# Patient Record
Sex: Male | Born: 1950 | Race: Black or African American | Hispanic: No | Smoking: Current every day smoker
Health system: Southern US, Community
[De-identification: ages and names within clinical notes are randomized; demographics above are authoritative.]

## PROBLEM LIST (undated history)

## (undated) DIAGNOSIS — R569 Unspecified convulsions: Secondary | ICD-10-CM

## (undated) DIAGNOSIS — R0600 Dyspnea, unspecified: Secondary | ICD-10-CM

## (undated) DIAGNOSIS — J449 Chronic obstructive pulmonary disease, unspecified: Secondary | ICD-10-CM

## (undated) DIAGNOSIS — F0390 Unspecified dementia without behavioral disturbance: Secondary | ICD-10-CM

## (undated) DIAGNOSIS — C801 Malignant (primary) neoplasm, unspecified: Secondary | ICD-10-CM

---

## 2003-04-03 ENCOUNTER — Inpatient Hospital Stay (HOSPITAL_COMMUNITY): Admission: EM | Admit: 2003-04-03 | Discharge: 2003-04-06 | Payer: Self-pay | Admitting: Internal Medicine

## 2003-04-03 ENCOUNTER — Encounter: Payer: Self-pay | Admitting: Internal Medicine

## 2004-04-10 ENCOUNTER — Observation Stay (HOSPITAL_COMMUNITY): Admission: EM | Admit: 2004-04-10 | Discharge: 2004-04-11 | Payer: Self-pay | Admitting: Emergency Medicine

## 2007-11-06 ENCOUNTER — Emergency Department (HOSPITAL_COMMUNITY): Admission: EM | Admit: 2007-11-06 | Discharge: 2007-11-06 | Payer: Self-pay | Admitting: Emergency Medicine

## 2008-08-05 ENCOUNTER — Emergency Department (HOSPITAL_COMMUNITY): Admission: EM | Admit: 2008-08-05 | Discharge: 2008-08-05 | Payer: Self-pay | Admitting: Emergency Medicine

## 2008-10-21 ENCOUNTER — Inpatient Hospital Stay (HOSPITAL_COMMUNITY): Admission: EM | Admit: 2008-10-21 | Discharge: 2008-10-31 | Payer: Self-pay | Admitting: Emergency Medicine

## 2009-10-30 IMAGING — US US CAROTID DUPLEX BILAT
1 series · 13 of 24 positions shown · non-contrast
Comparison: None

CLINICAL DATA: RIGHT CAROTID BRUIT, HISTORY SMOKING, CORONARY
ARTERY DISEASE, DIABETES

BILATERAL CAROTID DUPLEX ULTRASOUND
TECHNIQUE: Gray scale imaging, color Doppler and duplex ultrasound
was performed of bilateral carotid systems and vertebral arteries
in the neck.

[Series 1: unknown · 0.06mm/px · 13 of 63 slices shown]
[im 1/63]
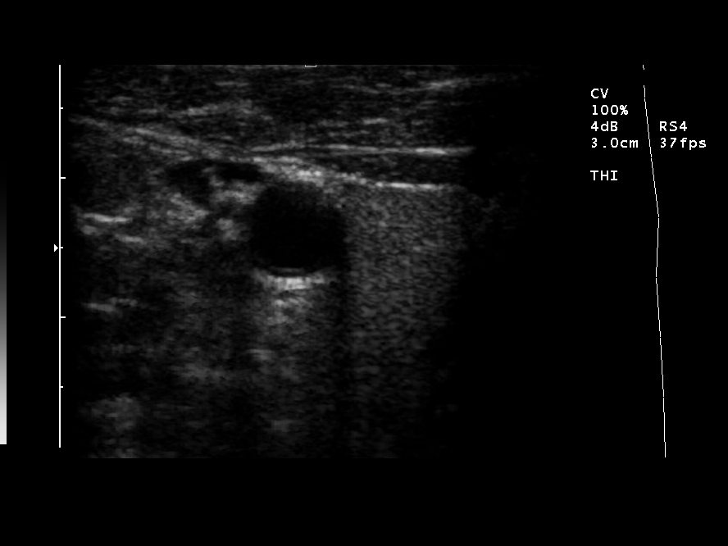
[im 6/63]
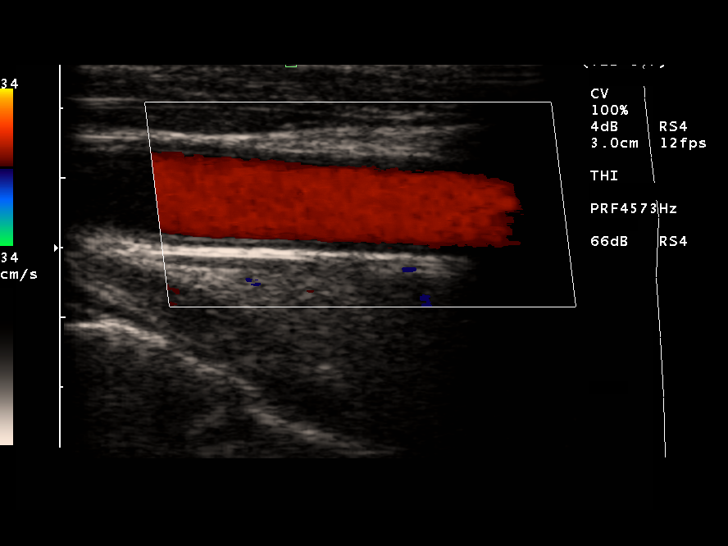
[im 11/63]
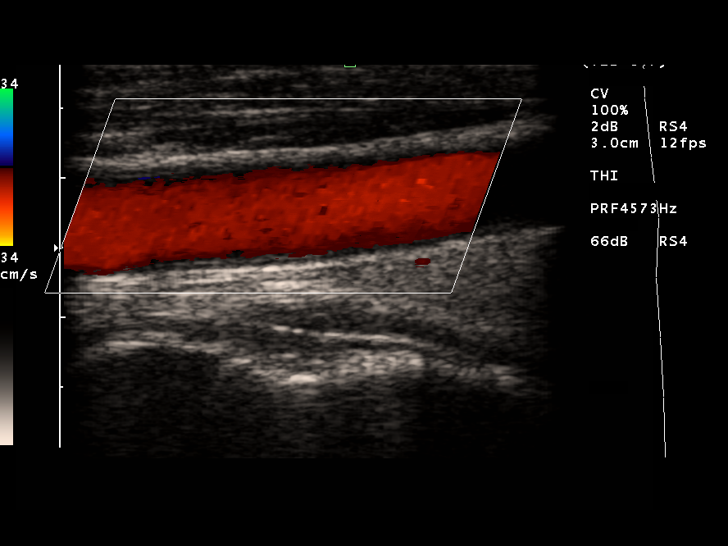
[im 17/63]
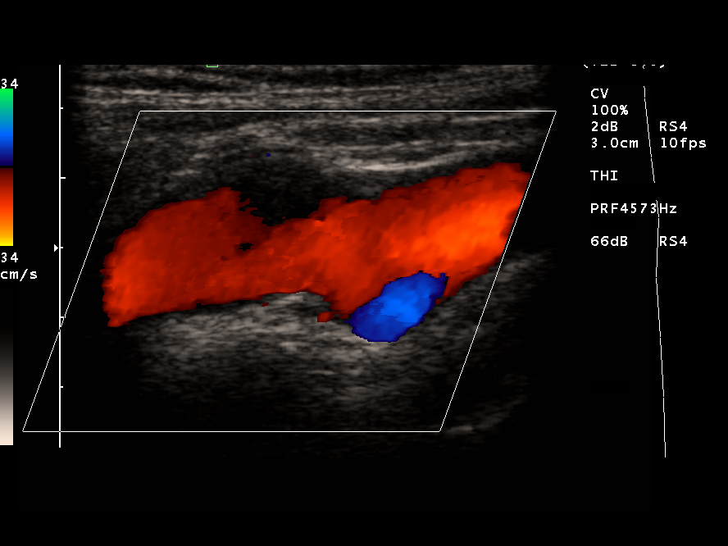
[im 22/63]
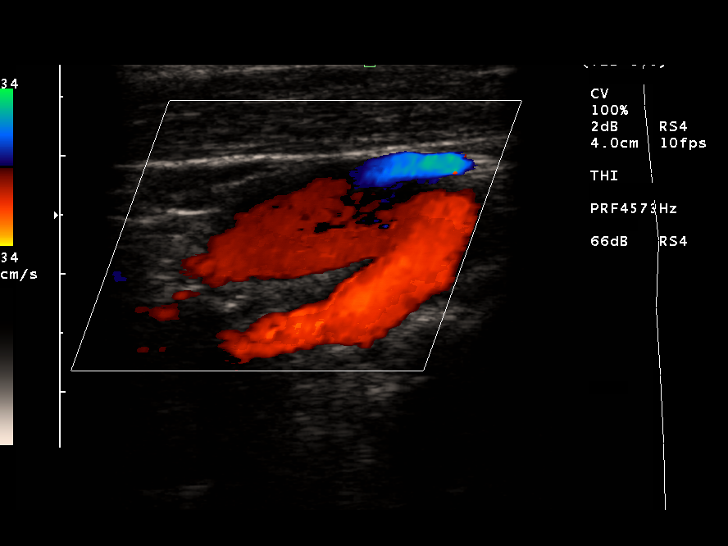
[im 27/63]
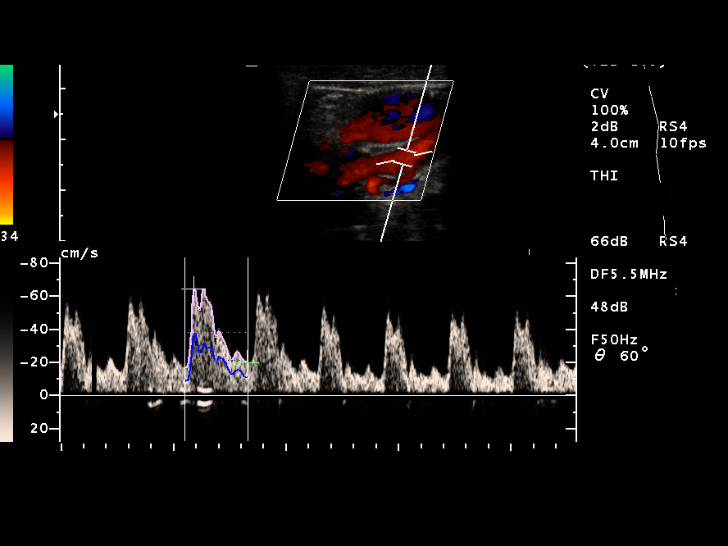
[im 33/63]
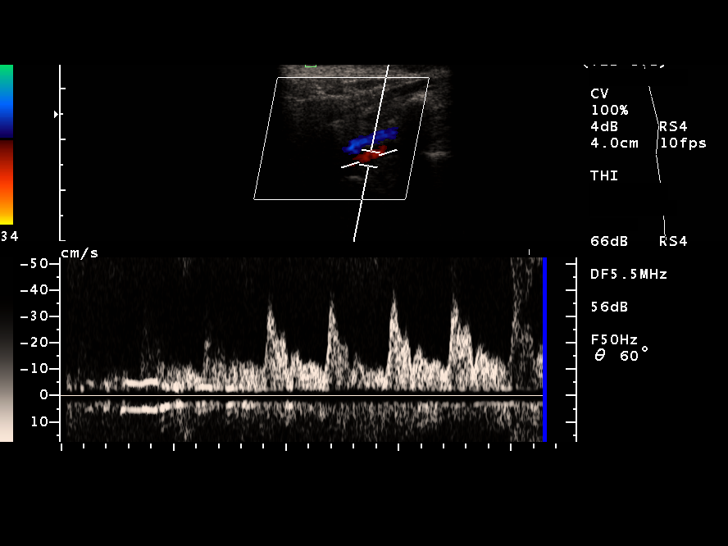
[im 36/63]
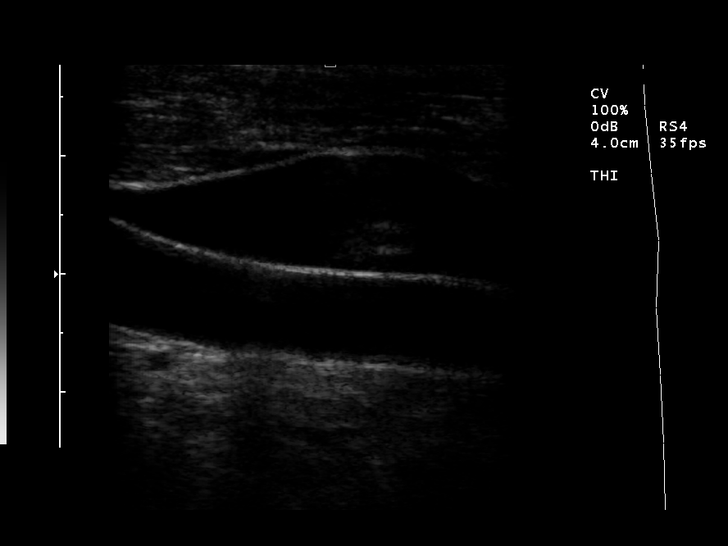
[im 41/63]
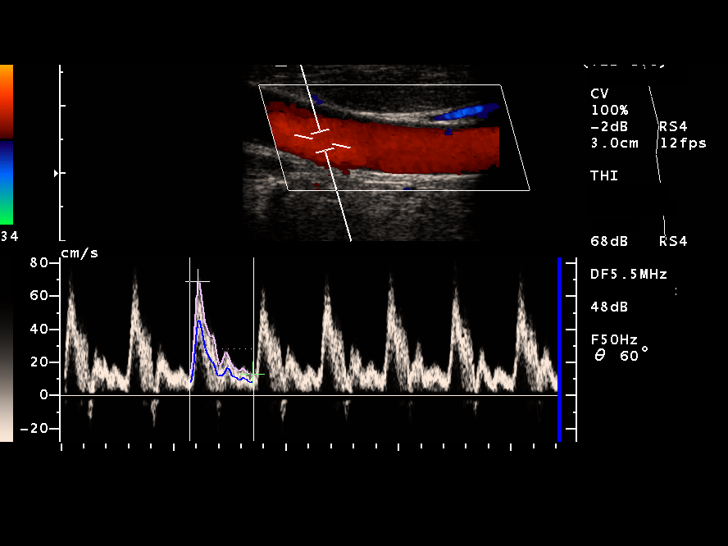
[im 46/63]
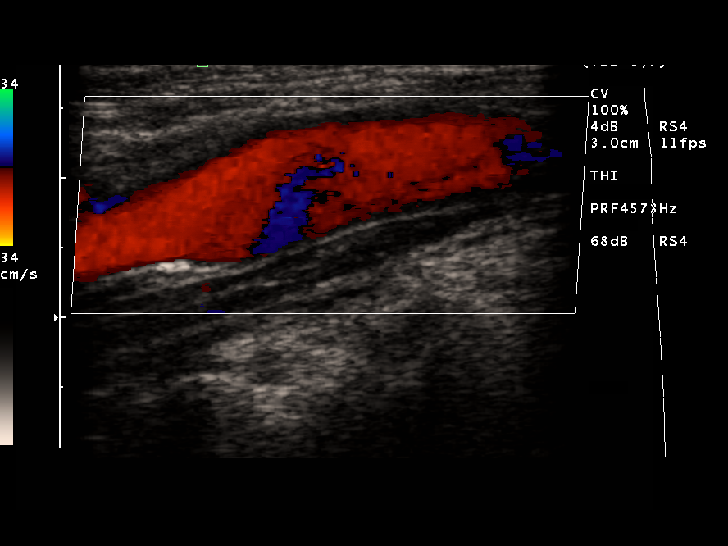
[im 52/63]
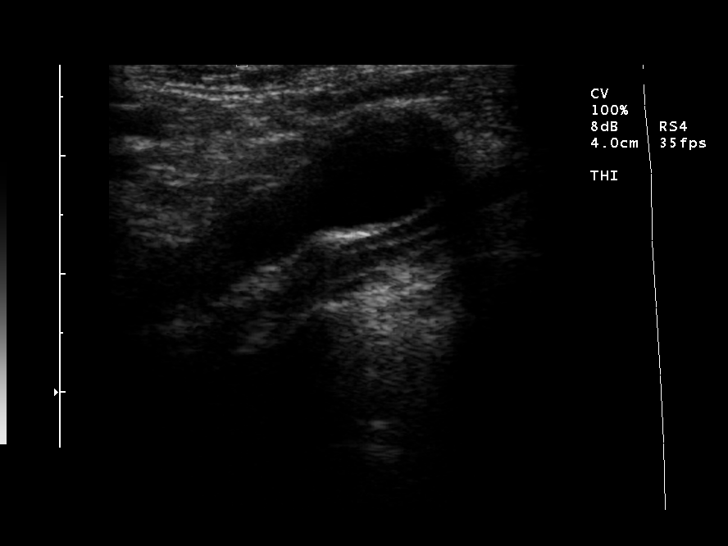
[im 57/63]
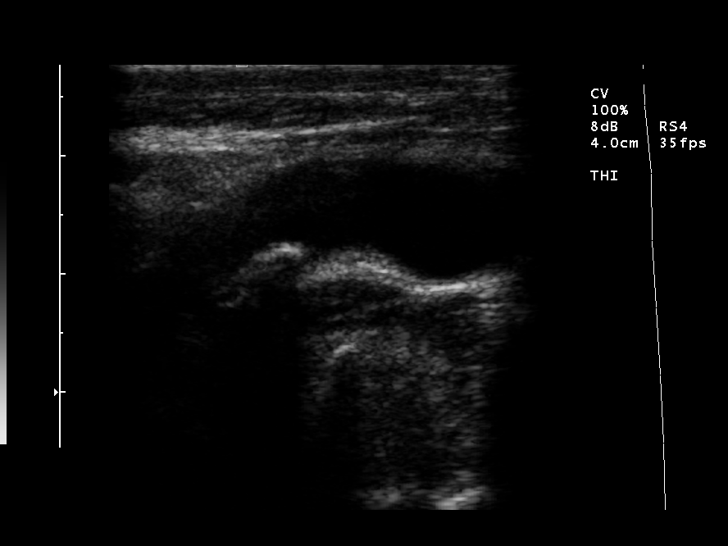
[im 63/63]
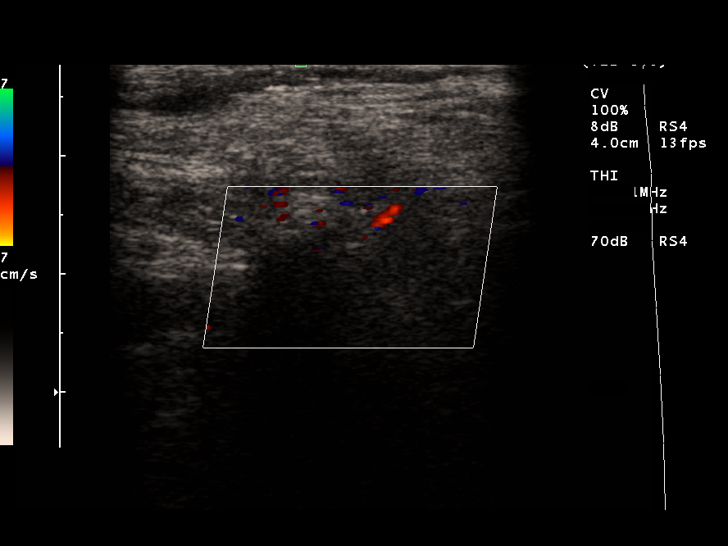

[13 of 24 positions shown; findings below may reference images not displayed]

Criteria:  Quantification of carotid stenosis is based on velocity
parameters that correlate the residual internal carotid diameter
with NASCET-based stenosis levels.

The following velocity measurements were obtained:

                 PEAK SYSTOLIC/END DIASTOLIC
RIGHT
ICA:                                      67/30cm/sec
CCA:                                      74/19cm/sec
SYSTOLIC ICA/CCA RATIO:
DIASTOLIC ICA/CCA RATIO:
ECA:                                      64/20cm/sec

LEFT
ICA:                                      55/18cm/sec
CCA:                                      69/13cm/sec
SYSTOLIC ICA/CCA RATIO:
DIASTOLIC ICA/CCA RATIO:
ECA:                                      61/16cm/sec
FINDINGS: RIGHT CAROTID ARTERY: Minimal plaque right CCA.  Laminar flow
without turbulence or high velocity.

RIGHT VERTEBRAL ARTERY:  Patent, antegrade

LEFT CAROTID ARTERY: Plaque formation at left carotid bulb and
proximal left ICA, portion of which is calcified.  Minimal
turbulence proximal left ICA on waveform analysis with spectral
broadening.  No high velocity jets.

LEFT VERTEBRAL ARTERY:  Patent, antegrade

ADDITIONAL FINDINGS:  None
IMPRESSION: Mild plaque formation left carotid bulb and proximal left ICA, and
minimally in right CCA.
No evidence of hemodynamically significant stenosis.

## 2010-12-20 ENCOUNTER — Emergency Department (HOSPITAL_COMMUNITY): Payer: Medicaid Other

## 2010-12-20 ENCOUNTER — Emergency Department (HOSPITAL_COMMUNITY)
Admission: EM | Admit: 2010-12-20 | Discharge: 2010-12-21 | Disposition: A | Discharge status: Home / Self Care | Payer: Medicaid Other | Attending: Emergency Medicine | Admitting: Emergency Medicine

## 2010-12-20 DIAGNOSIS — S52609A Unspecified fracture of lower end of unspecified ulna, initial encounter for closed fracture: Secondary | ICD-10-CM | POA: Insufficient documentation

## 2010-12-20 DIAGNOSIS — W1789XA Other fall from one level to another, initial encounter: Secondary | ICD-10-CM | POA: Insufficient documentation

## 2010-12-20 DIAGNOSIS — S52509A Unspecified fracture of the lower end of unspecified radius, initial encounter for closed fracture: Secondary | ICD-10-CM | POA: Insufficient documentation

## 2010-12-20 DIAGNOSIS — Y92009 Unspecified place in unspecified non-institutional (private) residence as the place of occurrence of the external cause: Secondary | ICD-10-CM | POA: Insufficient documentation

## 2010-12-25 ENCOUNTER — Observation Stay (HOSPITAL_COMMUNITY)
Admission: AD | Admit: 2010-12-25 | Discharge: 2010-12-26 | Disposition: A | Discharge status: Home / Self Care | Payer: Medicaid Other | Source: Ambulatory Visit | Attending: Orthopaedic Surgery | Admitting: Orthopaedic Surgery

## 2010-12-25 DIAGNOSIS — Y92009 Unspecified place in unspecified non-institutional (private) residence as the place of occurrence of the external cause: Secondary | ICD-10-CM | POA: Insufficient documentation

## 2010-12-25 DIAGNOSIS — S52599A Other fractures of lower end of unspecified radius, initial encounter for closed fracture: Principal | ICD-10-CM | POA: Insufficient documentation

## 2010-12-25 DIAGNOSIS — Z01812 Encounter for preprocedural laboratory examination: Secondary | ICD-10-CM | POA: Insufficient documentation

## 2010-12-25 DIAGNOSIS — F17200 Nicotine dependence, unspecified, uncomplicated: Secondary | ICD-10-CM | POA: Insufficient documentation

## 2010-12-25 DIAGNOSIS — W19XXXA Unspecified fall, initial encounter: Secondary | ICD-10-CM | POA: Insufficient documentation

## 2010-12-25 LAB — CBC
HCT: 33.6 % — ABNORMAL LOW (ref 39.0–52.0)
Hemoglobin: 11.2 g/dL — ABNORMAL LOW (ref 13.0–17.0)
MCH: 33.1 pg (ref 26.0–34.0)
MCHC: 33.3 g/dL (ref 30.0–36.0)
MCV: 99.4 fL (ref 78.0–100.0)
Platelets: 209 10*3/uL (ref 150–400)
RBC: 3.38 MIL/uL — ABNORMAL LOW (ref 4.22–5.81)
RDW: 13.3 % (ref 11.5–15.5)
WBC: 9.9 10*3/uL (ref 4.0–10.5)

## 2010-12-25 LAB — PROTIME-INR
INR: 0.93 (ref 0.00–1.49)
Prothrombin Time: 12.7 seconds (ref 11.6–15.2)

## 2010-12-25 LAB — SURGICAL PCR SCREEN
MRSA, PCR: NEGATIVE
Staphylococcus aureus: NEGATIVE

## 2010-12-29 NOTE — Op Note (Signed)
NAME:  Keith Roberson, BROUILLARD                 ACCOUNT NO.:  [id number]  MEDICAL RECORD NO.:  [id number]           PATIENT TYPE:  I  LOCATION:  5008                         FACILITY:  [facility]  PHYSICIAN:  Vanita Panda. Magnus Ivan, M.D.DATE OF BIRTH:  May 01, 1951  DATE OF PROCEDURE:  12/25/2010 DATE OF DISCHARGE:                              OPERATIVE REPORT   PREOPERATIVE DIAGNOSIS:  Left intra-articular 3-4 part distal radius fracture.  POSTOPERATIVE DIAGNOSES:  Left intra-articular 3-4 part distal radius fracture.  PROCEDURE:  Open reduction and internal fixation of left intra-articular 4-part distal radius fracture.  IMPLANTS:  [employer] distal volar radial locking plate.  SURGEON:  Vanita Panda. Magnus Ivan, MD  ANESTHESIA: 1. General. 2. Local.  TOURNIQUET TIME:  Less than 2 hours.  BLOOD LOSS:  Minimal.  COMPLICATIONS:  None.  ANTIBIOTICS:  1 g IV Ancef.  INDICATIONS:  Mr. Jovel is a 60 year old gentleman who was at a cousin's house wrestling around and he fell on an outstretched left wrist and he had severe left wrist pain and he was seen in [facility] on December 20, 2010.  X-rays obtained showed a comminuted intra- articular distal radius fracture.  Due to the nature of this fracture, he was sent down here from [place] to [place].  He was already placed in the splint, and was recognized the extend of the nature of the this injury.  I recommended he undergo open reduction and internal fixation.  The risks and benefits of the surgery were explained to him in detail and he did wish to proceed with surgery.  PROCEDURE DESCRIPTION:  After informed consent was obtained, appropriate left wrist marked.  He was brought to the operating room and placed supine on the operating table.  General anesthesia was then obtained.  His left arm was placed on radiolucent arm table.  His left arm was prepped and draped with DuraPrep and sterile drapes.  A nonsterile  tourniquet was placed on his upper arm.  A time-out was called to identify the correct patient and correct left wrist.  I then used an Esmarch to wrap out the wrist and the tourniquet was inflated to 250 mmHg of pressure.  I then made an incision of the wrist and took standard volar approach to the wrist and interval between the flexor carpi radialis and radial artery.  These structures were protected as well as the median nerve.  I dissected down to the pronator quadratus and then dissected this off the wrist from a radial to ulnar direction identifying the fracture in significant pieces.  Applying traction and using freer, I was able to tease the fracture and articular surface backed over to a reduced position.  I then placed a [employer] standard distal radial volar locking plate along the volar surface of the distal radius.  I secured this proximally and distally, holding the fracture in place.  Following the fracture reduction and the plate fixation, I put the wrist through range of motion, it was stable.  Ithen copiously irrigated the tissues and closed the subcutaneous tissue with interrupted 2-0 Vicryl suture followed by interrupted 3-0 nylon on  the skin.  I infiltrated the incision with 0.25% plain Sensorcaine. Xeroform followed by well-padded volar plaster short-arm splint were applied.  The patient was awakened, extubated, and taken to the recovery room in stable condition.  All final counts were correct, and there were no complications noted.  Of note, the tourniquet was let down, and his fingers did pinken nicely.     Vanita Panda. Magnus Ivan, M.D.     CYB/MEDQ  D:  12/25/2010  T:  12/26/2010  Job:  [id number]  Electronically Signed by Doneen Poisson M.D. on 12/29/2010 07:17:50 PM

## 2010-12-29 NOTE — H&P (Signed)
  NAME:  TIVON, LEMOINE                 ACCOUNT NO.:  [id number]  MEDICAL RECORD NO.:  [id number]           PATIENT TYPE:  I  LOCATION:  5008                         FACILITY:  [facility]  PHYSICIAN:  Vanita Panda. Magnus Ivan, M.D.DATE OF BIRTH:  [birth year]  DATE OF ADMISSION:  12/25/2010 DATE OF DISCHARGE:                             HISTORY & PHYSICAL   CHIEF COMPLAINT:  Left wrist pain with known left distal radius fracture.  HISTORY OF PRESENT ILLNESS:  Mr. Wolfson is a 60 year old gentleman who is right-hand dominant.  He fell on his outstretched left wrist, sometime near around the December 20, 2010.  X-rays were obtained in the [facility] Emergency Room, and was found to have an intra-articular displaced and  unstable distal radius fracture.  He was then seen in Dr. Sanjuan Dame office.  He was placed appropriately in a splint and given followup in our office per Dr. Sanjuan Dame request.  I did see Mr. Beagle in the clinic today, and noticed unstable nature of this fracture, and recommended he undergo an open reduction and internal fixation today of the fracture.  I spent some time explaining the risks and benefits of the surgery like this and how we fix this with plating.  He did understand this and did wish to proceed with surgery.  PAST MEDICAL HISTORY:  Seizure disorder.  MEDICATIONS:  Dilantin.  ALLERGIES:  No known drug allergies.  SOCIAL HISTORY:  He does smoke.  He does not work due to his seizure disorder.  His family is with him.  REVIEW OF SYSTEMS:  Negative for chest pain, shortness of breath, fever, chills, nausea, or vomiting.  PHYSICAL EXAMINATION:  VITAL SIGNS:  He is afebrile with stable vital signs. GENERAL:  He is alert and oriented x3, in minimal discomfort. HEENT:  Normocephalic and atraumatic.  Pupils equal, round, and reactive to light. NECK:  Supple. LUNGS:  Clear to auscultation bilaterally. HEART:  Regular rate and rhythm. ABDOMEN:  Benign. EXTREMITIES:   Left wrist and hand shows severe swelling. SKIN:  Intact.  He has some slight decreased sensation in the median nerve distribution.  X-rays on the [facility] from [facility] showed severely displaced distal radius fracture.  ASSESSMENT:  This is a 60 year old gentleman with severely displaced unstable left distal radius fracture.  PLAN:  We will proceed to the operating room today for open reduction and internal fixation of this fracture with likely volar plates.  He understands this and we will admit him for observation, IV pain medicines, and antibiotics with discharge on tomorrow.     Vanita Panda. Magnus Ivan, M.D.     CYB/MEDQ  D:  12/25/2010  T:  12/26/2010  Job:  [id number]  Electronically Signed by Doneen Poisson M.D. on 12/29/2010 07:17:47 PM

## 2010-12-29 NOTE — Discharge Summary (Signed)
  NAME:  Keith Roberson, Keith Roberson                 ACCOUNT NO.:  [id number]  MEDICAL RECORD NO.:  [id number]           PATIENT TYPE:  I  LOCATION:  5008                         FACILITY:  [facility]  PHYSICIAN:  Vanita Panda. Magnus Ivan, M.D.DATE OF BIRTH:  [birth year]  DATE OF ADMISSION:  12/25/2010 DATE OF DISCHARGE:  12/26/2010                              DISCHARGE SUMMARY   ADMITTING DIAGNOSIS:  Left intraarticular displaced distal radius fracture.  DISCHARGE DIAGNOSIS:  Left displaced intraarticular distal radius fracture.  PROCEDURE:  Open reduction and internal fixation of left distal radius fracture on December 25, 2010.  HOSPITAL COURSE:  [name] is a 60 year old gentleman who sustained a mechanical fall.  He was found to have a comminuted intraarticular distal radius fracture of the left wrist.  Given the unstable nature of this fracture, it was recommended that he undergo open reduction and internal fixation of the fracture.  He was taken to the operating room on the day of admission where he underwent tight fixation of the fracture.  He was then admitted overnight.  Postoperative day #1, he was afebrile with stable vital signs.  Wrist incision was clean, dry, and intact.  His hand was well-perfused and it was felt he could be discharged safely to home.  DISPOSITION:  Discharged to home.  DISCHARGE INSTRUCTIONS:  While he is at home, he can avoid to get his incision wet in shower for about 5 days.  He will be in a Velcro wrist splint doing all range of motion of his wrist as well.  A followup appointment should be established in 2 weeks in my office.  DISCHARGE MEDICATIONS:  Norco as needed for pain.     Vanita Panda. Magnus Ivan, M.D.     CYB/MEDQ  D:  12/26/2010  T:  12/26/2010  Job:  [id number]  Electronically Signed by Doneen Poisson M.D. on 12/29/2010 07:17:53 PM

## 2011-02-04 LAB — GLUCOSE, CAPILLARY
Glucose-Capillary: 100 mg/dL — ABNORMAL HIGH (ref 70–99)
Glucose-Capillary: 101 mg/dL — ABNORMAL HIGH (ref 70–99)
Glucose-Capillary: 101 mg/dL — ABNORMAL HIGH (ref 70–99)
Glucose-Capillary: 102 mg/dL — ABNORMAL HIGH (ref 70–99)
Glucose-Capillary: 102 mg/dL — ABNORMAL HIGH (ref 70–99)
Glucose-Capillary: 103 mg/dL — ABNORMAL HIGH (ref 70–99)
Glucose-Capillary: 104 mg/dL — ABNORMAL HIGH (ref 70–99)
Glucose-Capillary: 106 mg/dL — ABNORMAL HIGH (ref 70–99)
Glucose-Capillary: 106 mg/dL — ABNORMAL HIGH (ref 70–99)
Glucose-Capillary: 108 mg/dL — ABNORMAL HIGH (ref 70–99)
Glucose-Capillary: 108 mg/dL — ABNORMAL HIGH (ref 70–99)
Glucose-Capillary: 111 mg/dL — ABNORMAL HIGH (ref 70–99)
Glucose-Capillary: 113 mg/dL — ABNORMAL HIGH (ref 70–99)
Glucose-Capillary: 114 mg/dL — ABNORMAL HIGH (ref 70–99)
Glucose-Capillary: 121 mg/dL — ABNORMAL HIGH (ref 70–99)
Glucose-Capillary: 122 mg/dL — ABNORMAL HIGH (ref 70–99)
Glucose-Capillary: 125 mg/dL — ABNORMAL HIGH (ref 70–99)
Glucose-Capillary: 141 mg/dL — ABNORMAL HIGH (ref 70–99)
Glucose-Capillary: 166 mg/dL — ABNORMAL HIGH (ref 70–99)
Glucose-Capillary: 173 mg/dL — ABNORMAL HIGH (ref 70–99)
Glucose-Capillary: 174 mg/dL — ABNORMAL HIGH (ref 70–99)
Glucose-Capillary: 194 mg/dL — ABNORMAL HIGH (ref 70–99)
Glucose-Capillary: 207 mg/dL — ABNORMAL HIGH (ref 70–99)
Glucose-Capillary: 234 mg/dL — ABNORMAL HIGH (ref 70–99)
Glucose-Capillary: 255 mg/dL — ABNORMAL HIGH (ref 70–99)
Glucose-Capillary: 39 mg/dL — CL (ref 70–99)
Glucose-Capillary: 45 mg/dL — ABNORMAL LOW (ref 70–99)
Glucose-Capillary: 53 mg/dL — ABNORMAL LOW (ref 70–99)
Glucose-Capillary: 54 mg/dL — ABNORMAL LOW (ref 70–99)
Glucose-Capillary: 63 mg/dL — ABNORMAL LOW (ref 70–99)
Glucose-Capillary: 67 mg/dL — ABNORMAL LOW (ref 70–99)
Glucose-Capillary: 67 mg/dL — ABNORMAL LOW (ref 70–99)
Glucose-Capillary: 67 mg/dL — ABNORMAL LOW (ref 70–99)
Glucose-Capillary: 69 mg/dL — ABNORMAL LOW (ref 70–99)
Glucose-Capillary: 70 mg/dL (ref 70–99)
Glucose-Capillary: 71 mg/dL (ref 70–99)
Glucose-Capillary: 75 mg/dL (ref 70–99)
Glucose-Capillary: 75 mg/dL (ref 70–99)
Glucose-Capillary: 75 mg/dL (ref 70–99)
Glucose-Capillary: 75 mg/dL (ref 70–99)
Glucose-Capillary: 77 mg/dL (ref 70–99)
Glucose-Capillary: 79 mg/dL (ref 70–99)
Glucose-Capillary: 79 mg/dL (ref 70–99)
Glucose-Capillary: 83 mg/dL (ref 70–99)
Glucose-Capillary: 83 mg/dL (ref 70–99)
Glucose-Capillary: 84 mg/dL (ref 70–99)
Glucose-Capillary: 85 mg/dL (ref 70–99)
Glucose-Capillary: 86 mg/dL (ref 70–99)
Glucose-Capillary: 86 mg/dL (ref 70–99)
Glucose-Capillary: 89 mg/dL (ref 70–99)
Glucose-Capillary: 89 mg/dL (ref 70–99)
Glucose-Capillary: 90 mg/dL (ref 70–99)
Glucose-Capillary: 93 mg/dL (ref 70–99)
Glucose-Capillary: 94 mg/dL (ref 70–99)
Glucose-Capillary: 94 mg/dL (ref 70–99)
Glucose-Capillary: 94 mg/dL (ref 70–99)
Glucose-Capillary: 95 mg/dL (ref 70–99)
Glucose-Capillary: 96 mg/dL (ref 70–99)
Glucose-Capillary: 99 mg/dL (ref 70–99)

## 2011-02-04 LAB — DIFFERENTIAL
Basophils Absolute: 0 10*3/uL (ref 0.0–0.1)
Basophils Absolute: 0 10*3/uL (ref 0.0–0.1)
Basophils Absolute: 0 10*3/uL (ref 0.0–0.1)
Basophils Absolute: 0 10*3/uL (ref 0.0–0.1)
Basophils Absolute: 0 10*3/uL (ref 0.0–0.1)
Basophils Absolute: 0 10*3/uL (ref 0.0–0.1)
Basophils Absolute: 0 10*3/uL (ref 0.0–0.1)
Basophils Relative: 0 % (ref 0–1)
Basophils Relative: 0 % (ref 0–1)
Basophils Relative: 0 % (ref 0–1)
Basophils Relative: 0 % (ref 0–1)
Basophils Relative: 0 % (ref 0–1)
Basophils Relative: 0 % (ref 0–1)
Basophils Relative: 1 % (ref 0–1)
Eosinophils Absolute: 0 10*3/uL (ref 0.0–0.7)
Eosinophils Absolute: 0.1 10*3/uL (ref 0.0–0.7)
Eosinophils Absolute: 0.2 10*3/uL (ref 0.0–0.7)
Eosinophils Absolute: 0.2 10*3/uL (ref 0.0–0.7)
Eosinophils Absolute: 0.2 10*3/uL (ref 0.0–0.7)
Eosinophils Absolute: 0.2 10*3/uL (ref 0.0–0.7)
Eosinophils Absolute: 0.3 10*3/uL (ref 0.0–0.7)
Eosinophils Relative: 0 % (ref 0–5)
Eosinophils Relative: 1 % (ref 0–5)
Eosinophils Relative: 2 % (ref 0–5)
Eosinophils Relative: 2 % (ref 0–5)
Eosinophils Relative: 2 % (ref 0–5)
Eosinophils Relative: 3 % (ref 0–5)
Eosinophils Relative: 3 % (ref 0–5)
Lymphocytes Relative: 21 % (ref 12–46)
Lymphocytes Relative: 27 % (ref 12–46)
Lymphocytes Relative: 28 % (ref 12–46)
Lymphocytes Relative: 29 % (ref 12–46)
Lymphocytes Relative: 31 % (ref 12–46)
Lymphocytes Relative: 32 % (ref 12–46)
Lymphocytes Relative: 32 % (ref 12–46)
Lymphs Abs: 2.1 10*3/uL (ref 0.7–4.0)
Lymphs Abs: 2.2 10*3/uL (ref 0.7–4.0)
Lymphs Abs: 2.3 10*3/uL (ref 0.7–4.0)
Lymphs Abs: 2.3 10*3/uL (ref 0.7–4.0)
Lymphs Abs: 2.3 10*3/uL (ref 0.7–4.0)
Lymphs Abs: 2.5 10*3/uL (ref 0.7–4.0)
Lymphs Abs: 2.8 10*3/uL (ref 0.7–4.0)
Monocytes Absolute: 0.4 10*3/uL (ref 0.1–1.0)
Monocytes Absolute: 0.5 10*3/uL (ref 0.1–1.0)
Monocytes Absolute: 0.7 10*3/uL (ref 0.1–1.0)
Monocytes Absolute: 0.7 10*3/uL (ref 0.1–1.0)
Monocytes Absolute: 0.7 10*3/uL (ref 0.1–1.0)
Monocytes Absolute: 0.8 10*3/uL (ref 0.1–1.0)
Monocytes Absolute: 0.8 10*3/uL (ref 0.1–1.0)
Monocytes Relative: 10 % (ref 3–12)
Monocytes Relative: 11 % (ref 3–12)
Monocytes Relative: 4 % (ref 3–12)
Monocytes Relative: 6 % (ref 3–12)
Monocytes Relative: 8 % (ref 3–12)
Monocytes Relative: 8 % (ref 3–12)
Monocytes Relative: 9 % (ref 3–12)
Neutro Abs: 3.8 10*3/uL (ref 1.7–7.7)
Neutro Abs: 4.4 10*3/uL (ref 1.7–7.7)
Neutro Abs: 4.5 10*3/uL (ref 1.7–7.7)
Neutro Abs: 4.9 10*3/uL (ref 1.7–7.7)
Neutro Abs: 5 10*3/uL (ref 1.7–7.7)
Neutro Abs: 6.9 10*3/uL (ref 1.7–7.7)
Neutro Abs: 7.1 10*3/uL (ref 1.7–7.7)
Neutrophils Relative %: 55 % (ref 43–77)
Neutrophils Relative %: 56 % (ref 43–77)
Neutrophils Relative %: 60 % (ref 43–77)
Neutrophils Relative %: 60 % (ref 43–77)
Neutrophils Relative %: 61 % (ref 43–77)
Neutrophils Relative %: 68 % (ref 43–77)
Neutrophils Relative %: 69 % (ref 43–77)

## 2011-02-04 LAB — POCT CARDIAC MARKERS
CKMB, poc: 1.8 ng/mL (ref 1.0–8.0)
Myoglobin, poc: 336 ng/mL (ref 12–200)
Troponin i, poc: <0.05 ng/mL (ref 0.00–0.09)

## 2011-02-04 LAB — VITAMIN B12: Vitamin B-12: 318 pg/mL (ref 211–911)

## 2011-02-04 LAB — CROSSMATCH
ABO/RH(D): B POS
Antibody Screen: NEGATIVE

## 2011-02-04 LAB — CBC
HCT: 22.1 % — ABNORMAL LOW (ref 39.0–52.0)
HCT: 27.5 % — ABNORMAL LOW (ref 39.0–52.0)
HCT: 29.4 % — ABNORMAL LOW (ref 39.0–52.0)
HCT: 32 % — ABNORMAL LOW (ref 39.0–52.0)
HCT: 33.7 % — ABNORMAL LOW (ref 39.0–52.0)
HCT: 37.7 % — ABNORMAL LOW (ref 39.0–52.0)
HCT: 42.2 % (ref 39.0–52.0)
Hemoglobin: 10.7 g/dL — ABNORMAL LOW (ref 13.0–17.0)
Hemoglobin: 11.1 g/dL — ABNORMAL LOW (ref 13.0–17.0)
Hemoglobin: 12.6 g/dL — ABNORMAL LOW (ref 13.0–17.0)
Hemoglobin: 13.7 g/dL (ref 13.0–17.0)
Hemoglobin: 7.4 g/dL — CL (ref 13.0–17.0)
Hemoglobin: 9 g/dL — ABNORMAL LOW (ref 13.0–17.0)
Hemoglobin: 9.9 g/dL — ABNORMAL LOW (ref 13.0–17.0)
MCHC: 32.5 g/dL (ref 30.0–36.0)
MCHC: 32.6 g/dL (ref 30.0–36.0)
MCHC: 33 g/dL (ref 30.0–36.0)
MCHC: 33.3 g/dL (ref 30.0–36.0)
MCHC: 33.3 g/dL (ref 30.0–36.0)
MCHC: 33.5 g/dL (ref 30.0–36.0)
MCHC: 33.8 g/dL (ref 30.0–36.0)
MCV: 108.2 fL — ABNORMAL HIGH (ref 78.0–100.0)
MCV: 109.1 fL — ABNORMAL HIGH (ref 78.0–100.0)
MCV: 109.7 fL — ABNORMAL HIGH (ref 78.0–100.0)
MCV: 109.9 fL — ABNORMAL HIGH (ref 78.0–100.0)
MCV: 110.6 fL — ABNORMAL HIGH (ref 78.0–100.0)
MCV: 110.6 fL — ABNORMAL HIGH (ref 78.0–100.0)
MCV: 113.6 fL — ABNORMAL HIGH (ref 78.0–100.0)
Platelets: 170 10*3/uL (ref 150–400)
Platelets: 192 10*3/uL (ref 150–400)
Platelets: 194 10*3/uL (ref 150–400)
Platelets: 210 10*3/uL (ref 150–400)
Platelets: 210 10*3/uL (ref 150–400)
Platelets: 218 10*3/uL (ref 150–400)
Platelets: 252 10*3/uL (ref 150–400)
RBC: 2.04 MIL/uL — ABNORMAL LOW (ref 4.22–5.81)
RBC: 2.51 MIL/uL — ABNORMAL LOW (ref 4.22–5.81)
RBC: 2.69 MIL/uL — ABNORMAL LOW (ref 4.22–5.81)
RBC: 2.89 MIL/uL — ABNORMAL LOW (ref 4.22–5.81)
RBC: 3.07 MIL/uL — ABNORMAL LOW (ref 4.22–5.81)
RBC: 3.41 MIL/uL — ABNORMAL LOW (ref 4.22–5.81)
RBC: 3.72 MIL/uL — ABNORMAL LOW (ref 4.22–5.81)
RDW: 15.5 % (ref 11.5–15.5)
RDW: 15.7 % — ABNORMAL HIGH (ref 11.5–15.5)
RDW: 15.8 % — ABNORMAL HIGH (ref 11.5–15.5)
RDW: 16.2 % — ABNORMAL HIGH (ref 11.5–15.5)
RDW: 16.3 % — ABNORMAL HIGH (ref 11.5–15.5)
RDW: 16.4 % — ABNORMAL HIGH (ref 11.5–15.5)
RDW: 16.4 % — ABNORMAL HIGH (ref 11.5–15.5)
WBC: 10.1 10*3/uL (ref 4.0–10.5)
WBC: 10.2 10*3/uL (ref 4.0–10.5)
WBC: 6.8 10*3/uL (ref 4.0–10.5)
WBC: 7.5 10*3/uL (ref 4.0–10.5)
WBC: 7.9 10*3/uL (ref 4.0–10.5)
WBC: 8.1 10*3/uL (ref 4.0–10.5)
WBC: 8.2 10*3/uL (ref 4.0–10.5)

## 2011-02-04 LAB — COMPREHENSIVE METABOLIC PANEL
ALT: 11 U/L (ref 0–53)
ALT: 12 U/L (ref 0–53)
ALT: 12 U/L (ref 0–53)
ALT: 9 U/L (ref 0–53)
AST: 15 U/L (ref 0–37)
AST: 25 U/L (ref 0–37)
AST: 26 U/L (ref 0–37)
AST: 33 U/L (ref 0–37)
Albumin: 2.4 g/dL — ABNORMAL LOW (ref 3.5–5.2)
Albumin: 2.4 g/dL — ABNORMAL LOW (ref 3.5–5.2)
Albumin: 2.7 g/dL — ABNORMAL LOW (ref 3.5–5.2)
Albumin: 3.4 g/dL — ABNORMAL LOW (ref 3.5–5.2)
Alkaline Phosphatase: 56 U/L (ref 39–117)
Alkaline Phosphatase: 64 U/L (ref 39–117)
Alkaline Phosphatase: 86 U/L (ref 39–117)
Alkaline Phosphatase: 90 U/L (ref 39–117)
BUN: 10 mg/dL (ref 6–23)
BUN: 14 mg/dL (ref 6–23)
BUN: 2 mg/dL — ABNORMAL LOW (ref 6–23)
BUN: 9 mg/dL (ref 6–23)
CO2: 19 mEq/L (ref 19–32)
CO2: 24 mEq/L (ref 19–32)
CO2: 24 mEq/L (ref 19–32)
CO2: 26 mEq/L (ref 19–32)
Calcium: 8 mg/dL — ABNORMAL LOW (ref 8.4–10.5)
Calcium: 8.4 mg/dL (ref 8.4–10.5)
Calcium: 8.7 mg/dL (ref 8.4–10.5)
Calcium: 9.1 mg/dL (ref 8.4–10.5)
Chloride: 105 mEq/L (ref 96–112)
Chloride: 106 mEq/L (ref 96–112)
Chloride: 107 mEq/L (ref 96–112)
Chloride: 110 mEq/L (ref 96–112)
Creatinine, Ser: 0.68 mg/dL (ref 0.4–1.5)
Creatinine, Ser: 0.73 mg/dL (ref 0.4–1.5)
Creatinine, Ser: 0.8 mg/dL (ref 0.4–1.5)
Creatinine, Ser: 0.89 mg/dL (ref 0.4–1.5)
GFR calc Af Amer: >60 mL/min (ref >60)
GFR calc Af Amer: >60 mL/min (ref >60)
GFR calc Af Amer: >60 mL/min (ref >60)
GFR calc Af Amer: >60 mL/min (ref >60)
GFR calc non Af Amer: >60 mL/min (ref >60)
GFR calc non Af Amer: >60 mL/min (ref >60)
GFR calc non Af Amer: >60 mL/min (ref >60)
GFR calc non Af Amer: >60 mL/min (ref >60)
Glucose, Bld: 126 mg/dL — ABNORMAL HIGH (ref 70–99)
Glucose, Bld: 244 mg/dL — ABNORMAL HIGH (ref 70–99)
Glucose, Bld: 82 mg/dL (ref 70–99)
Glucose, Bld: 96 mg/dL (ref 70–99)
Potassium: 3.6 mEq/L (ref 3.5–5.1)
Potassium: 3.8 mEq/L (ref 3.5–5.1)
Potassium: 3.9 mEq/L (ref 3.5–5.1)
Potassium: 4.2 mEq/L (ref 3.5–5.1)
Sodium: 135 mEq/L (ref 135–145)
Sodium: 137 mEq/L (ref 135–145)
Sodium: 139 mEq/L (ref 135–145)
Sodium: 140 mEq/L (ref 135–145)
Total Bilirubin: 0.4 mg/dL (ref 0.3–1.2)
Total Bilirubin: 0.4 mg/dL (ref 0.3–1.2)
Total Bilirubin: 0.5 mg/dL (ref 0.3–1.2)
Total Bilirubin: 0.5 mg/dL (ref 0.3–1.2)
Total Protein: 4.6 g/dL — ABNORMAL LOW (ref 6.0–8.3)
Total Protein: 5 g/dL — ABNORMAL LOW (ref 6.0–8.3)
Total Protein: 5.7 g/dL — ABNORMAL LOW (ref 6.0–8.3)
Total Protein: 6.8 g/dL (ref 6.0–8.3)

## 2011-02-04 LAB — BASIC METABOLIC PANEL
BUN: 10 mg/dL (ref 6–23)
BUN: 8 mg/dL (ref 6–23)
BUN: 9 mg/dL (ref 6–23)
BUN: 9 mg/dL (ref 6–23)
CO2: 26 mEq/L (ref 19–32)
CO2: 27 mEq/L (ref 19–32)
CO2: 29 mEq/L (ref 19–32)
CO2: 29 mEq/L (ref 19–32)
Calcium: 8.4 mg/dL (ref 8.4–10.5)
Calcium: 8.6 mg/dL (ref 8.4–10.5)
Calcium: 9 mg/dL (ref 8.4–10.5)
Calcium: 9.1 mg/dL (ref 8.4–10.5)
Chloride: 101 mEq/L (ref 96–112)
Chloride: 106 mEq/L (ref 96–112)
Chloride: 106 mEq/L (ref 96–112)
Chloride: 108 mEq/L (ref 96–112)
Creatinine, Ser: 0.71 mg/dL (ref 0.4–1.5)
Creatinine, Ser: 0.78 mg/dL (ref 0.4–1.5)
Creatinine, Ser: 0.79 mg/dL (ref 0.4–1.5)
Creatinine, Ser: 0.86 mg/dL (ref 0.4–1.5)
GFR calc Af Amer: >60 mL/min (ref >60)
GFR calc Af Amer: >60 mL/min (ref >60)
GFR calc Af Amer: >60 mL/min (ref >60)
GFR calc Af Amer: >60 mL/min (ref >60)
GFR calc non Af Amer: >60 mL/min (ref >60)
GFR calc non Af Amer: >60 mL/min (ref >60)
GFR calc non Af Amer: >60 mL/min (ref >60)
GFR calc non Af Amer: >60 mL/min (ref >60)
Glucose, Bld: 80 mg/dL (ref 70–99)
Glucose, Bld: 80 mg/dL (ref 70–99)
Glucose, Bld: 91 mg/dL (ref 70–99)
Glucose, Bld: 95 mg/dL (ref 70–99)
Potassium: 3.8 mEq/L (ref 3.5–5.1)
Potassium: 3.9 mEq/L (ref 3.5–5.1)
Potassium: 4 mEq/L (ref 3.5–5.1)
Potassium: 4.1 mEq/L (ref 3.5–5.1)
Sodium: 135 mEq/L (ref 135–145)
Sodium: 138 mEq/L (ref 135–145)
Sodium: 138 mEq/L (ref 135–145)
Sodium: 140 mEq/L (ref 135–145)

## 2011-02-04 LAB — PHOSPHORUS: Phosphorus: 3.7 mg/dL (ref 2.3–4.6)

## 2011-02-04 LAB — HEMOGLOBIN AND HEMATOCRIT, BLOOD
HCT: 25.5 % — ABNORMAL LOW (ref 39.0–52.0)
HCT: 26.6 % — ABNORMAL LOW (ref 39.0–52.0)
HCT: 26.7 % — ABNORMAL LOW (ref 39.0–52.0)
HCT: 27.3 % — ABNORMAL LOW (ref 39.0–52.0)
HCT: 27.4 % — ABNORMAL LOW (ref 39.0–52.0)
HCT: 27.4 % — ABNORMAL LOW (ref 39.0–52.0)
HCT: 27.7 % — ABNORMAL LOW (ref 39.0–52.0)
HCT: 28.5 % — ABNORMAL LOW (ref 39.0–52.0)
HCT: 29.2 % — ABNORMAL LOW (ref 39.0–52.0)
HCT: 29.8 % — ABNORMAL LOW (ref 39.0–52.0)
HCT: 34.4 % — ABNORMAL LOW (ref 39.0–52.0)
Hemoglobin: 11.5 g/dL — ABNORMAL LOW (ref 13.0–17.0)
Hemoglobin: 8.5 g/dL — ABNORMAL LOW (ref 13.0–17.0)
Hemoglobin: 8.8 g/dL — ABNORMAL LOW (ref 13.0–17.0)
Hemoglobin: 8.9 g/dL — ABNORMAL LOW (ref 13.0–17.0)
Hemoglobin: 9 g/dL — ABNORMAL LOW (ref 13.0–17.0)
Hemoglobin: 9 g/dL — ABNORMAL LOW (ref 13.0–17.0)
Hemoglobin: 9.1 g/dL — ABNORMAL LOW (ref 13.0–17.0)
Hemoglobin: 9.1 g/dL — ABNORMAL LOW (ref 13.0–17.0)
Hemoglobin: 9.5 g/dL — ABNORMAL LOW (ref 13.0–17.0)
Hemoglobin: 9.6 g/dL — ABNORMAL LOW (ref 13.0–17.0)
Hemoglobin: 9.9 g/dL — ABNORMAL LOW (ref 13.0–17.0)

## 2011-02-04 LAB — URINALYSIS, ROUTINE W REFLEX MICROSCOPIC
Bilirubin Urine: NEGATIVE
Bilirubin Urine: NEGATIVE
Glucose, UA: 250 mg/dL — AB
Glucose, UA: NEGATIVE mg/dL
Hgb urine dipstick: NEGATIVE
Ketones, ur: 15 mg/dL — AB
Ketones, ur: NEGATIVE mg/dL
Leukocytes, UA: NEGATIVE
Nitrite: NEGATIVE
Nitrite: NEGATIVE
Protein, ur: NEGATIVE mg/dL
Protein, ur: NEGATIVE mg/dL
Specific Gravity, Urine: 1.01 (ref 1.005–1.030)
Specific Gravity, Urine: >1.03 — ABNORMAL HIGH (ref 1.005–1.030)
Urobilinogen, UA: 0.2 mg/dL (ref 0.0–1.0)
Urobilinogen, UA: 0.2 mg/dL (ref 0.0–1.0)
pH: 5 (ref 5.0–8.0)
pH: 5.5 (ref 5.0–8.0)

## 2011-02-04 LAB — CULTURE, BLOOD (ROUTINE X 2)
Culture: NO GROWTH
Culture: NO GROWTH
Report Status: 1072010
Report Status: 1072010

## 2011-02-04 LAB — PROTIME-INR
INR: 1 (ref 0.00–1.49)
INR: 1 (ref 0.00–1.49)
INR: 1.1 (ref 0.00–1.49)
Prothrombin Time: 13 seconds (ref 11.6–15.2)
Prothrombin Time: 13.2 seconds (ref 11.6–15.2)
Prothrombin Time: 14.7 seconds (ref 11.6–15.2)

## 2011-02-04 LAB — URINE MICROSCOPIC-ADD ON

## 2011-02-04 LAB — CK TOTAL AND CKMB (NOT AT ARMC)
CK, MB: 3 ng/mL (ref 0.3–4.0)
Relative Index: INVALID (ref 0.0–2.5)
Total CK: 98 U/L (ref 7–232)

## 2011-02-04 LAB — RAPID URINE DRUG SCREEN, HOSP PERFORMED
Amphetamines: NOT DETECTED
Barbiturates: NOT DETECTED
Benzodiazepines: NOT DETECTED
Cocaine: NOT DETECTED
Opiates: NOT DETECTED
Tetrahydrocannabinol: NOT DETECTED

## 2011-02-04 LAB — ABO/RH: ABO/RH(D): B POS

## 2011-02-04 LAB — MAGNESIUM
Magnesium: 1.6 mg/dL (ref 1.5–2.5)
Magnesium: 1.6 mg/dL (ref 1.5–2.5)
Magnesium: 2 mg/dL (ref 1.5–2.5)

## 2011-02-04 LAB — LIPID PANEL
Cholesterol: 155 mg/dL (ref 0–200)
HDL: 54 mg/dL (ref >39)
LDL Cholesterol: 90 mg/dL (ref 0–99)
Total CHOL/HDL Ratio: 2.9 RATIO
Triglycerides: 54 mg/dL (ref <150)
VLDL: 11 mg/dL (ref 0–40)

## 2011-02-04 LAB — HEMOGLOBIN A1C
Hgb A1c MFr Bld: 4.8 % (ref 4.6–6.1)
Mean Plasma Glucose: 91 mg/dL

## 2011-02-04 LAB — APTT
aPTT: 30 seconds (ref 24–37)
aPTT: 37 seconds (ref 24–37)

## 2011-02-04 LAB — ETHANOL: Alcohol, Ethyl (B): 184 mg/dL — ABNORMAL HIGH (ref 0–10)

## 2011-02-04 LAB — PHENYTOIN LEVEL, TOTAL: Phenytoin Lvl: <2.5 ug/mL — ABNORMAL LOW (ref 10.0–20.0)

## 2011-02-04 LAB — TSH: TSH: 0.539 u[IU]/mL (ref 0.350–4.500)

## 2011-02-04 LAB — CORTISOL: Cortisol, Plasma: 14.5 ug/dL

## 2011-03-05 NOTE — Group Therapy Note (Signed)
Keith Roberson, Keith Roberson                 ACCOUNT NO.:  [id number]   MEDICAL RECORD NO.:  [id number]          PATIENT TYPE:  INP   LOCATION:  A312                          FACILITY:  [facility]   PHYSICIAN:  Dorris Singh, DO    DATE OF BIRTH:  05/05/1951   DATE OF PROCEDURE:  DATE OF DISCHARGE:                                 PROGRESS NOTE   The patient was seen today.  He was supposed to be discharged yesterday.  Prior to being discharged he had a large episode of hematuria.  Went  ahead and canceled the discharge for yesterday.  He has been stable.  We  ran H and H's on him, and he has had lesser episodes of hematuria.  Got  urology to see him.  Dr. Jerre Simon saw him today.  They will place a Foley  in him, and monitor his urine output.   VITALS:  For today 98.2, pulse 95, respirations 12, blood pressure  101/64.  GENERAL:  The patient is well-developed, well-nourished, in no acute  distress.  HEART:  Regular rate and rhythm.  LUNGS:  Clear to auscultation bilaterally.  ABDOMEN:  Soft, nontender, nondistended.  EXTREMITIES:  Positive pulses.   LABORATORY DATA:  Labs for today:  H and H 8.9 and 27.4.   ASSESSMENT/PLAN:  1. Hematuria.  Urology is on the case.  Will go ahead and monitor his      H and H.  Will get labs in the morning.  Will also start      intravenous fluids on him.  2. Seizure disorder.  The patient was cleared to be discharged      yesterday, but will continue to monitor this as well.  3. Ethanol abuse.  Will also have Ativan on hand for him and so we      resume all  old orders.      Dorris Singh, DO  Electronically Signed     CB/MEDQ  D:  10/28/2008  T:  10/28/2008  Job:  [id number]

## 2011-03-05 NOTE — H&P (Signed)
NAMEGEFFREY, Keith Roberson                 ACCOUNT NO.:  [id number]   MEDICAL RECORD NO.:  [id number]          PATIENT TYPE:  EMS   LOCATION:  ED                            FACILITY:  [facility]   PHYSICIAN:  Osvaldo Shipper, MD     DATE OF BIRTH:  1951-02-25   DATE OF ADMISSION:  10/21/2008  DATE OF DISCHARGE:  LH                              HISTORY & PHYSICAL   PRIMARY CARE PHYSICIAN:  The patient goes to the [facility].   ADMISSION DIAGNOSES:  1. Hypothermia and hypoglycemia, likely secondary to alcoholism.  2. Alcoholism.  3. History of seizure disorder, but has not been taking his      medications.   CHIEF COMPLAINT:  Unresponsiveness.   HISTORY OF PRESENT ILLNESS:  The patient is a 61 year old African  American male who, according to reports, was found unconscious and was  found to have a blood sugar of 13.  EMS administered D-50, Glucagon and  another ampule of D-50.  The blood sugar increased to 38.  EMS also  administered 2 mg of Narcan and 100 mg of thiamine.  According to other  reports, the patient apparently was okay earlier this morning when he  was with his nephew, but then the nephew returned and the patient was  found to be unconscious.  The patient was brought into the emergency  department at 11:52 a.m.   At this time the patient really is complaining of some mild right  shoulder pain.  He thought he had some hip pain earlier, but he does not  have any right now.  He thinks he has been urinating quite a bit, but  otherwise feels quite well.  The patient appears to be slightly confused  and slurred.  It is probably because of his alcohol intoxication.   MEDICATIONS AT HOME:  He is supposed to be on Dilantin but he tells me  he has not taken it in one month.  He has not had any seizures since  then.   ALLERGIES:  No known drug allergies.   PAST MEDICAL HISTORY:  1. Significant for alcoholism.  2. Alcohol withdrawal seizures.  3. History of hip  surgery in the past.   SOCIAL HISTORY:  He lives with his father and his nephew, unclear.  He  drinks quite a bit of liquor on a daily basis, but again is not  admitting at this time.  His last drink he mentions was yesterday.  He  smokes one pack of cigarettes on a daily basis.  He denies any illicit  drug use, but there is a record of cannabis use in the past.  It is  unclear if the patient is employed or not.   FAMILY HISTORY:  Positive for diabetes, coronary artery disease, history  of breast cancer.   REVIEW OF SYSTEMS:  At this time because of the patient's confusion, the  review cannot be obtained.   PHYSICAL EXAMINATION:  VITAL SIGNS:  Temperature initially when he came  in was 89.4 degrees rectally, last recorded as 98.1 degrees, blood  pressure  121/78, heart rate in the 90's, respiratory rate 16, saturation  100% on room air.  GENERAL:  A thin African American male, in no distress.  HEENT:  No pallor, no icterus.  Oral mucous membranes moist.  No oral  lesions are noted.  NECK:  Soft, supple.  No thyromegaly is appreciated.  LUNGS:  Clear to auscultation anteriorly bilaterally.  CARDIOVASCULAR:  S1 and S2 normal, regular.  No rubs, no murmurs.  A  carotid bruit on the right side is appreciated.  ABDOMEN:  Soft, nontender, nondistended.  Bowel sounds present.  No  masses or organomegaly are appreciated.  RECTAL:  Deferred.  GENITOURINARY:  Did not reveal any obvious abnormality.  A Foley  catheter is noted.  EXTREMITIES:  Do not show any edema.  MUSCULOSKELETAL:  His right hip had a full range of motion without any  tenderness.  Right shoulder had a full range of motion as well. The rest  of the joints appear to be quite normal.  NEUROLOGIC:  He is alert, disoriented.  No focal neurological deficits  at present.   LABORATORY DATA:  CBC showed a white count of 10.2, hemoglobin 13.7, MCV  113, platelet count 252.  Electrolytes are remarkably normal except for  a glucose  of 244.  Albumin is 3.4, liver function tests normal.  Cardiac  markers negative x2.  Alcohol level 184.  Urinalysis showed a specific  gravity of greater than 1.030, urine glucose 250, otherwise no evidence  for any infection.   He had a chest x-ray which showed a questionable nodule in the right  upper lobe, otherwise nothing acute.   ASSESSMENT:  This is a 60 year old African American male, who has a  known history of alcoholism, who presents after being found  unresponsive.  He was found to be hypoglycemic and hypothermic.  I think  that all of this is likely because of his alcoholism.  He could have a  thiamine deficiency.   PLAN:  1. Hypothermia and hypoglycemia:  His temperature is already up since      he has been on the warming blankets.  He will be observed on      telemetry, as hypothermia can cause arrhythmias, though he should      be out of that danger zone at this time.  Thiamine will be given.      He will be given IV fluids.  CBG's will be checked every hour, for      the next six hours.  A hemoglobin A1c will be checked.  2. Alcoholism:  He will be put on an Ativan protocol for alcohol      withdrawal.  I will check a Dilantin level on him as well.  3. History of alcohol withdrawal seizures:  He tells me that he is not      on Dilantin at this time.  I will check a level.  Since he has not      had any seizures in the last one month, I would withhold initiating      Dilantin at this time.  We will re-discuss the issue when the      patient is a little bit more lucid, as this history of seizures and      his Dilantin  use cannot be verified.  4. Macrocytosis:  Will check a TSH level.  We will check a B12 level      as well.  We will put him on folic acid and  multivitamins at this      time.  5. Right carotid bruit:  We will check carotid Dopplers to look for      significant stenosis.  6. Deep venous thrombosis prophylaxis:  Will be initiated.  Protonix      will  be given.   Further management decisions will depend upon the results of further  testing and the patient's response to treatment.      Osvaldo Shipper, MD  Electronically Signed     GK/MEDQ  D:  10/21/2008  T:  10/21/2008  Job:  [id number]   cc:   [facility]

## 2011-03-05 NOTE — Group Therapy Note (Signed)
NAMEBREK, REECE                 ACCOUNT NO.:  [id number]   MEDICAL RECORD NO.:  [id number]          PATIENT TYPE:  INP   LOCATION:  A312                          FACILITY:  [facility]   PHYSICIAN:  Dorris Singh, DO    DATE OF BIRTH:  1951/09/20   DATE OF PROCEDURE:  10/30/2008  DATE OF DISCHARGE:                                 PROGRESS NOTE   Patient was originally admitted for seizure-like activity.  During that  course of time he went into delirium tremens, ended up pulling out a  Foley.  Initially he started to have hematuria, it was thought it was  due to trauma; however, since trying to discharge the patient on January  8th the patient has continued to have bouts of hematuria.  Urology was  consulted.  We have been monitoring his urine to see if the hematuria  had stopped.  Last night patient had large clots and another episode of  hematuria and at this point in time a Foley was placed and his  hemoglobin dropped to below 8 and was determined he needed to be  transfused.  His other episodes did not give any changes in his  hemoglobin so it was thought that it was due to trauma.   VITALS ARE AS FOLLOWS:  Temperature 97.9, pulse 75, respirations 16,  blood pressure 122/85.  GENERAL:  The patient is well-developed, well-nourished, in no acute  distress.  HEART:  Regular rate and rhythm.  LUNGS:  Clear to auscultation bilaterally.  ABDOMEN:  Soft, nontender, nonextended.  GU:  Positive Foley placement.  EXTREMITIES:  Positive pulses.   LABORATORY DATA:  White count 7.5, hemoglobin at its lowest was 7.4, we  have given him a transfusion and it is 8.0, with a hematocrit of 26.6,  platelet count of 210.  BMP:  Sodium is 135, potassium 4.2, chloride  106, CO2 26, glucose 82, BUN 10 and creatinine 0.73.   ASSESSMENT/PLAN:  1. Gross hematuria.  2. Anemia secondary to blood loss.  3. Alcohol abuse.   PLAN:  Will continue to monitor patient's urine output, Dr. Clelia Croft is on  his  case as well.  Will transfuse 2 units of packed red blood cells,  will do serial H and H and will continue to monitor and change therapy  as necessary.      Dorris Singh, DO  Electronically Signed     CB/MEDQ  D:  10/30/2008  T:  10/30/2008  Job:  [id number]

## 2011-03-05 NOTE — Group Therapy Note (Signed)
Keith Roberson, Keith Roberson                 ACCOUNT NO.:  [id number]   MEDICAL RECORD NO.:  [id number]          PATIENT TYPE:  INP   LOCATION:  A312                          FACILITY:  [facility]   PHYSICIAN:  Osvaldo Shipper, MD     DATE OF BIRTH:  04-07-1951   DATE OF PROCEDURE:  10/25/2008  DATE OF DISCHARGE:                                 PROGRESS NOTE   SUBJECTIVE:  The patient denies any complaints.  He states that he is  doing fine.   OBJECTIVE:  Yesterday, the patient pulled his Foley out.  There was a  lot of trauma and bleeding from his penis, but that seems to be abating  at this time.  VITAL SIGNS:  Afebrile, heart rate 93, respirations 20, blood pressure  125/82, saturation 100% on room air.  LUNGS:  Clear to auscultation bilaterally.  No wheezes, rales or  rhonchi.  CARDIOVASCULAR:  S1, S2 is normal regular.  No murmurs appreciated.  No  S3, S4, rubs or bruits.  ABDOMEN:  Soft, nontender, nondistended.  Bowel sounds are present.  No  masses or organomegaly is appreciated.  EXTREMITIES:  No edema.  GU:  The penis appears to be normal.  There is no evidence of any  bleeding at this time through the urethra.   LABORATORY DATA:  Potassium 3.8, magnesium 1.6 two days ago.   ASSESSMENT/PLAN:  1. Urethra trauma secondary to pulling out Foley by the patient.  He      seems to be stable.  No active bleeding is noted.  We will follow      this closely.  CBC will be checked in the morning.  2. Delirium tremens appears to be improved.  He is not as agitated as      he was.  Continue with Ativan protocol and clonidine.  3. 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  He is on thiamine.      Counseling has been provided by myself and Child psychotherapist.  4. Hypoglycemia, likely a result of alcoholism.  He is on a D5 drip.      His blood sugars are still running sometimes low.  He will be      encouraged to eat his p.o. diet and we will follow his CBGs every      four hours for now.  5. He is on DVT  prophylaxis.  This will be continued.  He was      complaining of right hip pain yesterday.  X-ray does not show any      evidence for trauma.  6. He is on magnesium oxide.  He was given one dose of potassium      yesterday.  We will give him another dose of potassium today.  He      is on PPI.  7. The patient continues to be stable.  He is improving from his BP's.      We are awaiting his CBGs to stabilize      before he will be discharged.  He lives with his father and his      nephew,  and I think he can go back to live with them.  Social      Services may have to be involved to make sure the patient is safe      at home.  Our social worker is already on the case.      Osvaldo Shipper, MD  Electronically Signed     GK/MEDQ  D:  10/25/2008  T:  10/25/2008  Job:  [id number]

## 2011-03-05 NOTE — Group Therapy Note (Signed)
NAMERHEN, KAWECKI                 ACCOUNT NO.:  [id number]   MEDICAL RECORD NO.:  [id number]          PATIENT TYPE:  INP   LOCATION:  A312                          FACILITY:  [facility]   PHYSICIAN:  Dorris Singh, DO    DATE OF BIRTH:  1950-11-22   DATE OF PROCEDURE:  DATE OF DISCHARGE:                                 PROGRESS NOTE   The patient seen today.  States he has not had anymore episodes of  hematuria that he noticed.  However, there has been some confusion as to  whether how he is supposed to obtain his samples and according to the  nursing staff, he has flushed several urine sample down the toilet.  He  denies any dizziness or any other chest pain or shortness of breath.   PHYSICAL EXAMINATION:  VITAL SIGNS:  Temperature is 98.6, pulse 84,  respirations 16, blood pressure 114/76.  GENERAL:  The patient is alert and oriented.  He is well-developed and  well-nourished in no acute distress.  HEART:  Regular rate and rhythm.  LUNGS:  Clear to auscultation bilaterally.  ABDOMEN:  Soft, nontender, nondistended.  EXTREMITIES:  Positive pulses.  No edema, ecchymosis or cyanosis.   His hemoglobin and hematocrit for today is 9.1 and hematocrit is 27.7.  We are pending other labs on him for today and will get labs on him  tomorrow.   ASSESSMENT/PLAN:  1. Hematuria.  This apparently seems to be resolving.  Dr. Jerre Simon is      on his case.  I am hoping with continued IV hydration, we will be      able to discharge him tomorrow.  His hemoglobin seems to be      remaining stable.  2. Seizure-like activity.  The patient has not had any problems with      that since the potential being discharged on October 27, 2008, but      plan to discharge him within the next 24-48 hours.      Dorris Singh, DO  Electronically Signed     CB/MEDQ  D:  10/29/2008  T:  10/29/2008  Job:  [id number]

## 2011-03-08 NOTE — Discharge Summary (Signed)
NAMEMELO, STAUBER                           ACCOUNT NO.:  [id number]   MEDICAL RECORD NO.:  [id number]                   PATIENT TYPE:  INP   LOCATION:  A209                                 FACILITY:  [facility]   PHYSICIAN:  Gracelyn Nurse, M.D.              DATE OF BIRTH:  1951/05/13   DATE OF ADMISSION:  04/03/2003  DATE OF DISCHARGE:  04/06/2003                                 DISCHARGE SUMMARY   DISCHARGE DIAGNOSES:  1. Pneumonia.  2. Macrocytic anemia.  3. Hypokalemia.  4. Hypomagnesemia.  5. Alcohol abuse.  6. Seizure disorder.   DISCHARGE MEDICATIONS:  1. Dilantin 100 mg b.i.d.  2. Doxycycline 100 mg b.i.d. x2 weeks.  3. KCl 20 mEq daily x1 week.   REASON FOR ADMISSION:  This is a 60 year old black male with a history of  alcohol abuse and seizure disorder.  Three days ago he developed shortness  of breath and left-sided lateral chest wall pain.  The patient was visited  by his relatives who encouraged him to come to the hospital for evaluation.   HOSPITAL COURSE:  #1 - PNEUMONIA:  Chest x-ray did show an infiltrate.  The  patient was febrile and had a white count.  He was started on IV Zithromax  and Rocephin.  The patient's symptoms improved.  He still had some low-grade  fever at the time of discharge; however, he was clinically improved.  He was  stable for discharge.  He will be discharged on a two-week course of  doxycycline and will follow up with his primary care physician.   #2 - HYPOKALEMIA:  The patient's potassium was low at 2.6.  He was also  hypomagnesemic.  We repleted both.  He will receive potassium supplement for  one week and have this rechecked as an outpatient.   #3 - HYPOMAGNESEMIA:  As stated above, this was repleted.  He was given 1 g  of magnesium IV before discharge along with some potassium, and he will  follow up as an outpatient for this.   #4 - SEIZURE DISORDER:  He was subtherapeutic on his Dilantin so he was  reloaded and restarted  on oral medication.  At discharge his Dilantin level  was 12.5.   #5 - ALCOHOL ABUSE:  He showed no signs of alcohol withdrawal during  hospitalization so no treatment was done on this.   DISCHARGE LABORATORIES:  Dilantin was 12.5.  Sodium 130, potassium 2.6,  chloride 99, CO2 27, BUN 8, creatinine 0.9.   DISPOSITION:  The patient is discharged in stable condition.  He will follow  up with Dr. Raeanne Gathers in one week.  At that time he needs to have his potassium  rechecked.  Gracelyn Nurse, M.D.    JDJ/MEDQ  D:  04/06/2003  T:  04/06/2003  Job:  [id number]   cc:   Ileene Patrick, M.D.  [facility]

## 2011-03-08 NOTE — H&P (Signed)
NAMEGURDEEP, KEESEY                           ACCOUNT NO.:  [id number]   MEDICAL RECORD NO.:  [id number]                   PATIENT TYPE:  OBV   LOCATION:  A223                                 FACILITY:  [facility]   PHYSICIAN:  Vania Rea, M.D.              DATE OF BIRTH:  1951/07/15   DATE OF ADMISSION:  04/10/2004  DATE OF DISCHARGE:                                HISTORY & PHYSICAL   PRIMARY CARE PHYSICIAN:  [facility] in [place].   CHIEF COMPLAINT:  Confusion since this morning.   HISTORY OF PRESENT ILLNESS:  This is a 60 year old African-American male  with a history of seizure disorder and alcohol abuse who according to his  family drinks alcohol heavily. Recently came to live with his father;  however, his father himself was recently admitted to the [facility] for an unrelated condition. Brothers went to visit  Mr. Kia Dubreuil this morning and found him confused and mumbling gibberish,  drooling from his mouth, and had an episode of fainting. They called  emergency medical services. When emergency medical services arrived, the  patient was confused and unable to comprehend the paramedics, refused to  sign his name. They recorded a CBG of 25 but say patient adamantly refused  treatment. The patient was brought to the emergency room. In the emergency  room, the patient was found to be confused and weak, and Glucometer reading  in the emergency room was 102.   By brother's report, the patient was confused and drooling, but there was no  evidence of incontinence or biting of the tongue. The patient does wear  dentures, and he was edentulous at the time. There was no evidence of  trauma.   PAST MEDICAL HISTORY:  1. Seizure disorder.  2. Alcohol abuse.  3. History of hip surgery.   MEDICATIONS:  Dilantin 200 mg daily.   ALLERGIES:  No known drug allergies.   SOCIAL HISTORY:  Has worked as a Manufacturing systems engineer.  Smoked  one pack a day for the past 15 years. He admits to drinking one pint of gin  per day but has not drank anything for the past few days, unclear why. His  brothers say he drinks heavily. He denies any illicit drug use. Owns his own  home but recently came to live with his father for reasons unclear.   FAMILY HISTORY:  His father has coronary artery disease, is status post CABG  and is currently hospitalization because of the heart at the [facility]. His mother  died of CA of the breast. He has four brothers and three sisters, who apart  from two brothers who are markedly obese and have diabetes, the others are  healthy. He has a son and a daughter, both of them in their 70s and in good  health.   REVIEW OF SYSTEMS:  Unable to obtain a review of systems  because of the  patient's mental state. Brothers deny any known problems with this gentleman  apart from the alcohol, the seizure disorder, and sometimes staggering when  he walks.   PHYSICAL EXAMINATION:  GENERAL:  This is a thin, African-American gentleman  who looks older than his stated age, sitting up in the stretcher. He is  alert and oriented to place, but he is unable to give me the month or year.  He does not, however, appear to be in any acute distress.  VITAL SIGNS:  Temperature 97.9, blood pressure 109/74, pulse 79,  respirations 16. He is saturating 100% on room air.  HEENT:  His pupils are round, equal, and reactive to light. His mucous  membranes are moist.  NECK:  He has no jugular venous distention. No thyromegaly.  CHEST:  Clear to auscultation bilaterally.  CARDIOVASCULAR:  Rhythm is regular without murmurs.  ABDOMEN:  Soft. There is no tenderness. There is no hepatomegaly or  splenomegaly.  EXTREMITIES:  Without edema. He is thin and wasted. There is no evidence of  jaundice.  NEUROLOGICAL:  His cranial nerves are grossly intact, and his power is grade  5 throughout. His sensation is intact throughout, and his  reflexes are equal  and reactive throughout.   LABORATORY DATA:  His white count is 13.6, hemoglobin 12.1, hematocrit 35.1,  MCV 104, RDW 14.3, platelet count 245, neutrophils 85%. His alcohol level is  less than 5. His Dilantin level is 10.0 before having received 500 mg of  Dilantin in the emergency room. His serum chemistries:  Sodium 141,  potassium 4.0, chloride 107, CO2 27, glucose 108, BUN 9, creatinine 0.9,  calcium 9.0. His urinalysis showed a specific gravity of 1.025 and a trace  of protein, small amount of hemoglobin, otherwise unremarkable. He has 3 to  6 red blood cells on microscopy. CT scan of the head reveals no acute  abnormalities.   ASSESSMENT:  1. Acute confusion in a gentleman with a history of seizure disorder and     alcohol abuse whose alcohol level is less than 5. There is a possibility     this is an alcohol withdraw seizure. There is a possibility that this was     some sort of transient ischemic event.  2. Leukocytosis probably related to the seizure disorder.  3. Macrocytic anemia. Probably related to his alcohol abuse.   PLAN:  Plan for him will be to admit him overnight. If we can get a MRI,  that may be useful; however, he has no neurological deficits at this time.  We will observe him to see how quickly if he returns to his baseline. If he  returns to baseline and ___________, we will give nutritional supplements  and continue his Dilantin. Will also put him on an ETOH withdraw protocol.     ___________________________________________                                         Vania Rea, M.D.   LC/MEDQ  D:  04/10/2004  T:  04/10/2004  Job:  [id number]

## 2011-03-08 NOTE — Discharge Summary (Signed)
NAMEJESSEE, Keith Roberson                 ACCOUNT NO.:  [id number]   MEDICAL RECORD NO.:  [id number]          PATIENT TYPE:  INP   LOCATION:  A312                          FACILITY:  [facility]   PHYSICIAN:  Dorris Singh, DO    DATE OF BIRTH:  09/07/1951   DATE OF ADMISSION:  10/21/2008  DATE OF DISCHARGE:  01/11/2010LH                               DISCHARGE SUMMARY   ADMISSION DIAGNOSES:  1. Hypothermia and hypoglycemia.  2. Alcoholism.  3. History of seizure disorder.   DISCHARGE DIAGNOSES:  1. Hematuria.  2. Anemia.  3. Alcohol abuse.  4. Seizure-like disorder.   His H and P was done by Dr. Rito Ehrlich.  Please refer.   HISTORY OF PRESENT ILLNESS:  Patient was admitted for the above  diagnoses.  For the hypothermia and hypoglycemia, he was placed on  warming blanket.  His CBGs were checked every 6 hours.  For his  alcoholism, he was placed on Ativan protocol for his history of alcohol  withdrawal seizures.  Neurology was consulted.  For his macrocytosis,  his TSH level was also checked.   For his hematuria, patient prior to discharge on the 7th, right before  he left, had an episode of gross hematuria.  He had pulled out his Foley  several days later when he was confused, and we had been monitoring him  for that, and hematuria had resolved; however, right before discharge,  he ended up voiding a large amount of blood and clots.  At this point in  time, the discharge was cancelled.  A Foley was placed, and neurology  was consulted.  Urology followed him and monitored his urine status and  determined on the 11th that the patient then could be discharged with  the instructions to follow up if needed.   Also, while he was here, he was seen by the ACT team.  There was some  concern about patient's mental status.  They determined that patient  should go and be seen in a rehab facility for his alcohol abuse.  He was  counseled extensively by myself and Dr. Rito Ehrlich about alcohol  cessation  and the dangers of what he is doing.   DISCHARGE MEDICATIONS:  1. Dilantin 100 mg p.o. 3 tablets daily.  2. Thiamin 100 mg p.o. daily.  3. Folic acid 1 mg p.o. daily.  4. Multivitamin 1 p.o. daily.  5. Prilosec OTC 1 p.o. daily.  6. Catapres 0.1 mg p.o. b.i.d.   INSTRUCTIONS:  Follow up with his primary care physician, who is a  doctor at [facility], and Dr. Jerre Simon in 1 week.  He is  to stop alcohol, and it is recommended that he go to alcohol rehab after  discharge, and that he does not take any NSAIDs.  He is to take all over-  the-counter medications, thiamine, folic acid, and Prilosec as directed,  due to his chronic use of alcohol.  Patient's condition was stable.  Disposition was to home.      Dorris Singh, DO  Electronically Signed     CB/MEDQ  D:  11/16/2008  T:  11/16/2008  Job:  [id number]

## 2011-03-08 NOTE — Discharge Summary (Signed)
Keith Roberson, Keith Roberson                           ACCOUNT NO.:  [id number]   MEDICAL RECORD NO.:  [id number]                   PATIENT TYPE:  OBV   LOCATION:  A223                                 FACILITY:  [facility]   PHYSICIAN:  Vania Rea, M.D.              DATE OF BIRTH:  05/01/1951   DATE OF ADMISSION:  04/10/2004  DATE OF DISCHARGE:  04/11/2004                                 DISCHARGE SUMMARY   PRIMARY CARE PHYSICIAN:  [facility].   DISCHARGE DIAGNOSES:  1. Seizure disorder.  2. Noncompliance with medication.  3. Alcohol abuse.  4. Possible alcohol withdrawal.  5. Tobacco abuse.   DISPOSITION:  Discharge to home.   DISCHARGE CONDITION:  Stable.   DISCHARGE MEDICATIONS:  1. Dilantin 200 mg at bedtime.  2. Librium 25 mg twice daily for two days then once daily for two days then     discontinue.  3. Folic acid 1 mg daily.  4. Diovan 100 mg daily.  5. Multivitamins one tablet daily.   HOSPITAL COURSE:  Please refer to admission history and physical.  Patient  was admitted in a postictal state and confused.  Dilantin level was 10.0 on  admission.  Patient was saying that he takes his Dilantin one tablet per  week, however his history was somewhat unreliable.  Patient also had a  history of alcohol abuse and his alcohol level was unrecordable.  It was  unclear whether his seizures were due to ETOH withdrawal or noncompliance  with medication.  Patient was admitted for observation because of his  prolonged confused state.  This morning patient is alert and oriented and  admits that he takes his Dilantin only one tablet daily, when in fact he  should be taking it one tablet twice daily.  Patient's physical exam is  unremarkable at this time.  Patient has been counseled on the importance of  taking 200 mg of his Dilantin daily.  His regimen is being simplified for  200 mg at bedtime for the ease of compliance and patient has also be  counseled on the dangers of  tobacco and advised to quit.  Patient has also  been counseled on the dangers of alcohol and has been advised to quit.  He  is being discharged with enough medication to complete his Librium  withdrawal protocol.   FOLLOW UP:  Followup with his primary care physicians at [facility].    ___________________________________________                                         Vania Rea, M.D.   LC/MEDQ  D:  04/11/2004  T:  04/11/2004  Job:  [id number]

## 2011-03-08 NOTE — H&P (Signed)
NAMEMANDELA, BELLO NO.:  [id number]   MEDICAL RECORD NO.:  [id number]                  PATIENT TYPE:   LOCATION:  209                                  FACILITY:   PHYSICIAN:  Sarita Bottom, M.D.                  DATE OF BIRTH:   DATE OF ADMISSION:  DATE OF DISCHARGE:                                HISTORY & PHYSICAL   PRIMARY CARE PHYSICIAN:  [facility].   CHIEF COMPLAINT:  I have pain on my right side.   HISTORY OF PRESENT ILLNESS:  Mr. Keith Roberson is a 60 year old man with a history  of alcohol abuse and seizure disorder.  He said he was well until about  three days ago when he noted the insidious onset of shortness of breath and  left-sided lateral chest wall pain.  The patient was visited by his  relatives who encouraged him to come to the hospital for evaluation.  The  patient's sisters also claimed that the neighbors told them that the patient  had vomited blood a few days ago.  The patient, however, denies this and  says that he had drunk some V-8 tomato juice and then he spat some, but he  never really vomited any blood.   REVIEW OF SYSTEMS:  GENERAL:  He denies any recent weight loss, he denies  any fevers or chills.  RESPIRATORY:  He admits to cough productive of  whitish-yellowish phlegm.  GASTROINTESTINAL:  He denies any nausea,  vomiting, or diarrhea.  CNS:  He admits to headaches on and off.  He denies  any dizziness at the moment.  PSYCHIATRIC:  He denies any depression.   PAST MEDICAL HISTORY:  1. History of alcohol abuse.  2. History of seizure disorder.  He does not recall his last seizure     episode.   MEDICATIONS:  Dilantin 200 mg daily.   ALLERGIES:  No known drug allergies.   FAMILY HISTORY:  Significant for seizure disorder in his uncle.  His mother  died from cancer of the breast.   SOCIAL HISTORY:  He is divorced, he lives alone, he has four children with  different women, he  drinks alcohol a lot.  Currently, he has cut down to  weekend binges.  He smokes cigarettes, and is currently on social security  disability.   PHYSICAL EXAMINATION:  VITAL SIGNS:  Temperature is 100.9, blood pressure is  95/65, heart rate is 110.  GENERAL:  He is a middle-aged man laying comfortably in bed.  He is tall and  thin in stature.  HEENT:  He had bitemporal wasting.  Anicteric.  He is edentulous.  Oral  mucosa is moist.  NECK:  Supple, no lymphadenopathy, no carotid bruit, no thyromegaly.  CHEST:  Air entry is good bilaterally.  Breath sounds are symmetrical.  He  has a few rales on the left lower  lung posterior zone.  CARDIOVASCULAR:  Heart sounds one and two are normal.  Heart rate is  tachycardic.  No murmurs were heard.  ABDOMEN:  Benign.  NEUROLOGIC:  He is awake, he is well oriented to name, place, and person.  EXTREMITIES:  He has no pedal edema.  Pulses are 2+ bilaterally.  RECTAL:  He has an empty rectal vault.  Stool guaiac is negative.   LABORATORY DATA:  Chest x-ray AP shows a left lower lobe infiltrate.  His  white blood cell count is 2.6, neutrophils 22%, hemoglobin 10.4, hematocrit  29.4, MCV 106.  Total protein 5.2, albumin 2.1, SGOT 75, SGPT 35, alkaline  phosphatase 73.  His Dilantin level is subtherapeutic at 2.5.  CK 10, MB  fraction is 0.3, troponin-I is 0.02.  WBC shows a toxic granulation.  Sodium  is 130, potassium 3.1, chloride 93, CO2 27, BUN 24, creatinine 1.3, glucose  109, calcium 7.5.   ASSESSMENT AND PLAN:  1. Left lobar pneumonia.  The patient will be admitted to the ward.  Blood     cultures and sputum cultures will be ordered.  The patient will be     treated with IV Rocephin and Zithromax.  Tylenol 650 mg q.6h. will be     provided for fever and pain.  2. Questionable gastrointestinal bleed.  Order serial hemoglobins and     hematocrits.  If the patient's hemoglobin and hematocrit decrease, a GI     consult might be called for further  evaluation and assessment.  3. Alcohol abuse.  The patient will be given Ativan 1 mg q.8h. for delirium     tremens prophylaxis.  He will also be given thymine 1 mg daily.  4. Seizure disorder.  The patient's Dilantin level is currently     subtherapeutic.  He will be reloaded with Dilantin, and his Dilantin     levels will be monitored.  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  I     will order these levels.  The patient will be supplemented with folic     acid 1 mg daily, and vitamin B12 250 mcg p.o. daily.  6. Hypokalemia.  This will be replenished with IV KCL 10 mEq x4 doses with     40 mEq of K-Dur.  7. Hyponatremia.  Probably secondary to volume depletion.  The patient will     be given normal saline at 100 cc per hour, and his BMET will be     monitored.   The patient is to be admitted under the hospitalist service.  The above plan  has been discussed with the patient and the patient's relative, and they  seem agreeable to the plan.   Coordination of the case was about 65 minutes.                                                 Sarita Bottom, M.D.    DW/MEDQ  D:  04/03/2003  T:  04/03/2003  Job:  [id number]

## 2011-07-11 LAB — CBC
HCT: 36.7 — ABNORMAL LOW
Hemoglobin: 12 — ABNORMAL LOW
MCHC: 32.6
MCV: 111.4 — ABNORMAL HIGH
Platelets: 219
RBC: 3.3 — ABNORMAL LOW
RDW: 17.3 — ABNORMAL HIGH
WBC: 7.9

## 2011-07-11 LAB — DIFFERENTIAL
Basophils Absolute: 0
Basophils Relative: 0
Eosinophils Absolute: 0
Eosinophils Relative: 0
Lymphocytes Relative: 16
Lymphs Abs: 1.3
Monocytes Absolute: 0.2
Monocytes Relative: 2 — ABNORMAL LOW
Neutro Abs: 6.4
Neutrophils Relative %: 82 — ABNORMAL HIGH

## 2011-07-11 LAB — BASIC METABOLIC PANEL
BUN: 8
CO2: 20
Calcium: 8.3 — ABNORMAL LOW
Chloride: 96
Creatinine, Ser: 0.94
GFR calc Af Amer: >60
GFR calc non Af Amer: >60
Glucose, Bld: 163 — ABNORMAL HIGH
Potassium: 4.1
Sodium: 132 — ABNORMAL LOW

## 2011-07-11 LAB — PHENYTOIN LEVEL, TOTAL: Phenytoin Lvl: <2.5 — ABNORMAL LOW

## 2011-07-11 LAB — ETHANOL: Alcohol, Ethyl (B): 77 — ABNORMAL HIGH

## 2011-07-22 LAB — ETHANOL: Alcohol, Ethyl (B): 109 — ABNORMAL HIGH

## 2011-07-22 LAB — URINALYSIS, ROUTINE W REFLEX MICROSCOPIC
Glucose, UA: 250 — AB
Hgb urine dipstick: NEGATIVE
Ketones, ur: 15 — AB
Nitrite: NEGATIVE
Protein, ur: NEGATIVE
Specific Gravity, Urine: >1.03 — ABNORMAL HIGH
Urobilinogen, UA: 2 — ABNORMAL HIGH
pH: 5

## 2011-07-22 LAB — HEPATIC FUNCTION PANEL
ALT: 65 — ABNORMAL HIGH
AST: 269 — ABNORMAL HIGH
Albumin: 3.4 — ABNORMAL LOW
Alkaline Phosphatase: 145 — ABNORMAL HIGH
Bilirubin, Direct: 0.6 — ABNORMAL HIGH
Indirect Bilirubin: 1 — ABNORMAL HIGH
Total Bilirubin: 1.6 — ABNORMAL HIGH
Total Protein: 6.2

## 2011-07-22 LAB — BASIC METABOLIC PANEL
BUN: 9
CO2: 21
Calcium: 9.2
Chloride: 103
Creatinine, Ser: 1.07
GFR calc Af Amer: >60
GFR calc non Af Amer: >60
Glucose, Bld: 134 — ABNORMAL HIGH
Potassium: 5.4 — ABNORMAL HIGH
Sodium: 139

## 2011-07-22 LAB — CBC
HCT: 35.2 — ABNORMAL LOW
Hemoglobin: 12 — ABNORMAL LOW
MCHC: 34
MCV: 123.8 — ABNORMAL HIGH
Platelets: 91 — ABNORMAL LOW
RBC: 2.84 — ABNORMAL LOW
RDW: 20.1 — ABNORMAL HIGH
WBC: 5.5

## 2011-07-22 LAB — DIFFERENTIAL
Basophils Absolute: 0
Basophils Relative: 0
Eosinophils Absolute: 0
Eosinophils Relative: 0
Lymphocytes Relative: 21
Lymphs Abs: 1.2
Monocytes Absolute: 0.2
Monocytes Relative: 3
Neutro Abs: 4.1
Neutrophils Relative %: 76

## 2011-07-22 LAB — PHENYTOIN LEVEL, TOTAL: Phenytoin Lvl: <2.5 — ABNORMAL LOW

## 2011-07-22 LAB — AMMONIA: Ammonia: 26

## 2014-01-08 ENCOUNTER — Ambulatory Visit: Payer: Self-pay | Admitting: Neurology

## 2016-02-01 ENCOUNTER — Encounter (INDEPENDENT_AMBULATORY_CARE_PROVIDER_SITE_OTHER): Payer: Self-pay | Admitting: *Deleted

## 2016-07-16 ENCOUNTER — Other Ambulatory Visit: Payer: Self-pay | Admitting: Internal Medicine

## 2016-07-16 ENCOUNTER — Other Ambulatory Visit (HOSPITAL_COMMUNITY): Payer: Self-pay | Admitting: Internal Medicine

## 2016-07-16 DIAGNOSIS — I714 Abdominal aortic aneurysm, without rupture, unspecified: Secondary | ICD-10-CM

## 2016-07-29 ENCOUNTER — Ambulatory Visit (HOSPITAL_COMMUNITY): Admission: RE | Admit: 2016-07-29 | Payer: Medicare HMO | Source: Ambulatory Visit

## 2016-08-29 ENCOUNTER — Encounter (INDEPENDENT_AMBULATORY_CARE_PROVIDER_SITE_OTHER): Payer: Self-pay | Admitting: *Deleted

## 2016-09-05 ENCOUNTER — Other Ambulatory Visit (HOSPITAL_COMMUNITY): Payer: Self-pay | Admitting: Internal Medicine

## 2016-09-05 DIAGNOSIS — Z136 Encounter for screening for cardiovascular disorders: Secondary | ICD-10-CM

## 2016-09-10 ENCOUNTER — Ambulatory Visit (HOSPITAL_COMMUNITY)
Admission: RE | Admit: 2016-09-10 | Discharge: 2016-09-10 | Disposition: A | Discharge status: Home / Self Care | Payer: Medicare HMO | Source: Ambulatory Visit | Attending: Internal Medicine | Admitting: Internal Medicine

## 2016-09-10 DIAGNOSIS — Z136 Encounter for screening for cardiovascular disorders: Secondary | ICD-10-CM | POA: Diagnosis not present

## 2016-10-22 ENCOUNTER — Encounter (INDEPENDENT_AMBULATORY_CARE_PROVIDER_SITE_OTHER): Payer: Self-pay | Admitting: *Deleted

## 2016-10-23 ENCOUNTER — Other Ambulatory Visit (INDEPENDENT_AMBULATORY_CARE_PROVIDER_SITE_OTHER): Payer: Self-pay | Admitting: *Deleted

## 2016-10-23 DIAGNOSIS — Z1211 Encounter for screening for malignant neoplasm of colon: Secondary | ICD-10-CM

## 2016-12-20 ENCOUNTER — Encounter (INDEPENDENT_AMBULATORY_CARE_PROVIDER_SITE_OTHER): Payer: Self-pay | Admitting: *Deleted

## 2016-12-20 ENCOUNTER — Telehealth (INDEPENDENT_AMBULATORY_CARE_PROVIDER_SITE_OTHER): Payer: Self-pay | Admitting: *Deleted

## 2016-12-20 NOTE — Telephone Encounter (Signed)
Patient needs trilyte 

## 2016-12-23 MED ORDER — PEG 3350-KCL-NA BICARB-NACL 420 G PO SOLR: 4000.0000 mL | Freq: Once | ORAL | 0 refills | Status: AC

## 2016-12-24 ENCOUNTER — Telehealth (INDEPENDENT_AMBULATORY_CARE_PROVIDER_SITE_OTHER): Payer: Self-pay | Admitting: *Deleted

## 2016-12-24 NOTE — Telephone Encounter (Signed)
Referring MD/PCP: denise hunter -- [facility]   Procedure: tcs  Reason/Indication:  screening  Has patient had this procedure before?  no  If so, when, by whom and where?    Is there a family history of colon cancer?  no  Who?  What age when diagnosed?    Is patient diabetic?   no      Does patient have prosthetic heart valve or mechanical valve?  no  Do you have a pacemaker?  no  Has patient ever had endocarditis? no  Has patient had joint replacement within last 12 months?  no  Does patient tend to be constipated or take laxatives? no  Does patient have a history of alcohol/drug use?  no  Is patient on Coumadin, Plavix and/or Aspirin? no  Medications: vit d 50,000 once a week, vit b12 daily  Allergies: nkda  Medication Adjustment per Dr Laural Golden:   Procedure date & time: 01/22/17 at 830

## 2016-12-25 NOTE — Telephone Encounter (Signed)
agree

## 2017-01-22 ENCOUNTER — Encounter (HOSPITAL_COMMUNITY): Admission: RE | Payer: Self-pay | Source: Ambulatory Visit

## 2017-01-22 ENCOUNTER — Ambulatory Visit (HOSPITAL_COMMUNITY): Admission: RE | Admit: 2017-01-22 | Payer: Medicare HMO | Source: Ambulatory Visit | Admitting: Internal Medicine

## 2017-01-22 SURGERY — COLONOSCOPY
Anesthesia: Moderate Sedation

## 2017-03-08 ENCOUNTER — Emergency Department (HOSPITAL_COMMUNITY)
Admission: EM | Admit: 2017-03-08 | Discharge: 2017-03-08 | Disposition: A | Discharge status: Home / Self Care | Payer: Medicare HMO | Attending: Emergency Medicine | Admitting: Emergency Medicine

## 2017-03-08 ENCOUNTER — Emergency Department (HOSPITAL_COMMUNITY): Payer: Medicare HMO

## 2017-03-08 ENCOUNTER — Encounter (HOSPITAL_COMMUNITY): Payer: Self-pay | Admitting: Emergency Medicine

## 2017-03-08 DIAGNOSIS — R112 Nausea with vomiting, unspecified: Secondary | ICD-10-CM | POA: Diagnosis present

## 2017-03-08 DIAGNOSIS — F17210 Nicotine dependence, cigarettes, uncomplicated: Secondary | ICD-10-CM | POA: Insufficient documentation

## 2017-03-08 DIAGNOSIS — E860 Dehydration: Secondary | ICD-10-CM | POA: Diagnosis not present

## 2017-03-08 HISTORY — DX: Unspecified convulsions: R56.9

## 2017-03-08 HISTORY — DX: Unspecified dementia, unspecified severity, without behavioral disturbance, psychotic disturbance, mood disturbance, and anxiety: F03.90

## 2017-03-08 LAB — COMPREHENSIVE METABOLIC PANEL
ALT: 9 U/L — ABNORMAL LOW (ref 17–63)
AST: 26 U/L (ref 15–41)
Albumin: 4.1 g/dL (ref 3.5–5.0)
Alkaline Phosphatase: 170 U/L — ABNORMAL HIGH (ref 38–126)
Anion gap: 15 (ref 5–15)
BUN: 15 mg/dL (ref 6–20)
CO2: 24 mmol/L (ref 22–32)
Calcium: 9 mg/dL (ref 8.9–10.3)
Chloride: 102 mmol/L (ref 101–111)
Creatinine, Ser: 1.2 mg/dL (ref 0.61–1.24)
GFR calc Af Amer: >60 mL/min (ref >60)
GFR calc non Af Amer: >60 mL/min (ref >60)
Glucose, Bld: 92 mg/dL (ref 65–99)
Potassium: 4.2 mmol/L (ref 3.5–5.1)
Sodium: 141 mmol/L (ref 135–145)
Total Bilirubin: 1.4 mg/dL — ABNORMAL HIGH (ref 0.3–1.2)
Total Protein: 7.3 g/dL (ref 6.5–8.1)

## 2017-03-08 LAB — URINALYSIS, ROUTINE W REFLEX MICROSCOPIC
Bilirubin Urine: NEGATIVE
Glucose, UA: NEGATIVE mg/dL
Ketones, ur: 20 mg/dL — AB
Leukocytes, UA: NEGATIVE
Nitrite: NEGATIVE
Protein, ur: 30 mg/dL — AB
Specific Gravity, Urine: 1.024 (ref 1.005–1.030)
pH: 6 (ref 5.0–8.0)

## 2017-03-08 LAB — CBC WITH DIFFERENTIAL/PLATELET
Basophils Absolute: 0 10*3/uL (ref 0.0–0.1)
Basophils Relative: 0 %
Eosinophils Absolute: 0 10*3/uL (ref 0.0–0.7)
Eosinophils Relative: 0 %
HCT: 40.8 % (ref 39.0–52.0)
Hemoglobin: 14 g/dL (ref 13.0–17.0)
Lymphocytes Relative: 15 %
Lymphs Abs: 2.3 10*3/uL (ref 0.7–4.0)
MCH: 32.6 pg (ref 26.0–34.0)
MCHC: 34.3 g/dL (ref 30.0–36.0)
MCV: 95.1 fL (ref 78.0–100.0)
Monocytes Absolute: 0.9 10*3/uL (ref 0.1–1.0)
Monocytes Relative: 6 %
Neutro Abs: 12 10*3/uL — ABNORMAL HIGH (ref 1.7–7.7)
Neutrophils Relative %: 79 %
Platelets: 209 10*3/uL (ref 150–400)
RBC: 4.29 MIL/uL (ref 4.22–5.81)
RDW: 13.7 % (ref 11.5–15.5)
WBC: 15.2 10*3/uL — ABNORMAL HIGH (ref 4.0–10.5)

## 2017-03-08 LAB — LIPASE, BLOOD: Lipase: 13 U/L (ref 11–51)

## 2017-03-08 LAB — ETHANOL: Alcohol, Ethyl (B): <5 mg/dL (ref <5)

## 2017-03-08 MED ORDER — ONDANSETRON HCL 4 MG/2ML IJ SOLN
4.0000 mg | Freq: Once | INTRAMUSCULAR | Status: AC
  Administered 2017-03-08: 4 mg via INTRAVENOUS
  Filled 2017-03-08: qty 2

## 2017-03-08 MED ORDER — SODIUM CHLORIDE 0.9 % IV BOLUS (SEPSIS)
500.0000 mL | Freq: Once | INTRAVENOUS | Status: AC
  Administered 2017-03-08: 500 mL via INTRAVENOUS

## 2017-03-08 MED ORDER — ONDANSETRON 4 MG PO TBDP: 4.0000 mg | ORAL_TABLET | Freq: Three times a day (TID) | ORAL | 0 refills | Status: DC | PRN

## 2017-03-08 NOTE — ED Notes (Signed)
Pt informed of need for urine sample. Pt states he does not have to go at this time but was provided with a urinal and informed to let nursing staff know when able to provide one.

## 2017-03-08 NOTE — ED Notes (Signed)
Pt reminded of need for urine sample but states he still does not have to go.

## 2017-03-08 NOTE — ED Provider Notes (Signed)
Blood pressure (!) 161/87, pulse (!) 103, temperature 99.3 F (37.4 C), temperature source Rectal, resp. rate 20, height '5\' 11"'$  (1.803 m), weight 162 lb (73.5 kg), SpO2 100 %.  Assuming care from Dr. Alvino Chapel.  In short, Keith Roberson is a 66 y.o. male with a chief complaint of Weakness and Emesis .  Refer to the original H&P for additional details.  The current plan of care is to follow labs and imaging and reassess.  10:32 PM Patient is awake and alert. He has a leukocytosis but no other clear evidence of infection. Alkaline phosphatase and T bili are slightly elevated the patient has no abdominal tenderness on my exam to raise my suspicion for acute cholecystitis or other acute intra-abdominal process. Urinalysis pending.   Normal UA. Patient is tolerating PO at discharge after Zofran and IVF. Suspect viral gastritis symptoms. Plan for PCP follow up in the coming days.   At this time, I do not feel there is any life-threatening condition present. I have reviewed and discussed all results (EKG, imaging, lab, urine as appropriate), exam findings with patient. I have reviewed nursing notes and appropriate previous records.  I feel the patient is safe to be discharged home without further emergent workup. Discussed usual and customary return precautions. Patient and family (if present) verbalize understanding and are comfortable with this plan.  Patient will follow-up with their primary care provider. If they do not have a primary care provider, information for follow-up has been provided to them. All questions have been answered.  Nanda Quinton, MD   Margette Fast, MD 03/08/17 [phone]

## 2017-03-08 NOTE — ED Triage Notes (Signed)
Per EMS family called stating the patient has been drinking ETOH for the last week and has been having increased weakness and vomiting today.

## 2017-03-08 NOTE — ED Notes (Signed)
Pt asked for water, and given water per Dr. Laverta Baltimore

## 2017-03-08 NOTE — Discharge Instructions (Signed)

## 2017-03-08 NOTE — ED Provider Notes (Signed)
[facility] DEPT Provider Note   CSN: [id number] Arrival date & time: 03/08/17  2022     History   Chief Complaint Chief Complaint  Patient presents with  . Weakness  . Emesis    HPI Keith Roberson is a 66 y.o. male.  HPI Patient presents with weakness nausea and vomiting. Began today. Reportedly sent alcoholic. States he has not drank today but drank yesterday. States she's had a little bit of a cough with a little bit of "cold". No dysuria. No dominant pain. Denies sore throat. Denies sick contacts. States he has not had a good appetite.   Past Medical History:  Diagnosis Date  . Dementia   . Seizures [facility])     Patient Active Problem List   Diagnosis Date Noted  . Special screening for malignant neoplasms, colon 10/23/2016    History reviewed. No pertinent surgical history.     Home Medications    Prior to Admission medications   Not on File    Family History History reviewed. No pertinent family history.  Social History Social History  Substance Use Topics  . Smoking status: Current Every Day Smoker    Packs/day: 0.50    Types: Cigarettes  . Smokeless tobacco: Not on file  . Alcohol use Yes     Allergies   Patient has no known allergies.   Review of Systems Review of Systems  Constitutional: Positive for appetite change.  HENT: Negative for congestion.   Respiratory: Negative for shortness of breath.   Cardiovascular: Negative for leg swelling.  Gastrointestinal: Positive for nausea and vomiting. Negative for abdominal pain.  Genitourinary: Negative for flank pain.  Musculoskeletal: Negative for back pain.  Neurological: Positive for weakness.  Psychiatric/Behavioral: Negative for confusion.     Physical Exam Updated Vital Signs BP (!) 161/87 (BP Location: Left Arm)   Pulse (!) 103   Temp 100 F (37.8 C) (Oral)   Resp 20   Ht '5\' 11"'$  (1.803 m)   Wt 162 lb (73.5 kg)   SpO2 100%   BMI 22.59 kg/m   Physical Exam    Constitutional: He appears well-developed.  HENT:  Head: Atraumatic.  Tongue has white on the top aspect of it.   Eyes: Pupils are equal, round, and reactive to light.  Neck: Neck supple.  Cardiovascular: Normal rate.   Pulmonary/Chest: Effort normal.  Abdominal: Soft. There is no tenderness.  Musculoskeletal: He exhibits no edema.  Neurological: He is alert.  Skin: Skin is warm. Capillary refill takes less than 2 seconds.  Psychiatric:  Somewhat flat aspect.     ED Treatments / Results  Labs (all labs ordered are listed, but only abnormal results are displayed) Labs Reviewed  COMPREHENSIVE METABOLIC PANEL  ETHANOL  LIPASE, BLOOD  CBC WITH DIFFERENTIAL/PLATELET  URINALYSIS, ROUTINE W REFLEX MICROSCOPIC    EKG  EKG Interpretation None       Radiology No results found.  Procedures Procedures (including critical care time)  Medications Ordered in ED Medications  sodium chloride 0.9 % bolus 500 mL (not administered)  ondansetron (ZOFRAN) injection 4 mg (not administered)     Initial Impression / Assessment and Plan / ED Course  I have reviewed the triage vital signs and the nursing notes.  Pertinent labs & imaging results that were available during my care of the patient were reviewed by me and considered in my medical decision making (see chart for details).     Patient with nausea vomiting. Low-grade temperature here at 100.  Labs pending. History of alcoholism. Reportedly last drink yesterday. Care turned over to Dr Laverta Baltimore.  Final Clinical Impressions(s) / ED Diagnoses   Final diagnoses:  Nausea and vomiting, intractability of vomiting not specified, unspecified vomiting type    New Prescriptions New Prescriptions   No medications on file     Davonna Belling, MD 03/08/17 2041

## 2018-04-28 ENCOUNTER — Other Ambulatory Visit: Payer: Self-pay | Admitting: Internal Medicine

## 2018-04-28 DIAGNOSIS — R221 Localized swelling, mass and lump, neck: Secondary | ICD-10-CM

## 2018-05-15 ENCOUNTER — Emergency Department
Admission: EM | Admit: 2018-05-15 | Discharge: 2018-05-15 | Disposition: A | Discharge status: Home / Self Care | Payer: Medicare HMO | Attending: Emergency Medicine | Admitting: Emergency Medicine

## 2018-05-15 ENCOUNTER — Encounter: Payer: Self-pay | Admitting: Emergency Medicine

## 2018-05-15 ENCOUNTER — Other Ambulatory Visit: Payer: Self-pay

## 2018-05-15 DIAGNOSIS — F10929 Alcohol use, unspecified with intoxication, unspecified: Secondary | ICD-10-CM | POA: Insufficient documentation

## 2018-05-15 DIAGNOSIS — F0390 Unspecified dementia without behavioral disturbance: Secondary | ICD-10-CM | POA: Diagnosis not present

## 2018-05-15 DIAGNOSIS — Z7289 Other problems related to lifestyle: Secondary | ICD-10-CM

## 2018-05-15 DIAGNOSIS — F17210 Nicotine dependence, cigarettes, uncomplicated: Secondary | ICD-10-CM | POA: Diagnosis not present

## 2018-05-15 DIAGNOSIS — Z789 Other specified health status: Secondary | ICD-10-CM

## 2018-05-15 LAB — URINE DRUG SCREEN, QUALITATIVE (ARMC ONLY)
Amphetamines, Ur Screen: NOT DETECTED
Barbiturates, Ur Screen: NOT DETECTED
Benzodiazepine, Ur Scrn: NOT DETECTED
Cannabinoid 50 Ng, Ur ~~LOC~~: NOT DETECTED
Cocaine Metabolite,Ur ~~LOC~~: NOT DETECTED
MDMA (Ecstasy)Ur Screen: NOT DETECTED
Methadone Scn, Ur: NOT DETECTED
Opiate, Ur Screen: NOT DETECTED
Phencyclidine (PCP) Ur S: NOT DETECTED
Tricyclic, Ur Screen: NOT DETECTED

## 2018-05-15 LAB — COMPREHENSIVE METABOLIC PANEL
ALT: 6 U/L (ref 0–44)
AST: 13 U/L — ABNORMAL LOW (ref 15–41)
Albumin: 3.5 g/dL (ref 3.5–5.0)
Alkaline Phosphatase: 77 U/L (ref 38–126)
Anion gap: 6 (ref 5–15)
BUN: 9 mg/dL (ref 8–23)
CO2: 28 mmol/L (ref 22–32)
Calcium: 8.6 mg/dL — ABNORMAL LOW (ref 8.9–10.3)
Chloride: 106 mmol/L (ref 98–111)
Creatinine, Ser: 0.91 mg/dL (ref 0.61–1.24)
GFR calc Af Amer: >60 mL/min (ref >60)
GFR calc non Af Amer: >60 mL/min (ref >60)
Glucose, Bld: 78 mg/dL (ref 70–99)
Potassium: 4.1 mmol/L (ref 3.5–5.1)
Sodium: 140 mmol/L (ref 135–145)
Total Bilirubin: 0.5 mg/dL (ref 0.3–1.2)
Total Protein: 6.9 g/dL (ref 6.5–8.1)

## 2018-05-15 LAB — CBC
HCT: 31.4 % — ABNORMAL LOW (ref 40.0–52.0)
Hemoglobin: 10.8 g/dL — ABNORMAL LOW (ref 13.0–18.0)
MCH: 36.5 pg — ABNORMAL HIGH (ref 26.0–34.0)
MCHC: 34.5 g/dL (ref 32.0–36.0)
MCV: 105.8 fL — ABNORMAL HIGH (ref 80.0–100.0)
Platelets: 232 10*3/uL (ref 150–440)
RBC: 2.96 MIL/uL — ABNORMAL LOW (ref 4.40–5.90)
RDW: 18.1 % — ABNORMAL HIGH (ref 11.5–14.5)
WBC: 5.3 10*3/uL (ref 3.8–10.6)

## 2018-05-15 LAB — ETHANOL: Alcohol, Ethyl (B): 140 mg/dL — ABNORMAL HIGH (ref <10)

## 2018-05-15 NOTE — ED Notes (Signed)
Patient transferred to room 19H, he is calm and cooperative, no behaviors noted, q 15 minute checks for safety.
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
## 2018-05-15 NOTE — ED Notes (Signed)
IVC 

## 2018-05-15 NOTE — ED Notes (Signed)
Dr. Weber Cooks talking to Patient and Patient remains calm.

## 2018-05-15 NOTE — ED Notes (Signed)
Patient with discharge orders, he voices understanding, no signs of distress, all belongings given back to Patient, nurse called [facility] to come and take him back home and awaiting response.

## 2018-05-15 NOTE — ED Provider Notes (Signed)
[facility] Emergency Department Provider Note   ____________________________________________    I have reviewed the triage vital signs and the nursing notes.   HISTORY  Chief Complaint IVC     HPI Keith MEKHI Roberson is a 67 y.o. male with a history of alcohol abuse who was brought in under IVC by police, apparently family filled out IVC form because they are concerned about his drinking.  Apparently there was also a.  Dead squirrel in his freezer.  Patient thinks that his family is trying to take away his house he denies SI or HI.  Does admit to drinking but does not think it is a problem for him.  Past Medical History:  Diagnosis Date  . Dementia   . Seizures [facility])     Patient Active Problem List   Diagnosis Date Noted  . Special screening for malignant neoplasms, colon 10/23/2016    History reviewed. No pertinent surgical history.  Prior to Admission medications   Medication Sig Start Date End Date Taking? Authorizing Provider  ondansetron (ZOFRAN ODT) 4 MG disintegrating tablet Take 1 tablet (4 mg total) by mouth every 8 (eight) hours as needed for nausea or vomiting. 03/08/17   Long, Wonda Olds, MD     Allergies Patient has no known allergies.  No family history on file.  Social History Social History   Tobacco Use  . Smoking status: Current Every Day Smoker    Packs/day: 0.50    Types: Cigarettes  . Smokeless tobacco: Never Used  Substance Use Topics  . Alcohol use: Yes  . Drug use: No    Review of Systems  Constitutional: No fever/chills Eyes: No visual changes.  ENT: No neck pain Cardiovascular: Denies chest pain. Respiratory: Denies shortness of breath. Gastrointestinal: No abdominal pain.   Genitourinary: Negative for dysuria. Musculoskeletal: Toenail pain Skin: Negative for rash. Neurological: Negative for headaches   ____________________________________________   PHYSICAL EXAM:  VITAL SIGNS: ED Triage Vitals    Enc Vitals Group     BP 05/15/18 1252 123/89     Pulse Rate 05/15/18 1252 79     Resp 05/15/18 1252 16     Temp 05/15/18 1252 98.7 F (37.1 C)     Temp Source 05/15/18 1252 Oral     SpO2 05/15/18 1252 98 %     Weight 05/15/18 1254 73.5 kg (162 lb)     Height 05/15/18 1254 1.816 m (5' 11.5")     Head Circumference --      Peak Flow --      Pain Score 05/15/18 1254 0     Pain Loc --      Pain Edu? --      Excl. in [place]? --     Constitutional: Alert and oriented. No acute distress.  Eyes: Conjunctivae are normal.   Nose: No congestion/rhinnorhea. Mouth/Throat: Mucous membranes are moist.    Cardiovascular: Normal rate, regular rhythm. Grossly normal heart sounds.  Good peripheral circulation. Respiratory: Normal respiratory effort.  No retractions Gastrointestinal: Soft and nontender. No distention.   Musculoskeletal: No lower extremity tenderness nor edema.  Warm and well perfused.  Toenails are significantly overgrown Neurologic:  Normal speech and language. No gross focal neurologic deficits are appreciated.  Skin:  Skin is warm, dry and intact. No rash noted. Psychiatric: Mood and affect are normal. Speech and behavior are normal.  ____________________________________________   LABS (all labs ordered are listed, but only abnormal results are displayed)  Labs Reviewed  COMPREHENSIVE METABOLIC PANEL - Abnormal; Notable for the following components:      Result Value   Calcium 8.6 (*)    AST 13 (*)    All other components within normal limits  ETHANOL - Abnormal; Notable for the following components:   Alcohol, Ethyl (B) 140 (*)    All other components within normal limits  CBC - Abnormal; Notable for the following components:   RBC 2.96 (*)    Hemoglobin 10.8 (*)    HCT 31.4 (*)    MCV 105.8 (*)    MCH 36.5 (*)    RDW 18.1 (*)    All other components within normal limits  URINE DRUG SCREEN, QUALITATIVE ([facility] ONLY)    ____________________________________________  EKG  None ____________________________________________  RADIOLOGY  None ____________________________________________   PROCEDURES  Procedure(s) performed: No  Procedures   Critical Care performed: No ____________________________________________   INITIAL IMPRESSION / ASSESSMENT AND PLAN / ED COURSE  Pertinent labs & imaging results that were available during my care of the patient were reviewed by me and considered in my medical decision making (see chart for details).  Patient seen by Dr. Weber Cooks of psychiatry who feels the patient should not be committed and recommends reversal of commitment and discharged home.  Patient is medically cleared, he does have alcohol in his system however clinically is quite sober and does have decisional capacity    ____________________________________________   FINAL CLINICAL IMPRESSION(S) / ED DIAGNOSES  Final diagnoses:  Alcohol use        Note:  This document was prepared using Dragon voice recognition software and may include unintentional dictation errors.    Lavonia Drafts, MD 05/15/18 1451

## 2018-05-27 ENCOUNTER — Other Ambulatory Visit: Payer: Self-pay

## 2018-05-27 ENCOUNTER — Emergency Department
Admission: EM | Admit: 2018-05-27 | Discharge: 2018-05-27 | Disposition: A | Discharge status: Home / Self Care | Payer: Medicare HMO | Attending: Emergency Medicine | Admitting: Emergency Medicine

## 2018-05-27 ENCOUNTER — Encounter: Payer: Self-pay | Admitting: Emergency Medicine

## 2018-05-27 DIAGNOSIS — F0390 Unspecified dementia without behavioral disturbance: Secondary | ICD-10-CM | POA: Diagnosis not present

## 2018-05-27 DIAGNOSIS — R1110 Vomiting, unspecified: Secondary | ICD-10-CM | POA: Diagnosis present

## 2018-05-27 DIAGNOSIS — R42 Dizziness and giddiness: Secondary | ICD-10-CM | POA: Insufficient documentation

## 2018-05-27 DIAGNOSIS — R5383 Other fatigue: Secondary | ICD-10-CM | POA: Insufficient documentation

## 2018-05-27 DIAGNOSIS — R531 Weakness: Secondary | ICD-10-CM | POA: Insufficient documentation

## 2018-05-27 DIAGNOSIS — R251 Tremor, unspecified: Secondary | ICD-10-CM | POA: Diagnosis not present

## 2018-05-27 DIAGNOSIS — R61 Generalized hyperhidrosis: Secondary | ICD-10-CM | POA: Insufficient documentation

## 2018-05-27 DIAGNOSIS — R112 Nausea with vomiting, unspecified: Secondary | ICD-10-CM | POA: Insufficient documentation

## 2018-05-27 DIAGNOSIS — R05 Cough: Secondary | ICD-10-CM | POA: Diagnosis not present

## 2018-05-27 DIAGNOSIS — F17210 Nicotine dependence, cigarettes, uncomplicated: Secondary | ICD-10-CM | POA: Diagnosis not present

## 2018-05-27 DIAGNOSIS — Z85818 Personal history of malignant neoplasm of other sites of lip, oral cavity, and pharynx: Secondary | ICD-10-CM | POA: Diagnosis not present

## 2018-05-27 LAB — CBC
HCT: 37.4 % — ABNORMAL LOW (ref 40.0–52.0)
Hemoglobin: 12.9 g/dL — ABNORMAL LOW (ref 13.0–18.0)
MCH: 35.5 pg — ABNORMAL HIGH (ref 26.0–34.0)
MCHC: 34.3 g/dL (ref 32.0–36.0)
MCV: 103.3 fL — ABNORMAL HIGH (ref 80.0–100.0)
Platelets: 215 10*3/uL (ref 150–440)
RBC: 3.62 MIL/uL — ABNORMAL LOW (ref 4.40–5.90)
RDW: 17.5 % — ABNORMAL HIGH (ref 11.5–14.5)
WBC: 9.7 10*3/uL (ref 3.8–10.6)

## 2018-05-27 LAB — COMPREHENSIVE METABOLIC PANEL
ALT: 10 U/L (ref 0–44)
AST: 30 U/L (ref 15–41)
Albumin: 4.3 g/dL (ref 3.5–5.0)
Alkaline Phosphatase: 110 U/L (ref 38–126)
Anion gap: 14 (ref 5–15)
BUN: 17 mg/dL (ref 8–23)
CO2: 22 mmol/L (ref 22–32)
Calcium: 9.2 mg/dL (ref 8.9–10.3)
Chloride: 104 mmol/L (ref 98–111)
Creatinine, Ser: 1.11 mg/dL (ref 0.61–1.24)
GFR calc Af Amer: >60 mL/min (ref >60)
GFR calc non Af Amer: >60 mL/min (ref >60)
Glucose, Bld: 90 mg/dL (ref 70–99)
Potassium: 4.1 mmol/L (ref 3.5–5.1)
Sodium: 140 mmol/L (ref 135–145)
Total Bilirubin: 1.8 mg/dL — ABNORMAL HIGH (ref 0.3–1.2)
Total Protein: 7.9 g/dL (ref 6.5–8.1)

## 2018-05-27 LAB — URINALYSIS, COMPLETE (UACMP) WITH MICROSCOPIC
Bacteria, UA: NONE SEEN
Bilirubin Urine: NEGATIVE
Glucose, UA: NEGATIVE mg/dL
Hgb urine dipstick: NEGATIVE
Ketones, ur: 20 mg/dL — AB
Leukocytes, UA: NEGATIVE
Nitrite: NEGATIVE
Protein, ur: 30 mg/dL — AB
Specific Gravity, Urine: 1.019 (ref 1.005–1.030)
pH: 8 (ref 5.0–8.0)

## 2018-05-27 LAB — LIPASE, BLOOD: Lipase: 23 U/L (ref 11–51)

## 2018-05-27 LAB — TROPONIN I: Troponin I: <0.03 ng/mL (ref <0.03)

## 2018-05-27 MED ORDER — ONDANSETRON 4 MG PO TBDP
4.0000 mg | ORAL_TABLET | Freq: Once | ORAL | Status: DC | PRN
  Filled 2018-05-27: qty 1

## 2018-05-27 NOTE — ED Triage Notes (Signed)
Pt in via POV; reports sudden onset N/V this morning.  Vitals WDL; NAD noted at this time.

## 2018-05-27 NOTE — ED Provider Notes (Signed)
[facility] Emergency Department Provider Note ____________________________________________   First MD Initiated Contact with Patient 05/27/18 1335     (approximate)  I have reviewed the triage vital signs and the nursing notes.   HISTORY  Chief Complaint Emesis  HPI Keith Roberson is a 67 y.o. male with a history of dementia, seizures and throat cancer who was presented to the emergency department after an episode of vomiting.  He is accompanied by his sister who says that he had multiple episodes of dry heaves where he seemed like he was trying to cough something up.  She is concerned because of a history of cancer in her mother when she had fluid on her lungs sound like she was going to cough something up but could not cough it up.  Patient finally vomited the orange juice that he had had to drink this morning.  Since then he has not had any nausea, further cough or vomiting.  He says that he feels tired and weak diffusely.  Denies any chest pain.  Patient and sister state that he has forgotten treatment for throat cancer.  Sister also said that the patient became diaphoretic and lightheaded and had some shaking around the time where he vomited and she was concerned that he was going to have a seizure.  However, the patient never lost consciousness.  Past Medical History:  Diagnosis Date  . Dementia   . Seizures [facility])     Patient Active Problem List   Diagnosis Date Noted  . Special screening for malignant neoplasms, colon 10/23/2016    History reviewed. No pertinent surgical history.  Prior to Admission medications   Medication Sig Start Date End Date Taking? Authorizing Provider  ondansetron (ZOFRAN ODT) 4 MG disintegrating tablet Take 1 tablet (4 mg total) by mouth every 8 (eight) hours as needed for nausea or vomiting. 03/08/17   Long, Wonda Olds, MD    Allergies Patient has no known allergies.  No family history on file.  Social History Social  History   Tobacco Use  . Smoking status: Current Every Day Smoker    Packs/day: 0.50    Types: Cigarettes  . Smokeless tobacco: Never Used  Substance Use Topics  . Alcohol use: Yes  . Drug use: No    Review of Systems  Constitutional: No fever/chills Eyes: No visual changes. ENT: No sore throat. Cardiovascular: Denies chest pain. Respiratory: Denies shortness of breath. Gastrointestinal: No abdominal pain.    No diarrhea.  No constipation. Genitourinary: Negative for dysuria. Musculoskeletal: Negative for back pain. Skin: Negative for rash. Neurological: Negative for headaches, focal weakness or numbness.   ____________________________________________   PHYSICAL EXAM:  VITAL SIGNS: ED Triage Vitals  Enc Vitals Group     BP 05/27/18 1204 (!) 167/80     Pulse Rate 05/27/18 1204 91     Resp 05/27/18 1204 16     Temp 05/27/18 1204 98.4 F (36.9 C)     Temp Source 05/27/18 1204 Oral     SpO2 05/27/18 1204 100 %     Weight 05/27/18 1205 138 lb (62.6 kg)     Height 05/27/18 1205 5\' 11"  (1.803 m)     Head Circumference --      Peak Flow --      Pain Score 05/27/18 1205 0     Pain Loc --      Pain Edu? --      Excl. in [place]? --     Constitutional:  Alert and oriented. Well appearing and in no acute distress. Eyes: Conjunctivae are normal.  Head: Atraumatic. Nose: No congestion/rhinnorhea. Mouth/Throat: Mucous membranes are moist.  No swelling to the structures of the posterior pharynx. Neck: No stridor.   Cardiovascular: Normal rate, regular rhythm. Grossly normal heart sounds.   Respiratory: Normal respiratory effort.  No retractions. Lungs CTAB. Gastrointestinal: Soft and nontender. No distention. No CVA tenderness. Musculoskeletal: No lower extremity tenderness nor edema.  No joint effusions. Neurologic:  Normal speech and language. No gross focal neurologic deficits are appreciated. Skin:  Skin is warm, dry and intact. No rash noted. Psychiatric: Mood and affect  are normal. Speech and behavior are normal.  ____________________________________________   LABS (all labs ordered are listed, but only abnormal results are displayed)  Labs Reviewed  COMPREHENSIVE METABOLIC PANEL - Abnormal; Notable for the following components:      Result Value   Total Bilirubin 1.8 (*)    All other components within normal limits  CBC - Abnormal; Notable for the following components:   RBC 3.62 (*)    Hemoglobin 12.9 (*)    HCT 37.4 (*)    MCV 103.3 (*)    MCH 35.5 (*)    RDW 17.5 (*)    All other components within normal limits  URINALYSIS, COMPLETE (UACMP) WITH MICROSCOPIC - Abnormal; Notable for the following components:   Color, Urine YELLOW (*)    APPearance CLEAR (*)    Ketones, ur 20 (*)    Protein, ur 30 (*)    All other components within normal limits  LIPASE, BLOOD  TROPONIN I   ____________________________________________  EKG  ED ECG REPORT I, Doran Stabler, the attending physician, personally viewed and interpreted this ECG.   Date: 05/27/2018  EKG Time: 1441  Rate: 81  Rhythm: normal sinus rhythm  Axis: Normal  Intervals:none  ST&T Change: No ST segment elevation or depression.  No abnormal T wave inversion.  ____________________________________________  RADIOLOGY   ____________________________________________   PROCEDURES  Procedure(s) performed:   Procedures  Critical Care performed:   ____________________________________________   INITIAL IMPRESSION / ASSESSMENT AND PLAN / ED COURSE  Pertinent labs & imaging results that were available during my care of the patient were reviewed by me and considered in my medical decision making (see chart for details).  DDX: Nausea and vomiting, likely abnormality, near-syncope, UTI, ACS As part of my medical decision making, I reviewed the following data within the [facility] Notes from prior ED visits  ----------------------------------------- 3:28 PM  on 05/27/2018 -----------------------------------------  Patient at this time is tolerated p.o. fluids.  Very reassuring lab work and normal EKG.  Discussed with the patient as well as the family the plan for discharge to home.  We discussed return to the emergency department for any worsening or concerning symptoms.  Unclear what the etiology of the vomiting episode.  However, patient appears to be improving without any further symptoms and is now tolerating p.o.  No obvious infectious etiology to explain the weakness.  Possibly week secondary to vomiting episode. ____________________________________________   FINAL CLINICAL IMPRESSION(S) / ED DIAGNOSES  Nausea and vomiting.  Weakness.  NEW MEDICATIONS STARTED DURING THIS VISIT:  New Prescriptions   No medications on file     Note:  This document was prepared using Dragon voice recognition software and may include unintentional dictation errors.     Orbie Pyo, MD 05/27/18 [phone]

## 2018-05-27 NOTE — ED Notes (Signed)
Pt states, "I just went to the bathroom" in reference to urine specimen.  Will provide labeled specimen cup to patient and instruct to bring to First Nurse if able to provide sample.

## 2018-05-27 NOTE — ED Notes (Addendum)
Patient reports one episode of vomiting this morning after eating orange juice. Patient states if felt like he was getting strangled then couldn't get juice up. Patient denies any more episodes of vomiting and states he is not nauseous at this time. Per patient's family, patient was seen by Dr. Ihor Austin on 05/05/2018 at [facility] for CT scan and diagnosed with throat cancer.

## 2018-06-29 ENCOUNTER — Emergency Department (HOSPITAL_COMMUNITY): Payer: Medicare HMO

## 2018-06-29 ENCOUNTER — Emergency Department (HOSPITAL_COMMUNITY)
Admission: EM | Admit: 2018-06-29 | Discharge: 2018-06-29 | Disposition: A | Discharge status: Home / Self Care | Payer: Medicare HMO | Attending: Emergency Medicine | Admitting: Emergency Medicine

## 2018-06-29 ENCOUNTER — Encounter (HOSPITAL_COMMUNITY): Payer: Self-pay | Admitting: *Deleted

## 2018-06-29 DIAGNOSIS — F0390 Unspecified dementia without behavioral disturbance: Secondary | ICD-10-CM | POA: Insufficient documentation

## 2018-06-29 DIAGNOSIS — F17210 Nicotine dependence, cigarettes, uncomplicated: Secondary | ICD-10-CM | POA: Insufficient documentation

## 2018-06-29 DIAGNOSIS — R110 Nausea: Secondary | ICD-10-CM | POA: Diagnosis present

## 2018-06-29 LAB — COMPREHENSIVE METABOLIC PANEL
ALT: 10 U/L (ref 0–44)
AST: 23 U/L (ref 15–41)
Albumin: 4.4 g/dL (ref 3.5–5.0)
Alkaline Phosphatase: 104 U/L (ref 38–126)
Anion gap: 10 (ref 5–15)
BUN: 11 mg/dL (ref 8–23)
CO2: 25 mmol/L (ref 22–32)
Calcium: 9.2 mg/dL (ref 8.9–10.3)
Chloride: 105 mmol/L (ref 98–111)
Creatinine, Ser: 1.02 mg/dL (ref 0.61–1.24)
GFR calc Af Amer: >60 mL/min (ref >60)
GFR calc non Af Amer: >60 mL/min (ref >60)
Glucose, Bld: 104 mg/dL — ABNORMAL HIGH (ref 70–99)
Potassium: 4.3 mmol/L (ref 3.5–5.1)
Sodium: 140 mmol/L (ref 135–145)
Total Bilirubin: 1.3 mg/dL — ABNORMAL HIGH (ref 0.3–1.2)
Total Protein: 8.1 g/dL (ref 6.5–8.1)

## 2018-06-29 LAB — CBC WITH DIFFERENTIAL/PLATELET
Basophils Absolute: 0 10*3/uL (ref 0.0–0.1)
Basophils Relative: 0 %
Eosinophils Absolute: 0.1 10*3/uL (ref 0.0–0.7)
Eosinophils Relative: 1 %
HCT: 38.9 % — ABNORMAL LOW (ref 39.0–52.0)
Hemoglobin: 13.3 g/dL (ref 13.0–17.0)
Lymphocytes Relative: 29 %
Lymphs Abs: 3.2 10*3/uL (ref 0.7–4.0)
MCH: 35.2 pg — ABNORMAL HIGH (ref 26.0–34.0)
MCHC: 34.2 g/dL (ref 30.0–36.0)
MCV: 102.9 fL — ABNORMAL HIGH (ref 78.0–100.0)
Monocytes Absolute: 0.5 10*3/uL (ref 0.1–1.0)
Monocytes Relative: 5 %
Neutro Abs: 7.2 10*3/uL (ref 1.7–7.7)
Neutrophils Relative %: 65 %
Platelets: 169 10*3/uL (ref 150–400)
RBC: 3.78 MIL/uL — ABNORMAL LOW (ref 4.22–5.81)
RDW: 15.5 % (ref 11.5–15.5)
WBC: 11 10*3/uL — ABNORMAL HIGH (ref 4.0–10.5)

## 2018-06-29 LAB — LIPASE, BLOOD: Lipase: 26 U/L (ref 11–51)

## 2018-06-29 MED ORDER — ONDANSETRON 4 MG PO TBDP
4.0000 mg | ORAL_TABLET | Freq: Once | ORAL | Status: AC
  Administered 2018-06-29: 4 mg via ORAL
  Filled 2018-06-29: qty 1

## 2018-06-29 NOTE — Discharge Instructions (Addendum)
Take your Zofran every 6 hours as needed for nausea and follow-up with your family doctor this week

## 2018-06-29 NOTE — ED Provider Notes (Signed)
[facility] EMERGENCY DEPARTMENT Provider Note   CSN: [id number] Arrival date & time: 06/29/18  1743     History   Chief Complaint Chief Complaint  Patient presents with  . Nausea    HPI Kinsley H Lafever is a 67 y.o. male.  Patient vomited once today.  Patient complains of mild nausea  The history is provided by the patient and a relative. No language interpreter was used.  Illness  This is a new problem. The current episode started 12 to 24 hours ago. The problem occurs rarely. The problem has not changed since onset.Pertinent negatives include no chest pain, no abdominal pain and no headaches. Nothing aggravates the symptoms. Nothing relieves the symptoms. He has tried nothing for the symptoms. The treatment provided no relief.    Past Medical History:  Diagnosis Date  . Dementia   . Seizures [facility])     Patient Active Problem List   Diagnosis Date Noted  . Special screening for malignant neoplasms, colon 10/23/2016    No past surgical history on file.      Home Medications    Prior to Admission medications   Medication Sig Start Date End Date Taking? Authorizing Provider  ondansetron (ZOFRAN) 8 MG tablet Take 8 mg by mouth every 8 (eight) hours as needed for nausea or vomiting.   Yes [provider]    Family History No family history on file.  Social History Social History   Tobacco Use  . Smoking status: Current Every Day Smoker    Packs/day: 0.50    Types: Cigarettes  . Smokeless tobacco: Never Used  Substance Use Topics  . Alcohol use: Yes    Alcohol/week: 2.0 standard drinks    Types: 2 Glasses of wine per week  . Drug use: No     Allergies   Patient has no known allergies.   Review of Systems Review of Systems  Constitutional: Negative for appetite change and fatigue.  HENT: Negative for congestion, ear discharge and sinus pressure.   Eyes: Negative for discharge.  Respiratory: Negative for cough.   Cardiovascular: Negative for  chest pain.  Gastrointestinal: Positive for nausea. Negative for abdominal pain and diarrhea.  Genitourinary: Negative for frequency and hematuria.  Musculoskeletal: Negative for back pain.  Skin: Negative for rash.  Neurological: Negative for seizures and headaches.  Psychiatric/Behavioral: Negative for hallucinations.     Physical Exam Updated Vital Signs BP 129/88   Pulse 96   Temp 98.2 F (36.8 C) (Oral)   Resp 14   Ht 5' 11.5" (1.816 m)   Wt 63.5 kg   SpO2 99%   BMI 19.25 kg/m   Physical Exam  Constitutional: He is oriented to person, place, and time. He appears well-developed.  HENT:  Head: Normocephalic.  Eyes: Conjunctivae and EOM are normal. No scleral icterus.  Neck: Neck supple. No thyromegaly present.  Cardiovascular: Normal rate and regular rhythm. Exam reveals no gallop and no friction rub.  No murmur heard. Pulmonary/Chest: No stridor. He has no wheezes. He has no rales. He exhibits no tenderness.  Abdominal: He exhibits no distension. There is no tenderness. There is no rebound.  Musculoskeletal: Normal range of motion. He exhibits no edema.  Lymphadenopathy:    He has no cervical adenopathy.  Neurological: He is oriented to person, place, and time. He exhibits normal muscle tone. Coordination normal.  Skin: No rash noted. No erythema.  Psychiatric: He has a normal mood and affect. His behavior is normal.  ED Treatments / Results  Labs (all labs ordered are listed, but only abnormal results are displayed) Labs Reviewed  CBC WITH DIFFERENTIAL/PLATELET - Abnormal; Notable for the following components:      Result Value   WBC 11.0 (*)    RBC 3.78 (*)    HCT 38.9 (*)    MCV 102.9 (*)    MCH 35.2 (*)    All other components within normal limits  COMPREHENSIVE METABOLIC PANEL - Abnormal; Notable for the following components:   Glucose, Bld 104 (*)    Total Bilirubin 1.3 (*)    All other components within normal limits  LIPASE, BLOOD     EKG None  Radiology Dg Abd Acute W/chest  Result Date: 06/29/2018 CLINICAL DATA:  Shortness of Breath EXAM: DG ABDOMEN ACUTE W/ 1V CHEST COMPARISON:  03/08/2017 FINDINGS: Severe COPD. Heart is normal size. No confluent opacities or effusions. Scarring in the apices. Nodular densities projecting over both lower lung fields felt represent nipple shadows. There is normal bowel gas pattern. No free air. No organomegaly or suspicious calcification. No acute bony abnormality. IMPRESSION: Severe COPD.  No active cardiopulmonary disease. No evidence of bowel obstruction or free air. Electronically Signed   By: Rolm Baptise M.D.   On: 06/29/2018 19:26    Procedures Procedures (including critical care time)  Medications Ordered in ED Medications - No data to display   Initial Impression / Assessment and Plan / ED Course  I have reviewed the triage vital signs and the nursing notes.  Pertinent labs & imaging results that were available during my care of the patient were reviewed by me and considered in my medical decision making (see chart for details).     With nausea and vomiting times twice.  Labs unremarkable abdominal series normal.  Patient will be sent home with Zofran and follow-up with his PCP  Final Clinical Impressions(s) / ED Diagnoses   Final diagnoses:  Nausea    ED Discharge Orders    None       Milton Ferguson, MD 06/29/18 2041

## 2018-06-29 NOTE — ED Triage Notes (Signed)
Patient sister called EMS for nausea, "difficulty breathing" per sister.   No vomiting en route or none reported by family. Zofran given by family.  Patient with no noted distress.  100% room air.

## 2018-08-11 ENCOUNTER — Emergency Department: Payer: Medicare HMO

## 2018-08-11 ENCOUNTER — Encounter: Payer: Self-pay | Admitting: Emergency Medicine

## 2018-08-11 ENCOUNTER — Emergency Department
Admission: EM | Admit: 2018-08-11 | Discharge: 2018-08-11 | Disposition: A | Discharge status: Home / Self Care | Payer: Medicare HMO | Attending: Emergency Medicine | Admitting: Emergency Medicine

## 2018-08-11 DIAGNOSIS — R112 Nausea with vomiting, unspecified: Secondary | ICD-10-CM

## 2018-08-11 DIAGNOSIS — Z79899 Other long term (current) drug therapy: Secondary | ICD-10-CM | POA: Diagnosis not present

## 2018-08-11 DIAGNOSIS — E860 Dehydration: Secondary | ICD-10-CM | POA: Diagnosis not present

## 2018-08-11 DIAGNOSIS — K2920 Alcoholic gastritis without bleeding: Secondary | ICD-10-CM

## 2018-08-11 DIAGNOSIS — F17210 Nicotine dependence, cigarettes, uncomplicated: Secondary | ICD-10-CM | POA: Diagnosis not present

## 2018-08-11 DIAGNOSIS — R197 Diarrhea, unspecified: Secondary | ICD-10-CM

## 2018-08-11 DIAGNOSIS — F0390 Unspecified dementia without behavioral disturbance: Secondary | ICD-10-CM | POA: Diagnosis not present

## 2018-08-11 LAB — COMPREHENSIVE METABOLIC PANEL
ALT: 8 U/L (ref 0–44)
AST: 34 U/L (ref 15–41)
Albumin: 4.1 g/dL (ref 3.5–5.0)
Alkaline Phosphatase: 88 U/L (ref 38–126)
Anion gap: 10 (ref 5–15)
BUN: 20 mg/dL (ref 8–23)
CO2: 28 mmol/L (ref 22–32)
Calcium: 9.4 mg/dL (ref 8.9–10.3)
Chloride: 104 mmol/L (ref 98–111)
Creatinine, Ser: 1.2 mg/dL (ref 0.61–1.24)
GFR calc Af Amer: >60 mL/min (ref >60)
GFR calc non Af Amer: >60 mL/min (ref >60)
Glucose, Bld: 145 mg/dL — ABNORMAL HIGH (ref 70–99)
Potassium: 4.1 mmol/L (ref 3.5–5.1)
Sodium: 142 mmol/L (ref 135–145)
Total Bilirubin: 0.8 mg/dL (ref 0.3–1.2)
Total Protein: 7.7 g/dL (ref 6.5–8.1)

## 2018-08-11 LAB — INFLUENZA PANEL BY PCR (TYPE A & B)
Influenza A By PCR: NEGATIVE
Influenza B By PCR: NEGATIVE

## 2018-08-11 LAB — CBC
HCT: 39.8 % (ref 39.0–52.0)
Hemoglobin: 13.2 g/dL (ref 13.0–17.0)
MCH: 34 pg (ref 26.0–34.0)
MCHC: 33.2 g/dL (ref 30.0–36.0)
MCV: 102.6 fL — ABNORMAL HIGH (ref 80.0–100.0)
Platelets: 167 10*3/uL (ref 150–400)
RBC: 3.88 MIL/uL — ABNORMAL LOW (ref 4.22–5.81)
RDW: 14.4 % (ref 11.5–15.5)
WBC: 10.1 10*3/uL (ref 4.0–10.5)
nRBC: 0 % (ref 0.0–0.2)

## 2018-08-11 LAB — TROPONIN I: Troponin I: 0.03 ng/mL (ref <0.03)

## 2018-08-11 LAB — LIPASE, BLOOD: Lipase: 29 U/L (ref 11–51)

## 2018-08-11 MED ORDER — FAMOTIDINE 20 MG PO TABS: 20.0000 mg | ORAL_TABLET | Freq: Two times a day (BID) | ORAL | 0 refills | Status: DC

## 2018-08-11 MED ORDER — ONDANSETRON HCL 4 MG/2ML IJ SOLN
4.0000 mg | Freq: Once | INTRAMUSCULAR | Status: AC
  Administered 2018-08-11: 4 mg via INTRAVENOUS
  Filled 2018-08-11: qty 2

## 2018-08-11 MED ORDER — ONDANSETRON 4 MG PO TBDP: 4.0000 mg | ORAL_TABLET | Freq: Three times a day (TID) | ORAL | 0 refills | Status: DC | PRN

## 2018-08-11 MED ORDER — SODIUM CHLORIDE 0.9 % IV BOLUS
1000.0000 mL | Freq: Once | INTRAVENOUS | Status: AC
  Administered 2018-08-11: 1000 mL via INTRAVENOUS

## 2018-08-11 NOTE — Care Management Note (Signed)
Case Management Note  Patient Details  Name: Keith Roberson MRN: [id number] Date of Birth: 1951/07/09  Subjective/Objective:        Patient is being seen in the ED for Nausea and vomiting.  This is the 4th ED visit in the past 6 months.  Patient was sitting up in bed, crackers and ginger ale on the bedside table.  Family is at the bedside, 2 sisters and a brother in Sports coach.  I asked if it was okay that the family remain in the room for our conversation.  I told the patient that I wanted to speak with him because I noticed that he has been to the ED 4 times in the past 6 months and I wanted to assess for any needs. Patient has a PCP Dr. Abran Richard in [place].  The patients sister [name] reports that he last visited Dr Yong Channel last week but he usually only goes when he does not feel.  Today the sister tells me that they did contact Dr. Yong Channel and they were told to go to the Urgent Care or ED.  They came to the ED because the patient was having palpitations.  The patient is a poor historian and cannot answer most of my questions so his sister answers for him.  He lives alone, he does not drive.  His siblings will take turns driving him to wherever he needs to go.  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  Patient uses [facility] in [place], he only takes zofran per pt and family.  He states he smokes half a pack of cigarettes a day.  His sister [name] tells me that he drinks alcohol, I ask the patient about alcohol use and he states he does not drink much, I offer information on counseling for smoking and for alcohol abuse but the patient declines.   Patient will be discharged home per Attending Dr. Joni Fears.  I told the sisters to schedule a follow up appointment with Dr. Yong Channel for later this week or next as a follow up.  The said they would.                Action/Plan:   Expected Discharge Date:   08/10/18               Expected Discharge Plan:  Home/Self Care  In-House Referral:     Discharge planning Services  CM Consult  Post Acute Care Choice:    Choice offered to:     DME Arranged:    DME Agency:     HH Arranged:    HH Agency:     Status of Service:  Completed, signed off  If discussed at [employer] of Stay Meetings, dates discussed:    Additional Comments:  Shelbie Hutching, RN 08/11/2018, 3:04 PM

## 2018-08-11 NOTE — ED Notes (Signed)
Patient transported to X-ray 

## 2018-08-11 NOTE — ED Notes (Signed)
Patient tolerated crackers and ginger ale well.

## 2018-08-11 NOTE — ED Provider Notes (Addendum)
[facility] Emergency Department Provider Note  ____________________________________________  Time seen: Approximately 2:47 PM  I have reviewed the triage vital signs and the nursing notes.   HISTORY  Chief Complaint Emesis; Diarrhea; and Weakness    HPI Keith Roberson is a 67 y.o. male with a history of dementia and seizures who is brought to the ED by family due to concerns complains of malaise and fatigue over the past day.  Also reports of nausea vomiting and diarrhea this morning.  Family reports that the patient has a history of throat cancer but not receiving any treatment.  Patient denies having cancer.  Denies any chest pain shortness of breath belly pain or back pain.  No dizziness.  States that he feels "okay".  Malaise is constant.  No radiating pain.  No aggravating or alleviating factors.   Past Medical History:  Diagnosis Date  . Dementia ([place])   . Seizures [facility])      Patient Active Problem List   Diagnosis Date Noted  . Special screening for malignant neoplasms, colon 10/23/2016     History reviewed. No pertinent surgical history.   Prior to Admission medications   Medication Sig Start Date End Date Taking? Authorizing Provider  ondansetron (ZOFRAN) 8 MG tablet Take 8 mg by mouth every 8 (eight) hours as needed for nausea or vomiting.   Yes [provider]  famotidine (PEPCID) 20 MG tablet Take 1 tablet (20 mg total) by mouth 2 (two) times daily. 08/11/18   Carrie Mew, MD  ondansetron (ZOFRAN ODT) 4 MG disintegrating tablet Take 1 tablet (4 mg total) by mouth every 8 (eight) hours as needed for nausea or vomiting. 08/11/18   Carrie Mew, MD  ranitidine (ZANTAC) 150 MG tablet Take 1 tablet by mouth 2 (two) times daily. 07/21/18   [provider]     Allergies Patient has no known allergies.   No family history on file.  Social History Social History   Tobacco Use  . Smoking status: Current Every  Day Smoker    Packs/day: 0.50    Types: Cigarettes  . Smokeless tobacco: Never Used  Substance Use Topics  . Alcohol use: Yes    Alcohol/week: 2.0 standard drinks    Types: 2 Glasses of wine per week  . Drug use: No    Review of Systems  Constitutional:   No fever or chills.  ENT:   No sore throat. No rhinorrhea. Cardiovascular:   No chest pain or syncope. Respiratory:   No dyspnea or cough. Gastrointestinal:   Negative for abdominal pain, positive vomiting and diarrhea.  Musculoskeletal:   Negative for focal pain or swelling All other systems reviewed and are negative except as documented above in ROS and HPI.  ____________________________________________   PHYSICAL EXAM:  VITAL SIGNS: ED Triage Vitals  Enc Vitals Group     BP 08/11/18 1015 (!) 143/92     Pulse Rate 08/11/18 1015 87     Resp 08/11/18 1015 18     Temp 08/11/18 1015 99.7 F (37.6 C)     Temp Source 08/11/18 1015 Oral     SpO2 08/11/18 1015 99 %     Weight 08/11/18 1013 132 lb (59.9 kg)     Height 08/11/18 1013 5' 11.5" (1.816 m)     Head Circumference --      Peak Flow --      Pain Score 08/11/18 1012 0     Pain Loc --  Pain Edu? --      Excl. in [place]? --     Vital signs reviewed, nursing assessments reviewed.   Constitutional:   Alert and oriented. Non-toxic appearance. Eyes:   Conjunctivae are normal. EOMI. PERRL. ENT      Head:   Normocephalic and atraumatic.      Nose:   No congestion/rhinnorhea.       Mouth/Throat:   Dry mucous members, no pharyngeal erythema. No peritonsillar mass.       Neck:   No meningismus. Full ROM. Hematological/Lymphatic/Immunilogical:   No cervical lymphadenopathy. Cardiovascular:   RRR. Symmetric bilateral radial and DP pulses.  No murmurs. Cap refill less than 2 seconds. Respiratory:   Normal respiratory effort without tachypnea/retractions. Breath sounds are clear and equal bilaterally. No wheezes/rales/rhonchi. Gastrointestinal:   Soft and nontender. Non  distended. There is no CVA tenderness.  No rebound, rigidity, or guarding. Musculoskeletal:   Normal range of motion in all extremities. No joint effusions.  No lower extremity tenderness.  No edema. Neurologic:   Normal speech and language.  Motor grossly intact. No acute focal neurologic deficits are appreciated.  Skin:    Skin is warm, dry and intact. No rash noted.  No petechiae, purpura, or bullae.  ____________________________________________    LABS (pertinent positives/negatives) (all labs ordered are listed, but only abnormal results are displayed) Labs Reviewed  COMPREHENSIVE METABOLIC PANEL - Abnormal; Notable for the following components:      Result Value   Glucose, Bld 145 (*)    All other components within normal limits  CBC - Abnormal; Notable for the following components:   RBC 3.88 (*)    MCV 102.6 (*)    All other components within normal limits  TROPONIN I - Abnormal; Notable for the following components:   Troponin I 0.03 (*)    All other components within normal limits  LIPASE, BLOOD  INFLUENZA PANEL BY PCR (TYPE A & B)  URINALYSIS, COMPLETE (UACMP) WITH MICROSCOPIC   ____________________________________________   EKG  Interpreted by me Sinus rhythm rate of 87, normal axis intervals QRS ST segments and T waves.  ____________________________________________    RADIOLOGY  Dg Chest 2 View  Result Date: 08/11/2018 CLINICAL DATA:  Not feeling well for the last day. Nausea, vomiting and diarrhea. Esophageal cancer. EXAM: CHEST - 2 VIEW COMPARISON:  Radiographs 06/29/2018 and 03/08/2017. FINDINGS: The heart size and mediastinal contours are stable. There is no significant esophageal dilatation. There is mild chronic lung disease with probable emphysema, biapical scarring and central airway thickening. There are stable nodular densities projecting over both lung bases, likely nipple shadows. Old rib fractures are noted bilaterally. No acute osseous findings  are seen. IMPRESSION: Stable chest. No evidence of active cardiopulmonary process or metastatic disease. Probable emphysema. Electronically Signed   By: Richardean Sale M.D.   On: 08/11/2018 10:52    ____________________________________________   PROCEDURES Procedures  ____________________________________________  DIFFERENTIAL DIAGNOSIS   Dehydration, viral syndrome, electrolyte abnormality.  CLINICAL IMPRESSION / ASSESSMENT AND PLAN / ED COURSE  Pertinent labs & imaging results that were available during my care of the patient were reviewed by me and considered in my medical decision making (see chart for details).    Patient presents with nausea vomiting diarrhea.  No belly pain.  Abdominal exam is benign.  Vital signs are unremarkable, the rest of exam is also unremarkable.  Labs are normal.  Treat the patient symptomatically for presumed norovirus infection which is prevalent at this time in  the community.  After hydration and symptomatic management, patient is tolerating oral intake.  Patient and family updated at the bedside, stable for discharge home and outpatient follow-up.Considering the patient's symptoms, medical history, and physical examination today, I have low suspicion for cholecystitis or biliary pathology, pancreatitis, perforation or bowel obstruction, hernia, intra-abdominal abscess, AAA or dissection, volvulus or intussusception, mesenteric ischemia, or appendicitis.    Clinical Course as of Aug 11 1510  Tue Aug 11, 2018  1503 RBC(!): 3.88 [PS]    Clinical Course User Index [PS] Carrie Mew, MD      ----------------------------------------- 3:12 PM on 08/11/2018 -----------------------------------------  Family later confided in me that patient goes on binge drinking episodes 2 times a month including over the last 4 days in which she does not eat or drink anything else.  Consistent with dehydration and can be expected to resolve now that the patient  is tolerating oral intake and been rehydrated. ____________________________________________   FINAL CLINICAL IMPRESSION(S) / ED DIAGNOSES    Final diagnoses:  Dehydration  Nausea vomiting and diarrhea  Acute alcoholic gastritis without hemorrhage     ED Discharge Orders         Ordered    ondansetron (ZOFRAN ODT) 4 MG disintegrating tablet  Every 8 hours PRN     08/11/18 1446    famotidine (PEPCID) 20 MG tablet  2 times daily     08/11/18 1446          Portions of this note were generated with dragon dictation software. Dictation errors may occur despite best attempts at proofreading.    Carrie Mew, MD 08/11/18 1450    Carrie Mew, MD 08/11/18 [phone]

## 2018-08-11 NOTE — Discharge Instructions (Signed)
Your labs today were normal.  Take Pepcid and Zofran as prescribed to soothe your stomach and control your symptoms so that you can maintain good hydration until this illness runs its course.  Follow-up with your doctor this week.  Return to the ED if your symptoms are worsening.

## 2018-08-11 NOTE — ED Notes (Signed)
Patient given ginger ale and crackers. Family at bedside.

## 2018-08-11 NOTE — ED Triage Notes (Signed)
Pt arrives via ems from sister's house with complaints of "not feeling well" for the last day. Pt reports n/v/d and taking zofran this am. 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 Pt arrives alert and oriented.

## 2018-08-11 NOTE — Care Management Note (Signed)
Case Management Note  Patient Details  Name: Keith Roberson MRN: [id number] Date of Birth: [birth year]  Subjective/Objective:           Verified address on file as sister Loretta's address, this is where he gets his mail.  His home address is [place], [place], [place] [address].  Phone number on file for patient is also his sister Buford Dresser his number is [phone].             Action/Plan:   Expected Discharge Date:                  Expected Discharge Plan:  Home/Self Care  In-House Referral:     Discharge planning Services  CM Consult  Post Acute Care Choice:    Choice offered to:     DME Arranged:    DME Agency:     HH Arranged:    HH Agency:     Status of Service:  Completed, signed off  If discussed at [employer] of Stay Meetings, dates discussed:    Additional Comments:  Shelbie Hutching, RN 08/11/2018, 3:32 PM

## 2018-10-06 ENCOUNTER — Emergency Department
Admission: EM | Admit: 2018-10-06 | Discharge: 2018-10-07 | Disposition: A | Discharge status: Home / Self Care | Payer: Medicare HMO | Attending: Emergency Medicine | Admitting: Emergency Medicine

## 2018-10-06 ENCOUNTER — Encounter: Payer: Self-pay | Admitting: Emergency Medicine

## 2018-10-06 ENCOUNTER — Other Ambulatory Visit: Payer: Self-pay

## 2018-10-06 DIAGNOSIS — F1010 Alcohol abuse, uncomplicated: Secondary | ICD-10-CM | POA: Diagnosis not present

## 2018-10-06 DIAGNOSIS — Y907 Blood alcohol level of 200-239 mg/100 ml: Secondary | ICD-10-CM | POA: Diagnosis not present

## 2018-10-06 DIAGNOSIS — F10929 Alcohol use, unspecified with intoxication, unspecified: Secondary | ICD-10-CM

## 2018-10-06 DIAGNOSIS — Z79899 Other long term (current) drug therapy: Secondary | ICD-10-CM | POA: Insufficient documentation

## 2018-10-06 DIAGNOSIS — F17210 Nicotine dependence, cigarettes, uncomplicated: Secondary | ICD-10-CM | POA: Diagnosis not present

## 2018-10-06 DIAGNOSIS — F1020 Alcohol dependence, uncomplicated: Secondary | ICD-10-CM | POA: Diagnosis not present

## 2018-10-06 DIAGNOSIS — R569 Unspecified convulsions: Secondary | ICD-10-CM

## 2018-10-06 DIAGNOSIS — F0390 Unspecified dementia without behavioral disturbance: Secondary | ICD-10-CM | POA: Diagnosis not present

## 2018-10-06 DIAGNOSIS — F10129 Alcohol abuse with intoxication, unspecified: Secondary | ICD-10-CM | POA: Diagnosis not present

## 2018-10-06 LAB — COMPREHENSIVE METABOLIC PANEL
ALT: 8 U/L (ref 0–44)
AST: 17 U/L (ref 15–41)
Albumin: 4.2 g/dL (ref 3.5–5.0)
Alkaline Phosphatase: 69 U/L (ref 38–126)
Anion gap: 8 (ref 5–15)
BUN: 14 mg/dL (ref 8–23)
CO2: 27 mmol/L (ref 22–32)
Calcium: 9.3 mg/dL (ref 8.9–10.3)
Chloride: 111 mmol/L (ref 98–111)
Creatinine, Ser: 1 mg/dL (ref 0.61–1.24)
GFR calc Af Amer: >60 mL/min (ref >60)
GFR calc non Af Amer: >60 mL/min (ref >60)
Glucose, Bld: 89 mg/dL (ref 70–99)
Potassium: 4.8 mmol/L (ref 3.5–5.1)
Sodium: 146 mmol/L — ABNORMAL HIGH (ref 135–145)
Total Bilirubin: 0.3 mg/dL (ref 0.3–1.2)
Total Protein: 7.7 g/dL (ref 6.5–8.1)

## 2018-10-06 LAB — CBC
HCT: 35.8 % — ABNORMAL LOW (ref 39.0–52.0)
Hemoglobin: 11.4 g/dL — ABNORMAL LOW (ref 13.0–17.0)
MCH: 33.3 pg (ref 26.0–34.0)
MCHC: 31.8 g/dL (ref 30.0–36.0)
MCV: 104.7 fL — ABNORMAL HIGH (ref 80.0–100.0)
Platelets: 234 10*3/uL (ref 150–400)
RBC: 3.42 MIL/uL — ABNORMAL LOW (ref 4.22–5.81)
RDW: 14.1 % (ref 11.5–15.5)
WBC: 7.5 10*3/uL (ref 4.0–10.5)
nRBC: 0 % (ref 0.0–0.2)

## 2018-10-06 LAB — ETHANOL: Alcohol, Ethyl (B): 228 mg/dL — ABNORMAL HIGH (ref <10)

## 2018-10-06 MED ORDER — PHENYTOIN SODIUM EXTENDED 100 MG PO CAPS
200.0000 mg | ORAL_CAPSULE | Freq: Every day | ORAL | Status: DC
  Administered 2018-10-06: 200 mg via ORAL
  Filled 2018-10-06 (×2): qty 2

## 2018-10-06 NOTE — ED Notes (Signed)
Hourly rounding reveals patient in room. No complaints, stable, in no acute distress. Q15 minute rounds and monitoring via Rover and Officer to continue.   

## 2018-10-06 NOTE — Consult Note (Signed)
[facility] Face-to-Face Psychiatry Consult   Reason for Consult: Consult for this 67 year old man brought here under involuntary commitment filed by his family because of alcohol abuse Referring Physician: Paduchowski Patient Identification: Keith Roberson MRN:  [id number] Principal Diagnosis: Alcohol abuse Diagnosis:  Principal Problem:   Alcohol abuse Active Problems:   Alcohol intoxication ([place])   Dementia ([place])   Seizures ([place])   Total Time spent with patient: 1 hour  Subjective:   Keith Roberson is a 67 y.o. male patient admitted with "I do not know".  HPI: Patient seen chart reviewed.  67 year old man with a history of alcohol abuse.  Family took out commitment papers stating that he is drinking heavily and that as a result he is not safe to care for himself.  Claimed that he is passing out outdoors in the cold and leaving fires and space heaters in dangerous situations inside the house.  Patient denies every bit of this.  He says he has no problems.  When I asked him about his family he tells me they just want to take his house away.  He claims that he drinks "just a little".  He will not be any more specific than that although he did claim that he was only drinking about 2 days/week.  Patient's blood alcohol level on presentation is over 200.  Patient denies being depressed denies any suicidal or homicidal thoughts.  Denies any hallucinations.  Denies being on any prescription medicine.  He is only slightly cooperative with the interview.  Makes very little eye contact.  Mutters most of his answers.  He knew where he was but his short-term memory was poor could not remember any of 3 words after just about 2 minutes.  Social history: Lives by himself in a house.  Family have been maintaining that he is not capable of safely living there for years it looks like.  Medical history: Family claims that the patient has throat cancer.  He denies it.  Looking through his old chart I do not see the  specific record of it anywhere.  He does admit that he has had seizures in the past which was a previous diagnosis.  He says he is on no current medicine.  Substance abuse history: Long history of alcohol abuse.  Has had seizures in the past probably at least partially related to alcohol withdrawal.  Does not know of any history of DTs.  Patient has no insight into his substance abuse.  Past Psychiatric History: No evidence that I see of any psychiatric history other than alcohol abuse and dementia.  No history of suicide attempts or violence.  Patient has been seen by psychiatry consultants here at the hospital in the past primarily around his alcohol abuse.  Does not look like we have ever admitted him for it.  Risk to Self:   Risk to Others:   Prior Inpatient Therapy:   Prior Outpatient Therapy:    Past Medical History:  Past Medical History:  Diagnosis Date  . Dementia ([place])   . Seizures ([place])    History reviewed. No pertinent surgical history. Family History: No family history on file. Family Psychiatric  History: Unknown Social History:  Social History   Substance and Sexual Activity  Alcohol Use Yes  . Alcohol/week: 2.0 standard drinks  . Types: 2 Glasses of wine per week     Social History   Substance and Sexual Activity  Drug Use No    Social History   Socioeconomic  History  . Marital status: Divorced    Spouse name: Not on file  . Number of children: Not on file  . Years of education: Not on file  . Highest education level: Not on file  Occupational History  . Not on file  Social Needs  . Financial resource strain: Not on file  . Food insecurity:    Worry: Not on file    Inability: Not on file  . Transportation needs:    Medical: Not on file    Non-medical: Not on file  Tobacco Use  . Smoking status: Current Every Day Smoker    Packs/day: 0.50    Types: Cigarettes  . Smokeless tobacco: Never Used  Substance and Sexual Activity  . Alcohol use: Yes     Alcohol/week: 2.0 standard drinks    Types: 2 Glasses of wine per week  . Drug use: No  . Sexual activity: Not on file  Lifestyle  . Physical activity:    Days per week: Not on file    Minutes per session: Not on file  . Stress: Not on file  Relationships  . Social connections:    Talks on phone: Not on file    Gets together: Not on file    Attends religious service: Not on file    Active member of club or organization: Not on file    Attends meetings of clubs or organizations: Not on file    Relationship status: Not on file  Other Topics Concern  . Not on file  Social History Narrative  . Not on file   Additional Social History:    Allergies:  No Known Allergies  Labs:  Results for orders placed or performed during the hospital encounter of 10/06/18 (from the past 48 hour(s))  Comprehensive metabolic panel     Status: Abnormal   Collection Time: 10/06/18  1:34 PM  Result Value Ref Range   Sodium 146 (H) 135 - 145 mmol/L   Potassium 4.8 3.5 - 5.1 mmol/L   Chloride 111 98 - 111 mmol/L   CO2 27 22 - 32 mmol/L   Glucose, Bld 89 70 - 99 mg/dL   BUN 14 8 - 23 mg/dL   Creatinine, Ser 1.00 0.61 - 1.24 mg/dL   Calcium 9.3 8.9 - 10.3 mg/dL   Total Protein 7.7 6.5 - 8.1 g/dL   Albumin 4.2 3.5 - 5.0 g/dL   AST 17 15 - 41 U/L   ALT 8 0 - 44 U/L   Alkaline Phosphatase 69 38 - 126 U/L   Total Bilirubin 0.3 0.3 - 1.2 mg/dL   GFR calc non Af Amer >60 >60 mL/min   GFR calc Af Amer >60 >60 mL/min   Anion gap 8 5 - 15    Comment: Performed at [facility], [address] [place], [place] [address]  Ethanol     Status: Abnormal   Collection Time: 10/06/18  1:34 PM  Result Value Ref Range   Alcohol, Ethyl (B) 228 (H) <10 mg/dL    Comment: (NOTE) Lowest detectable limit for serum alcohol is 10 mg/dL. For medical purposes only. Performed at [facility], [address] [place], [place] [address]   cbc     Status: Abnormal   Collection Time: 10/06/18  1:34  PM  Result Value Ref Range   WBC 7.5 4.0 - 10.5 K/uL   RBC 3.42 (L) 4.22 - 5.81 MIL/uL   Hemoglobin 11.4 (L) 13.0 - 17.0 g/dL  HCT 35.8 (L) 39.0 - 52.0 %   MCV 104.7 (H) 80.0 - 100.0 fL   MCH 33.3 26.0 - 34.0 pg   MCHC 31.8 30.0 - 36.0 g/dL   RDW 14.1 11.5 - 15.5 %   Platelets 234 150 - 400 K/uL   nRBC 0.0 0.0 - 0.2 %    Comment: Performed at [facility], [address] [place], [place] [address]    Current Facility-Administered Medications  Medication Dose Route Frequency Provider Last Rate Last Dose  . phenytoin (DILANTIN) ER capsule 200 mg  200 mg Oral QHS Deacon Gadbois T, MD       Current Outpatient Medications  Medication Sig Dispense Refill  . famotidine (PEPCID) 20 MG tablet Take 1 tablet (20 mg total) by mouth 2 (two) times daily. 60 tablet 0  . ondansetron (ZOFRAN ODT) 4 MG disintegrating tablet Take 1 tablet (4 mg total) by mouth every 8 (eight) hours as needed for nausea or vomiting. 20 tablet 0  . ondansetron (ZOFRAN) 8 MG tablet Take 8 mg by mouth every 8 (eight) hours as needed for nausea or vomiting.    . ranitidine (ZANTAC) 150 MG tablet Take 1 tablet by mouth 2 (two) times daily.  2    Musculoskeletal: Strength & Muscle Tone: decreased Gait & Station: unsteady Patient leans: N/A  Psychiatric Specialty Exam: Physical Exam  Nursing note and vitals reviewed. Constitutional: He appears well-developed.  HENT:  Head: Normocephalic and atraumatic.  Eyes: Pupils are equal, round, and reactive to light. Conjunctivae are normal.  Neck: Normal range of motion.  Cardiovascular: Regular rhythm and normal heart sounds.  Respiratory: Effort normal. No respiratory distress.  GI: Soft.  Musculoskeletal: Normal range of motion.  Neurological: He is alert.  Skin: Skin is warm and dry.  Psychiatric: His affect is blunt. His speech is delayed. He is slowed. He is not agitated. Cognition and memory are impaired. He expresses inappropriate judgment. He expresses  no homicidal and no suicidal ideation. He exhibits abnormal recent memory and abnormal remote memory.    Review of Systems  Constitutional: Negative.   HENT: Negative.   Eyes: Negative.   Respiratory: Negative.   Cardiovascular: Negative.   Gastrointestinal: Negative.   Musculoskeletal: Negative.   Skin: Negative.   Neurological: Negative.   Psychiatric/Behavioral: Negative.     Blood pressure (!) 133/58, pulse 79, temperature 97.8 F (36.6 C), temperature source Oral, resp. rate 16, height 6' (1.829 m), weight 63.5 kg, SpO2 99 %.Body mass index is 18.99 kg/m.  General Appearance: Disheveled  Eye Contact:  None  Speech:  Slow and Slurred  Volume:  Decreased  Mood:  Euthymic  Affect:  Constricted  Thought Process:  Coherent  Orientation:  Full (Time, Place, and Person)  Thought Content:  Rumination  Suicidal Thoughts:  No  Homicidal Thoughts:  No  Memory:  Immediate;   Fair Recent;   Fair Remote;   Fair  Judgement:  Impaired  Insight:  Shallow  Psychomotor Activity:  Decreased  Concentration:  Concentration: Poor  Recall:  Poor  Fund of Knowledge:  Poor  Language:  Poor  Akathisia:  No  Handed:  Right  AIMS (if indicated):     Assets:  Housing  ADL's:  Impaired  Cognition:  Impaired,  Mild  Sleep:        Treatment Plan Summary: Daily contact with patient to assess and evaluate symptoms and progress in treatment, Medication management and Plan Patient with alcohol abuse and dementia.  Currently  intoxicated.  Patient is denying any problems.  No evidence of his being suicidal or violent.  I have ordered a Dilantin level but gone ahead and ordered his 200 mg at night of Dilantin that he used to take.  We will monitor him while he sobers up.  I will reevaluate and I suspect we can probably discharge him once he is sober.  Unlikely to meet criteria for commitment at that point.  Disposition: Patient does not meet criteria for psychiatric inpatient admission.  Alethia Berthold, MD 10/06/2018 4:57 PM

## 2018-10-06 NOTE — ED Notes (Signed)
Report to include Situation, Background, Assessment, and Recommendations received from Amy RN. Patient alert and oriented, warm and dry, in no acute distress. Patient denies SI, HI, AVH and pain. Patient made aware of Q15 minute rounds and Rover and Officer presence for their safety. Patient instructed to come to me with needs or concerns.   

## 2018-10-06 NOTE — ED Notes (Signed)
Gave pt food tray with juice.

## 2018-10-06 NOTE — ED Notes (Signed)
BEHAVIORAL HEALTH ROUNDING Patient sleeping: No. Patient alert and oriented: yes Behavior appropriate: Yes.  ; If no, describe:  Nutrition and fluids offered: yes Toileting and hygiene offered: Yes  Sitter present: q15 minute observations and security  monitoring Law enforcement present: Yes  ODS  

## 2018-10-06 NOTE — ED Notes (Signed)

## 2018-10-06 NOTE — ED Triage Notes (Signed)
Brought under IVC for alcohol abuse.

## 2018-10-06 NOTE — BH Assessment (Signed)
Assessment Note  Keith Roberson is an 67 y.o. male.   Pt not willing to engage in TTS assessment. Pt appeared to be agitated that he's here in the hospital.  When this writer asked pt to explain why he was in the hospital he responded, "I don't know, you tell me".  Diagnosis: Dementia, by history Alcohol Use Disorder, unspecified  Past Medical History:  Past Medical History:  Diagnosis Date  . Dementia ([place])   . Seizures ([place])     History reviewed. No pertinent surgical history.  Family History: No family history on file.  Social History:  reports that he has been smoking cigarettes. He has been smoking about 0.50 packs per day. He has never used smokeless tobacco. He reports current alcohol use of about 2.0 standard drinks of alcohol per week. He reports that he does not use drugs.  Additional Social History:     CIWA: CIWA-Ar BP: (!) 133/58 Pulse Rate: 79 COWS:    Allergies: No Known Allergies  Home Medications: (Not in a hospital admission)   OB/GYN Status:  No LMP for male patient.  General Assessment Data Assessment unable to be completed: Yes Reason for not completing assessment: Pt not willing to engage in TTS assessment. Pt appeared to be agitated that he's here in the hospital. Admission Status: Involuntary                    Mental Status Report Motor Activity: Freedom of movement                                       Disposition:     On Site Evaluation by:   Reviewed with Physician:    Frederich Cha 10/06/2018 6:20 PM

## 2018-10-06 NOTE — ED Notes (Signed)
Pt just walked to bathroom. Pt Walked back to bed is resting and watching TV.

## 2018-10-06 NOTE — ED Notes (Signed)
Pt. Transferred to [facility] from ED to room 2 after screening for contraband. Report to include Situation, Background, Assessment and Recommendations from [place]. Pt. Oriented to unit including Q15 minute rounds as well as the security cameras for their protection. Patient is alert and oriented, warm and dry in no acute distress. Patient denies SI, HI, and AVH. Pt. Encouraged to let me know if needs arise.

## 2018-10-06 NOTE — ED Provider Notes (Signed)
[facility] Emergency Department Provider Note   ____________________________________________    I have reviewed the triage vital signs and the nursing notes.   HISTORY  Chief Complaint Psychiatric Evaluation   History significantly limited HPI Jvion H Ginley is a 67 y.o. male with an apparent history of dementia who was brought to the emergency department under IVC for EtOH abuse.  Patient is not willing or unable to give any further history   Past Medical History:  Diagnosis Date  . Dementia ([place])   . Seizures [facility])     Patient Active Problem List   Diagnosis Date Noted  . Special screening for malignant neoplasms, colon 10/23/2016    History reviewed. No pertinent surgical history.  Prior to Admission medications   Medication Sig Start Date End Date Taking? Authorizing Provider  famotidine (PEPCID) 20 MG tablet Take 1 tablet (20 mg total) by mouth 2 (two) times daily. 08/11/18   Carrie Mew, MD  ondansetron (ZOFRAN ODT) 4 MG disintegrating tablet Take 1 tablet (4 mg total) by mouth every 8 (eight) hours as needed for nausea or vomiting. 08/11/18   Carrie Mew, MD  ondansetron (ZOFRAN) 8 MG tablet Take 8 mg by mouth every 8 (eight) hours as needed for nausea or vomiting.    [provider]  ranitidine (ZANTAC) 150 MG tablet Take 1 tablet by mouth 2 (two) times daily. 07/21/18   [provider]     Allergies Patient has no known allergies.  No family history on file.  Social History Social History   Tobacco Use  . Smoking status: Current Every Day Smoker    Packs/day: 0.50    Types: Cigarettes  . Smokeless tobacco: Never Used  Substance Use Topics  . Alcohol use: Yes    Alcohol/week: 2.0 standard drinks    Types: 2 Glasses of wine per week  . Drug use: No    Unable to obtain review of Systems     ____________________________________________   PHYSICAL EXAM:  VITAL SIGNS: ED Triage Vitals    Enc Vitals Group     BP 10/06/18 1323 (!) 133/58     Pulse Rate 10/06/18 1323 79     Resp 10/06/18 1323 16     Temp 10/06/18 1323 97.8 F (36.6 C)     Temp Source 10/06/18 1323 Oral     SpO2 10/06/18 1323 99 %     Weight 10/06/18 1324 63.5 kg (140 lb)     Height 10/06/18 1324 1.829 m (6')     Head Circumference --      Peak Flow --      Pain Score 10/06/18 1324 0     Pain Loc --      Pain Edu? --      Excl. in [place]? --     Constitutional: Alert, unwilling to answer questions Eyes: Conjunctivae are normal.  Head: Atraumatic. Nose: No congestion/rhinnorhea. Mouth/Throat: Mucous membranes are moist.    Cardiovascular: Normal rate, regular rhythm. Grossly normal heart sounds.  Good peripheral circulation. Respiratory: Normal respiratory effort.  No retractions. Lungs CTAB. Gastrointestinal: Soft and nontender. No distention.  No CVA tenderness.  Musculoskeletal:  Warm and well perfused Neurologic:   No gross focal neurologic deficits are appreciated.  Skin:  Skin is warm, dry and intact. No rash noted.   ____________________________________________   LABS (all labs ordered are listed, but only abnormal results are displayed)  Labs Reviewed  COMPREHENSIVE METABOLIC PANEL - Abnormal; Notable for the  following components:      Result Value   Sodium 146 (*)    All other components within normal limits  ETHANOL - Abnormal; Notable for the following components:   Alcohol, Ethyl (B) 228 (*)    All other components within normal limits  CBC - Abnormal; Notable for the following components:   RBC 3.42 (*)    Hemoglobin 11.4 (*)    HCT 35.8 (*)    MCV 104.7 (*)    All other components within normal limits  URINE DRUG SCREEN, QUALITATIVE ([facility] ONLY)   ____________________________________________  EKG   ____________________________________________  RADIOLOGY  None ____________________________________________   PROCEDURES  Procedure(s) performed:  No  Procedures   Critical Care performed: No ____________________________________________   INITIAL IMPRESSION / ASSESSMENT AND PLAN / ED COURSE  Pertinent labs & imaging results that were available during my care of the patient were reviewed by me and considered in my medical decision making (see chart for details).  Patient presents under IVC for reported EtOH abuse.  No further history available at this time.  Patient unwilling or unable to provide significant history.  Lab work is significant for significantly elevated EtOH level.  Will consult TTS and psychiatry for further evaluation    ____________________________________________   FINAL CLINICAL IMPRESSION(S) / ED DIAGNOSES  Final diagnoses:  Alcohol abuse        Note:  This document was prepared using Dragon voice recognition software and may include unintentional dictation errors.    Lavonia Drafts, MD 10/06/18 [phone]

## 2018-10-07 DIAGNOSIS — F1010 Alcohol abuse, uncomplicated: Secondary | ICD-10-CM | POA: Diagnosis not present

## 2018-10-07 LAB — URINE DRUG SCREEN, QUALITATIVE (ARMC ONLY)
Amphetamines, Ur Screen: NOT DETECTED
Barbiturates, Ur Screen: NOT DETECTED
Benzodiazepine, Ur Scrn: NOT DETECTED
Cannabinoid 50 Ng, Ur ~~LOC~~: NOT DETECTED
Cocaine Metabolite,Ur ~~LOC~~: NOT DETECTED
MDMA (Ecstasy)Ur Screen: NOT DETECTED
Methadone Scn, Ur: NOT DETECTED
Opiate, Ur Screen: NOT DETECTED
Phencyclidine (PCP) Ur S: NOT DETECTED
Tricyclic, Ur Screen: NOT DETECTED

## 2018-10-07 LAB — PHENYTOIN LEVEL, TOTAL: Phenytoin Lvl: <2.5 ug/mL — ABNORMAL LOW (ref 10.0–20.0)

## 2018-10-07 NOTE — ED Notes (Signed)
Pt's brother, Alvino Lechuga, called asking for update on pt. Keith Roberson he was recently made guardian. Keith Roberson judge told them to "stay out of it as much as possible" because of his drinking problem. This Probation officer urged the brother to provide [facility] with copy of legal guardianship papers.

## 2018-10-07 NOTE — ED Notes (Signed)
Pt's brother, Keith Roberson, called again to inquire if the psychiatrist had rounded and asked to speak with MD. He said family wanted to be notified it pt was placed. Keith Roberson was reminded of the importance of providing guardianship papers. He may be reached at [phone].

## 2018-10-07 NOTE — ED Notes (Signed)
Pt's wallet was not documented in chart. Security is now retrieving.

## 2018-10-07 NOTE — ED Notes (Signed)
Hourly rounding reveals patient sleeping in room. No complaints, stable, in no acute distress. Q15 minute rounds and monitoring via Security Cameras to continue. 

## 2018-10-07 NOTE — Consult Note (Signed)
[facility] Psychiatry Consult   Reason for Consult: Follow-up for this 67 year old man came into the hospital last night intoxicated Referring Physician:  Darl Householder Patient Identification: JAKYLAN RON MRN:  [id number] Principal Diagnosis: Alcohol abuse Diagnosis:  Principal Problem:   Alcohol abuse Active Problems:   Alcohol intoxication ([place])   Dementia ([place])   Seizures ([place])   Total Time spent with patient: 20 minutes  Subjective:   Yazid H Ohlrich is a 67 y.o. male patient admitted with "I am feeling okay".  HPI: Patient seen for follow-up.  Patient is in bed easily arousable says he is still feeling tired.  Mood is stated as fine.  Denies suicidal or homicidal thought.  Alert and oriented to place and basic situation.  No dangerous or aggressive behavior.  Continues to minimize drinking although at least now he does admit that he drinks some alcohol every day.  Patient has not had a seizure and is not showing any signs of delirium.  Past Psychiatric History: Multiple visits to the hospital for alcohol abuse  Risk to Self:   Risk to Others:   Prior Inpatient Therapy:   Prior Outpatient Therapy:    Past Medical History:  Past Medical History:  Diagnosis Date  . Dementia ([place])   . Seizures ([place])    History reviewed. No pertinent surgical history. Family History: No family history on file. Family Psychiatric  History: None known Social History:  Social History   Substance and Sexual Activity  Alcohol Use Yes  . Alcohol/week: 2.0 standard drinks  . Types: 2 Glasses of wine per week     Social History   Substance and Sexual Activity  Drug Use No    Social History   Socioeconomic History  . Marital status: Divorced    Spouse name: Not on file  . Number of children: Not on file  . Years of education: Not on file  . Highest education level: Not on file  Occupational History  . Not on file  Social Needs  . Financial resource strain: Not on file  . Food  insecurity:    Worry: Not on file    Inability: Not on file  . Transportation needs:    Medical: Not on file    Non-medical: Not on file  Tobacco Use  . Smoking status: Current Every Day Smoker    Packs/day: 0.50    Types: Cigarettes  . Smokeless tobacco: Never Used  Substance and Sexual Activity  . Alcohol use: Yes    Alcohol/week: 2.0 standard drinks    Types: 2 Glasses of wine per week  . Drug use: No  . Sexual activity: Not on file  Lifestyle  . Physical activity:    Days per week: Not on file    Minutes per session: Not on file  . Stress: Not on file  Relationships  . Social connections:    Talks on phone: Not on file    Gets together: Not on file    Attends religious service: Not on file    Active member of club or organization: Not on file    Attends meetings of clubs or organizations: Not on file    Relationship status: Not on file  Other Topics Concern  . Not on file  Social History Narrative  . Not on file   Additional Social History:    Allergies:  No Known Allergies  Labs:  Results for orders placed or performed during the hospital encounter of 10/06/18 (from the past  48 hour(s))  Comprehensive metabolic panel     Status: Abnormal   Collection Time: 10/06/18  1:34 PM  Result Value Ref Range   Sodium 146 (H) 135 - 145 mmol/L   Potassium 4.8 3.5 - 5.1 mmol/L   Chloride 111 98 - 111 mmol/L   CO2 27 22 - 32 mmol/L   Glucose, Bld 89 70 - 99 mg/dL   BUN 14 8 - 23 mg/dL   Creatinine, Ser 1.00 0.61 - 1.24 mg/dL   Calcium 9.3 8.9 - 10.3 mg/dL   Total Protein 7.7 6.5 - 8.1 g/dL   Albumin 4.2 3.5 - 5.0 g/dL   AST 17 15 - 41 U/L   ALT 8 0 - 44 U/L   Alkaline Phosphatase 69 38 - 126 U/L   Total Bilirubin 0.3 0.3 - 1.2 mg/dL   GFR calc non Af Amer >60 >60 mL/min   GFR calc Af Amer >60 >60 mL/min   Anion gap 8 5 - 15    Comment: Performed at [facility], [address] [place], [place] [address]  Ethanol     Status: Abnormal   Collection Time:  10/06/18  1:34 PM  Result Value Ref Range   Alcohol, Ethyl (B) 228 (H) <10 mg/dL    Comment: (NOTE) Lowest detectable limit for serum alcohol is 10 mg/dL. For medical purposes only. Performed at [facility], [address] [place], [place] [address]   cbc     Status: Abnormal   Collection Time: 10/06/18  1:34 PM  Result Value Ref Range   WBC 7.5 4.0 - 10.5 K/uL   RBC 3.42 (L) 4.22 - 5.81 MIL/uL   Hemoglobin 11.4 (L) 13.0 - 17.0 g/dL   HCT 35.8 (L) 39.0 - 52.0 %   MCV 104.7 (H) 80.0 - 100.0 fL   MCH 33.3 26.0 - 34.0 pg   MCHC 31.8 30.0 - 36.0 g/dL   RDW 14.1 11.5 - 15.5 %   Platelets 234 150 - 400 K/uL   nRBC 0.0 0.0 - 0.2 %    Comment: Performed at [facility], [address] [place], [place] [address]  Phenytoin level, total     Status: Abnormal   Collection Time: 10/06/18  1:34 PM  Result Value Ref Range   Phenytoin Lvl <2.5 (L) 10.0 - 20.0 ug/mL    Comment: Performed at [facility], [address], [place], [place] [address]  Urine Drug Screen, Qualitative     Status: None   Collection Time: 10/07/18 11:22 AM  Result Value Ref Range   Tricyclic, Ur Screen NONE DETECTED NONE DETECTED   Amphetamines, Ur Screen NONE DETECTED NONE DETECTED   MDMA (Ecstasy)Ur Screen NONE DETECTED NONE DETECTED   Cocaine Metabolite,Ur [place] NONE DETECTED NONE DETECTED   Opiate, Ur Screen NONE DETECTED NONE DETECTED   Phencyclidine (PCP) Ur S NONE DETECTED NONE DETECTED   Cannabinoid 50 Ng, Ur [place] NONE DETECTED NONE DETECTED   Barbiturates, Ur Screen NONE DETECTED NONE DETECTED   Benzodiazepine, Ur Scrn NONE DETECTED NONE DETECTED   Methadone Scn, Ur NONE DETECTED NONE DETECTED    Comment: (NOTE) Tricyclics + metabolites, urine    Cutoff 1000 ng/mL Amphetamines + metabolites, urine  Cutoff 1000 ng/mL MDMA (Ecstasy), urine              Cutoff 500 ng/mL Cocaine Metabolite, urine          Cutoff 300 ng/mL Opiate + metabolites, urine  Cutoff 300  ng/mL Phencyclidine (PCP), urine         Cutoff 25 ng/mL Cannabinoid, urine                 Cutoff 50 ng/mL Barbiturates + metabolites, urine  Cutoff 200 ng/mL Benzodiazepine, urine              Cutoff 200 ng/mL Methadone, urine                   Cutoff 300 ng/mL The urine drug screen provides only a preliminary, unconfirmed analytical test result and should not be used for non-medical purposes. Clinical consideration and professional judgment should be applied to any positive drug screen result due to possible interfering substances. A more specific alternate chemical method must be used in order to obtain a confirmed analytical result. Gas chromatography / mass spectrometry (GC/MS) is the preferred confirmat ory method. Performed at [facility], [address], [place], [place] [address]     Current Facility-Administered Medications  Medication Dose Route Frequency Provider Last Rate Last Dose  . phenytoin (DILANTIN) ER capsule 200 mg  200 mg Oral QHS Melanny Wire T, MD   200 mg at 10/06/18 2140   Current Outpatient Medications  Medication Sig Dispense Refill  . famotidine (PEPCID) 20 MG tablet Take 1 tablet (20 mg total) by mouth 2 (two) times daily. (Patient not taking: Reported on 10/07/2018) 60 tablet 0  . ondansetron (ZOFRAN ODT) 4 MG disintegrating tablet Take 1 tablet (4 mg total) by mouth every 8 (eight) hours as needed for nausea or vomiting. (Patient not taking: Reported on 10/07/2018) 20 tablet 0  . ondansetron (ZOFRAN) 8 MG tablet Take 8 mg by mouth every 8 (eight) hours as needed for nausea or vomiting.    . ranitidine (ZANTAC) 150 MG tablet Take 1 tablet by mouth 2 (two) times daily.  2    Musculoskeletal: Strength & Muscle Tone: within normal limits Gait & Station: normal Patient leans: N/A  Psychiatric Specialty Exam: Physical Exam  Nursing note and vitals reviewed. Constitutional: He appears well-developed and well-nourished.  HENT:  Head:  Normocephalic and atraumatic.  Eyes: Pupils are equal, round, and reactive to light. Conjunctivae are normal.  Neck: Normal range of motion.  Cardiovascular: Regular rhythm and normal heart sounds.  Respiratory: Effort normal. No respiratory distress.  GI: Soft.  Musculoskeletal: Normal range of motion.  Neurological: He is alert.  Skin: Skin is warm and dry.  Psychiatric: His affect is blunt. His speech is delayed. He is slowed. Cognition and memory are impaired. He expresses impulsivity. He expresses no homicidal and no suicidal ideation.    Review of Systems  Constitutional: Negative.   HENT: Negative.   Eyes: Negative.   Respiratory: Negative.   Cardiovascular: Negative.   Gastrointestinal: Negative.   Musculoskeletal: Negative.   Skin: Negative.   Neurological: Negative.   Psychiatric/Behavioral: Negative.     Blood pressure (!) 158/87, pulse 99, temperature 98.3 F (36.8 C), temperature source Oral, resp. rate 16, height 6' (1.829 m), weight 63.5 kg, SpO2 100 %.Body mass index is 18.99 kg/m.  General Appearance: Disheveled  Eye Contact:  Minimal  Speech:  Slow  Volume:  Decreased  Mood:  Euthymic  Affect:  Constricted  Thought Process:  Goal Directed  Orientation:  Full (Time, Place, and Person)  Thought Content:  Logical  Suicidal Thoughts:  No  Homicidal Thoughts:  No  Memory:  Immediate;   Fair Recent;   Fair Remote;  Fair  Judgement:  Impaired  Insight:  Shallow  Psychomotor Activity:  Decreased  Concentration:  Concentration: Fair  Recall:  [employer] of Knowledge:  Fair  Language:  Fair  Akathisia:  No  Handed:  Right  AIMS (if indicated):     Assets:  Housing Social Support  ADL's:  Impaired  Cognition:  Impaired,  Mild  Sleep:        Treatment Plan Summary: Daily contact with patient to assess and evaluate symptoms and progress in treatment, Medication management and Plan Has sobered up.  No violence no dangerousness no suicidality no  threatening behavior.  Patient does not have a psychiatric illness that would require inpatient treatment.  Case will be reviewed with emergency room doctor and TTS.  IVC to be discontinued patient can be discharged home.  Brother has called and claims that they now have legal guardianship.  That remains to be seen by the paperwork but it would not change the plan to discharge the patient with outpatient follow-up substance abuse treatment  Disposition: No evidence of imminent risk to self or others at present.   Patient does not meet criteria for psychiatric inpatient admission. Supportive therapy provided about ongoing stressors.  Alethia Berthold, MD 10/07/2018 1:43 PM

## 2018-10-07 NOTE — ED Provider Notes (Signed)
  Physical Exam  BP (!) 158/87 (BP Location: Left Arm)   Pulse 99   Temp 98.3 F (36.8 C) (Oral)   Resp 16   Ht 6' (1.829 m)   Wt 63.5 kg   SpO2 100%   BMI 18.99 kg/m   Physical Exam  ED Course/Procedures     Procedures  MDM  Dr. Weber Cooks saw patient and recommend discharge home. Stable for discharge.      Drenda Freeze, MD 10/07/18 1440

## 2018-10-07 NOTE — Discharge Instructions (Signed)
Stop drinking alcohol   See your doctor  Return to ER if you have thoughts of harming yourself or others, hallucinations.

## 2018-10-07 NOTE — ED Provider Notes (Signed)
-----------------------------------------   7:24 AM on 10/07/2018 -----------------------------------------   Blood pressure 124/68, pulse 91, temperature 98.6 F (37 C), temperature source Oral, resp. rate 18, height 6' (1.829 m), weight 63.5 kg, SpO2 94 %.  The patient had no acute events since last update.  Calm and cooperative at this time.  Disposition is pending Psychiatry/Behavioral Medicine team recommendations.     Paulette Blanch, MD 10/07/18 [phone]

## 2018-10-07 NOTE — ED Notes (Signed)
Pt was discharged in care of sister and brother with AVS and belongings. Pt signed for wallet. Discussed discharge with family and pt, who was ambulatory, in no acute distress, and agreeable to discharge.

## 2018-10-07 NOTE — ED Notes (Signed)
Left HIPAA-compliant message for pt's sister. Spoke w/ pt's brother, who said that law enforcement should bring the patient home since law enforcement brought him in. Will follow up.

## 2018-10-07 NOTE — ED Notes (Signed)
Pt was reminded of need for urine specimen.

## 2019-02-22 ENCOUNTER — Encounter (HOSPITAL_COMMUNITY): Payer: Self-pay

## 2019-02-22 ENCOUNTER — Other Ambulatory Visit: Payer: Self-pay

## 2019-02-22 ENCOUNTER — Emergency Department (HOSPITAL_COMMUNITY): Payer: Medicare HMO

## 2019-02-22 ENCOUNTER — Emergency Department (HOSPITAL_COMMUNITY)
Admission: EM | Admit: 2019-02-22 | Discharge: 2019-02-22 | Disposition: A | Discharge status: Home / Self Care | Payer: Medicare HMO | Attending: Emergency Medicine | Admitting: Emergency Medicine

## 2019-02-22 DIAGNOSIS — R531 Weakness: Secondary | ICD-10-CM | POA: Diagnosis present

## 2019-02-22 DIAGNOSIS — Z85118 Personal history of other malignant neoplasm of bronchus and lung: Secondary | ICD-10-CM | POA: Diagnosis not present

## 2019-02-22 DIAGNOSIS — R0602 Shortness of breath: Secondary | ICD-10-CM | POA: Insufficient documentation

## 2019-02-22 DIAGNOSIS — Z20828 Contact with and (suspected) exposure to other viral communicable diseases: Secondary | ICD-10-CM | POA: Diagnosis not present

## 2019-02-22 DIAGNOSIS — R5381 Other malaise: Secondary | ICD-10-CM | POA: Diagnosis not present

## 2019-02-22 DIAGNOSIS — F17210 Nicotine dependence, cigarettes, uncomplicated: Secondary | ICD-10-CM | POA: Diagnosis not present

## 2019-02-22 DIAGNOSIS — F0390 Unspecified dementia without behavioral disturbance: Secondary | ICD-10-CM | POA: Diagnosis not present

## 2019-02-22 DIAGNOSIS — R110 Nausea: Secondary | ICD-10-CM | POA: Diagnosis not present

## 2019-02-22 HISTORY — DX: Malignant (primary) neoplasm, unspecified: C80.1

## 2019-02-22 LAB — COMPREHENSIVE METABOLIC PANEL
ALT: 16 U/L (ref 0–44)
AST: 40 U/L (ref 15–41)
Albumin: 4.3 g/dL (ref 3.5–5.0)
Alkaline Phosphatase: 126 U/L (ref 38–126)
Anion gap: 17 — ABNORMAL HIGH (ref 5–15)
BUN: 18 mg/dL (ref 8–23)
CO2: 22 mmol/L (ref 22–32)
Calcium: 9 mg/dL (ref 8.9–10.3)
Chloride: 104 mmol/L (ref 98–111)
Creatinine, Ser: 1.25 mg/dL — ABNORMAL HIGH (ref 0.61–1.24)
GFR calc Af Amer: >60 mL/min (ref >60)
GFR calc non Af Amer: 58 mL/min — ABNORMAL LOW (ref >60)
Glucose, Bld: 93 mg/dL (ref 70–99)
Potassium: 4.7 mmol/L (ref 3.5–5.1)
Sodium: 143 mmol/L (ref 135–145)
Total Bilirubin: 2.1 mg/dL — ABNORMAL HIGH (ref 0.3–1.2)
Total Protein: 8.1 g/dL (ref 6.5–8.1)

## 2019-02-22 LAB — ETHANOL: Alcohol, Ethyl (B): <10 mg/dL (ref <10)

## 2019-02-22 LAB — URINALYSIS, ROUTINE W REFLEX MICROSCOPIC
Bacteria, UA: NONE SEEN
Bilirubin Urine: NEGATIVE
Glucose, UA: NEGATIVE mg/dL
Ketones, ur: 20 mg/dL — AB
Leukocytes,Ua: NEGATIVE
Nitrite: NEGATIVE
Protein, ur: NEGATIVE mg/dL
Specific Gravity, Urine: 1.017 (ref 1.005–1.030)
pH: 7 (ref 5.0–8.0)

## 2019-02-22 LAB — RAPID URINE DRUG SCREEN, HOSP PERFORMED
Amphetamines: NOT DETECTED
Barbiturates: NOT DETECTED
Benzodiazepines: NOT DETECTED
Cocaine: NOT DETECTED
Opiates: NOT DETECTED
Tetrahydrocannabinol: NOT DETECTED

## 2019-02-22 LAB — CBC WITH DIFFERENTIAL/PLATELET
Abs Immature Granulocytes: 0.02 10*3/uL (ref 0.00–0.07)
Basophils Absolute: 0 10*3/uL (ref 0.0–0.1)
Basophils Relative: 0 %
Eosinophils Absolute: 0 10*3/uL (ref 0.0–0.5)
Eosinophils Relative: 0 %
HCT: 40.6 % (ref 39.0–52.0)
Hemoglobin: 13.7 g/dL (ref 13.0–17.0)
Immature Granulocytes: 0 %
Lymphocytes Relative: 26 %
Lymphs Abs: 2.3 10*3/uL (ref 0.7–4.0)
MCH: 33.5 pg (ref 26.0–34.0)
MCHC: 33.7 g/dL (ref 30.0–36.0)
MCV: 99.3 fL (ref 80.0–100.0)
Monocytes Absolute: 0.5 10*3/uL (ref 0.1–1.0)
Monocytes Relative: 6 %
Neutro Abs: 6.1 10*3/uL (ref 1.7–7.7)
Neutrophils Relative %: 68 %
Platelets: 218 10*3/uL (ref 150–400)
RBC: 4.09 MIL/uL — ABNORMAL LOW (ref 4.22–5.81)
RDW: 14.3 % (ref 11.5–15.5)
WBC: 8.9 10*3/uL (ref 4.0–10.5)
nRBC: 0 % (ref 0.0–0.2)

## 2019-02-22 LAB — TROPONIN I: Troponin I: <0.03 ng/mL (ref <0.03)

## 2019-02-22 LAB — SARS CORONAVIRUS 2 BY RT PCR (HOSPITAL ORDER, PERFORMED IN ~~LOC~~ HOSPITAL LAB): SARS Coronavirus 2: NEGATIVE

## 2019-02-22 MED ORDER — ONDANSETRON HCL 4 MG/2ML IJ SOLN
4.0000 mg | Freq: Once | INTRAMUSCULAR | Status: AC
  Administered 2019-02-22: 15:00:00 4 mg via INTRAVENOUS
  Filled 2019-02-22: qty 2

## 2019-02-22 MED ORDER — SODIUM CHLORIDE 0.9 % IV BOLUS
500.0000 mL | Freq: Once | INTRAVENOUS | Status: AC
  Administered 2019-02-22: 500 mL via INTRAVENOUS

## 2019-02-22 NOTE — Discharge Instructions (Addendum)
The test today did not show any serious problems.  It is important to avoid drinking alcohol.  Also make sure you are eating 3 meals a day and drinking a lot of water.  You should drink 1 to 2 L of water every day.  Return here, if needed, for problems.

## 2019-02-22 NOTE — ED Provider Notes (Signed)
[facility] EMERGENCY DEPARTMENT Provider Note   CSN: [id number] Arrival date & time: 02/22/19  1446    History   Chief Complaint Chief Complaint  Patient presents with  . Weakness    HPI Keith Roberson is a 68 y.o. male.     HPI   Patient presents by EMS for evaluation of creased oral intake and weakness.  He also complains of nausea, and shortness of breath.  Patient denies fever, cough, known sick exposure, focal weakness or paresthesia.  He denies recent contact with the medical system.  There are no other known modifying factors.  Past Medical History:  Diagnosis Date  . Cancer ([place])    lung  . Dementia ([place])   . Seizures [facility])     Patient Active Problem List   Diagnosis Date Noted  . Alcohol abuse 10/06/2018  . Alcohol intoxication ([place]) 10/06/2018  . Dementia ([place]) 10/06/2018  . Seizures ([place]) 10/06/2018  . Special screening for malignant neoplasms, colon 10/23/2016    History reviewed. No pertinent surgical history.      Home Medications    Prior to Admission medications   Not on File    Family History No family history on file.  Social History Social History   Tobacco Use  . Smoking status: Current Every Day Smoker    Packs/day: 0.50    Types: Cigarettes  . Smokeless tobacco: Never Used  Substance Use Topics  . Alcohol use: Yes    Comment: wine daily  . Drug use: No     Allergies   Patient has no known allergies.   Review of Systems Review of Systems  All other systems reviewed and are negative.    Physical Exam Updated Vital Signs BP (!) 155/93   Pulse 90   Temp 98.6 F (37 C) (Oral)   Resp 20   Ht 5\' 11"  (1.803 m)   SpO2 100%   BMI 19.53 kg/m   Physical Exam Vitals signs and nursing note reviewed.  Constitutional:      General: He is not in acute distress.    Appearance: He is well-developed. He is ill-appearing. He is not toxic-appearing or diaphoretic.     Comments: Under nourished, appears older than stated  age.  HENT:     Head: Normocephalic and atraumatic.     Right Ear: External ear normal.     Left Ear: External ear normal.     Nose: Nose normal. No congestion or rhinorrhea.     Mouth/Throat:     Mouth: Mucous membranes are moist.     Pharynx: No oropharyngeal exudate or posterior oropharyngeal erythema.  Eyes:     Conjunctiva/sclera: Conjunctivae normal.     Pupils: Pupils are equal, round, and reactive to light.  Neck:     Musculoskeletal: Normal range of motion and neck supple.     Trachea: Phonation normal.  Cardiovascular:     Heart sounds: Normal heart sounds.     Comments: Initially had a regular tachycardia, when he was seen shortly after arrival (14:46) Pulmonary:     Effort: Pulmonary effort is normal. No respiratory distress.     Breath sounds: Normal breath sounds. No stridor. No rhonchi.  Abdominal:     General: There is no distension.     Palpations: Abdomen is soft.     Tenderness: There is no abdominal tenderness.  Musculoskeletal: Normal range of motion.     Right lower leg: No edema.  Skin:    General: Skin  is warm and dry.     Coloration: Skin is not jaundiced or pale.  Neurological:     Mental Status: He is alert and oriented to person, place, and time.     Cranial Nerves: No cranial nerve deficit.     Sensory: No sensory deficit.     Motor: No abnormal muscle tone.     Coordination: Coordination normal.  Psychiatric:        Mood and Affect: Mood normal.        Behavior: Behavior normal.      ED Treatments / Results  Labs (all labs ordered are listed, but only abnormal results are displayed) Labs Reviewed  COMPREHENSIVE METABOLIC PANEL - Abnormal; Notable for the following components:      Result Value   Creatinine, Ser 1.25 (*)    Total Bilirubin 2.1 (*)    GFR calc non Af Amer 58 (*)    Anion gap 17 (*)    All other components within normal limits  CBC WITH DIFFERENTIAL/PLATELET - Abnormal; Notable for the following components:   RBC 4.09  (*)    All other components within normal limits  URINALYSIS, ROUTINE W REFLEX MICROSCOPIC - Abnormal; Notable for the following components:   Hgb urine dipstick SMALL (*)    Ketones, ur 20 (*)    All other components within normal limits  SARS CORONAVIRUS 2 (HOSPITAL ORDER, [place] [facility])  TROPONIN I  RAPID URINE DRUG SCREEN, HOSP PERFORMED  ETHANOL    EKG EKG Interpretation  Date/Time:  Monday Feb 22 2019 14:50:31 EDT Ventricular Rate:  149 PR Interval:    QRS Duration: 80 QT Interval:  271 QTC Calculation: 427 R Axis:   76 Text Interpretation:  Sinus tachycardia Minimal ST depression, inferior leads Artifact in lead(s) I II III aVR aVL V1 V2 Since last tracing rate faster Confirmed by Daleen Bo [phone]) on 02/22/2019 4:24:26 PM    EKG Interpretation  Date/Time:  Monday Feb 22 2019 14:53:44 EDT Ventricular Rate:  102 PR Interval:    QRS Duration: 74 QT Interval:  324 QTC Calculation: 422 R Axis:   80 Text Interpretation:  Sinus tachycardia Since last tracing of earlier today heart rate has slowed Confirmed by Daleen Bo [phone]) on 02/22/2019 4:25:35 PM        Radiology Dg Chest Port 1 View  Result Date: 02/22/2019 CLINICAL DATA:  Tachycardia.  Weakness. EXAM: PORTABLE CHEST 1 VIEW COMPARISON:  Chest x-ray dated August 11, 2018. FINDINGS: The heart size and mediastinal contours are within normal limits. Normal pulmonary vascularity. The lungs remain hyperinflated with emphysematous changes and mildly coarsened interstitial markings. No focal consolidation, pleural effusion, or pneumothorax. No acute osseous abnormality. IMPRESSION: 1.  No active cardiopulmonary disease. 2. COPD. Electronically Signed   By: Titus Dubin M.D.   On: 02/22/2019 15:10    Procedures Procedures (including critical care time)  Medications Ordered in ED Medications  sodium chloride 0.9 % bolus 500 mL (0 mLs Intravenous Stopped 02/22/19 1633)  ondansetron  (ZOFRAN) injection 4 mg (4 mg Intravenous Given 02/22/19 1517)     Initial Impression / Assessment and Plan / ED Course  I have reviewed the triage vital signs and the nursing notes.  Pertinent labs & imaging results that were available during my care of the patient were reviewed by me and considered in my medical decision making (see chart for details).  Clinical Course as of Feb 21 1900  Mon Feb 22, 2019  1657 Normal  Ethanol [EW]  1657 Normal  CBC with Differential(!) [EW]  1657 Normal  Troponin I - Once [EW]  1657 Normal except creatinine slightly high  Comprehensive metabolic panel(!) [EW]  3419 Normal  Urine rapid drug screen (hosp performed) [EW]  1658 Normal except elevated hemoglobin, ketones  Urinalysis, Routine w reflex microscopic(!) [EW]  1658 No infiltrate or CHF, image reviewed by me  DG Chest Port 1 View [EW]    Clinical Course User Index [EW] Daleen Bo, MD        Patient Vitals for the past 24 hrs:  BP Temp Temp src Pulse Resp SpO2 Height  02/22/19 1630 (!) 155/93 - - 90 20 100 % -  02/22/19 1530 (!) 139/107 - - 90 (!) 25 90 % -  02/22/19 1500 (!) 157/95 - - 99 (!) 23 100 % -  02/22/19 1453 (!) 143/96 98.6 F (37 C) Oral (!) 104 (!) 28 100 % -  02/22/19 1449 - - - - - - 5\' 11"  (1.803 m)    6:59 PM Reevaluation with update and discussion. After initial assessment and treatment, an updated evaluation reveals he is alert and appears comfortable.  He is not  vomiting.  He has been able to drink water.  He has been offered food, but declines it at this time.  He states "I will eat later."  As discussed with the patient and all questions were answered. Daleen Bo   Medical Decision Making: Nonspecific malaise, with nausea.  Patient is a heavy drinker, not currently intoxicated.  There is no signs of serious bacterial infection, metabolic instability or suggestion for impending vascular collapse.  There is no indication for hospitalization or other  intervention at this time.  CRITICAL CARE-no Performed by: Daleen Bo   Nursing Notes Reviewed/ Care Coordinated Applicable Imaging Reviewed Interpretation of Laboratory Data incorporated into ED treatment  The patient appears reasonably screened and/or stabilized for discharge and I doubt any other medical condition or other [facility] requiring further screening, evaluation, or treatment in the ED at this time prior to discharge.  Plan: Home Medications-OTC as needed; Home Treatments-avoid alcohol, rest, fluids; return here if the recommended treatment, does not improve the symptoms; Recommended follow up-PCP of choice for checkup in 1 week.     Final Clinical Impressions(s) / ED Diagnoses   Final diagnoses:  [facility]    ED Discharge Orders    None       Daleen Bo, MD 02/22/19 1901

## 2019-02-22 NOTE — ED Triage Notes (Addendum)
Pt brought in by EMS due to 3 days of not eating and weakness. Original call was for SOB HR up to 150-160 CBG 107. EMS adm 500 bolus of fluids and zofran 4 mg IV

## 2019-07-26 ENCOUNTER — Other Ambulatory Visit: Payer: Self-pay

## 2019-07-26 ENCOUNTER — Emergency Department (HOSPITAL_COMMUNITY): Payer: Medicare HMO

## 2019-07-26 ENCOUNTER — Encounter (HOSPITAL_COMMUNITY): Payer: Self-pay

## 2019-07-26 ENCOUNTER — Emergency Department (HOSPITAL_COMMUNITY)
Admission: EM | Admit: 2019-07-26 | Discharge: 2019-07-26 | Disposition: A | Discharge status: Home / Self Care | Payer: Medicare HMO | Attending: Emergency Medicine | Admitting: Emergency Medicine

## 2019-07-26 DIAGNOSIS — R0602 Shortness of breath: Secondary | ICD-10-CM | POA: Diagnosis present

## 2019-07-26 DIAGNOSIS — Z5321 Procedure and treatment not carried out due to patient leaving prior to being seen by health care provider: Secondary | ICD-10-CM | POA: Insufficient documentation

## 2019-07-26 LAB — CBC WITH DIFFERENTIAL/PLATELET
Abs Immature Granulocytes: 0.03 10*3/uL (ref 0.00–0.07)
Basophils Absolute: 0 10*3/uL (ref 0.0–0.1)
Basophils Relative: 0 %
Eosinophils Absolute: 0 10*3/uL (ref 0.0–0.5)
Eosinophils Relative: 0 %
HCT: 41.1 % (ref 39.0–52.0)
Hemoglobin: 13.1 g/dL (ref 13.0–17.0)
Immature Granulocytes: 0 %
Lymphocytes Relative: 17 %
Lymphs Abs: 1.9 10*3/uL (ref 0.7–4.0)
MCH: 33.2 pg (ref 26.0–34.0)
MCHC: 31.9 g/dL (ref 30.0–36.0)
MCV: 104.1 fL — ABNORMAL HIGH (ref 80.0–100.0)
Monocytes Absolute: 0.5 10*3/uL (ref 0.1–1.0)
Monocytes Relative: 5 %
Neutro Abs: 8.6 10*3/uL — ABNORMAL HIGH (ref 1.7–7.7)
Neutrophils Relative %: 78 %
Platelets: 223 10*3/uL (ref 150–400)
RBC: 3.95 MIL/uL — ABNORMAL LOW (ref 4.22–5.81)
RDW: 13.7 % (ref 11.5–15.5)
WBC: 11 10*3/uL — ABNORMAL HIGH (ref 4.0–10.5)
nRBC: 0 % (ref 0.0–0.2)

## 2019-07-26 LAB — COMPREHENSIVE METABOLIC PANEL WITH GFR
ALT: 22 U/L (ref 0–44)
AST: 38 U/L (ref 15–41)
Albumin: 4.3 g/dL (ref 3.5–5.0)
Alkaline Phosphatase: 95 U/L (ref 38–126)
Anion gap: 16 — ABNORMAL HIGH (ref 5–15)
BUN: 20 mg/dL (ref 8–23)
CO2: 24 mmol/L (ref 22–32)
Calcium: 9.2 mg/dL (ref 8.9–10.3)
Chloride: 104 mmol/L (ref 98–111)
Creatinine, Ser: 1.21 mg/dL (ref 0.61–1.24)
GFR calc Af Amer: >60 mL/min
GFR calc non Af Amer: >60 mL/min
Glucose, Bld: 111 mg/dL — ABNORMAL HIGH (ref 70–99)
Potassium: 4.3 mmol/L (ref 3.5–5.1)
Sodium: 144 mmol/L (ref 135–145)
Total Bilirubin: 1.7 mg/dL — ABNORMAL HIGH (ref 0.3–1.2)
Total Protein: 7.8 g/dL (ref 6.5–8.1)

## 2019-07-26 NOTE — ED Triage Notes (Addendum)
Pt brought to ED by [facility] for SOB. Pt with history of throat CA. O2 sat per EMS 97% on RA. Pt c/o SOB which started this am.

## 2019-07-27 ENCOUNTER — Other Ambulatory Visit: Payer: Self-pay

## 2019-07-27 ENCOUNTER — Encounter (HOSPITAL_COMMUNITY): Payer: Self-pay | Admitting: Emergency Medicine

## 2019-07-27 ENCOUNTER — Emergency Department (HOSPITAL_COMMUNITY): Payer: Medicare HMO

## 2019-07-27 ENCOUNTER — Emergency Department (HOSPITAL_COMMUNITY)
Admission: EM | Admit: 2019-07-27 | Discharge: 2019-07-27 | Disposition: A | Discharge status: Home / Self Care | Payer: Medicare HMO | Attending: Emergency Medicine | Admitting: Emergency Medicine

## 2019-07-27 DIAGNOSIS — R0602 Shortness of breath: Secondary | ICD-10-CM | POA: Diagnosis present

## 2019-07-27 DIAGNOSIS — F0391 Unspecified dementia with behavioral disturbance: Secondary | ICD-10-CM | POA: Insufficient documentation

## 2019-07-27 DIAGNOSIS — Z85118 Personal history of other malignant neoplasm of bronchus and lung: Secondary | ICD-10-CM | POA: Insufficient documentation

## 2019-07-27 DIAGNOSIS — F17210 Nicotine dependence, cigarettes, uncomplicated: Secondary | ICD-10-CM | POA: Diagnosis not present

## 2019-07-27 LAB — COMPREHENSIVE METABOLIC PANEL
ALT: 17 U/L (ref 0–44)
AST: 24 U/L (ref 15–41)
Albumin: 4 g/dL (ref 3.5–5.0)
Alkaline Phosphatase: 86 U/L (ref 38–126)
Anion gap: 12 (ref 5–15)
BUN: 18 mg/dL (ref 8–23)
CO2: 24 mmol/L (ref 22–32)
Calcium: 9 mg/dL (ref 8.9–10.3)
Chloride: 102 mmol/L (ref 98–111)
Creatinine, Ser: 1.11 mg/dL (ref 0.61–1.24)
GFR calc Af Amer: >60 mL/min (ref >60)
GFR calc non Af Amer: >60 mL/min (ref >60)
Glucose, Bld: 101 mg/dL — ABNORMAL HIGH (ref 70–99)
Potassium: 4.1 mmol/L (ref 3.5–5.1)
Sodium: 138 mmol/L (ref 135–145)
Total Bilirubin: 1.5 mg/dL — ABNORMAL HIGH (ref 0.3–1.2)
Total Protein: 7.5 g/dL (ref 6.5–8.1)

## 2019-07-27 LAB — URINALYSIS, ROUTINE W REFLEX MICROSCOPIC
Bacteria, UA: NONE SEEN
Bilirubin Urine: NEGATIVE
Glucose, UA: NEGATIVE mg/dL
Hgb urine dipstick: NEGATIVE
Ketones, ur: 20 mg/dL — AB
Leukocytes,Ua: NEGATIVE
Nitrite: NEGATIVE
Protein, ur: 30 mg/dL — AB
Specific Gravity, Urine: 1.025 (ref 1.005–1.030)
pH: 8 (ref 5.0–8.0)

## 2019-07-27 LAB — D-DIMER, QUANTITATIVE: D-Dimer, Quant: 0.75 ug/mL-FEU — ABNORMAL HIGH (ref 0.00–0.50)

## 2019-07-27 LAB — BRAIN NATRIURETIC PEPTIDE: B Natriuretic Peptide: 44 pg/mL (ref 0.0–100.0)

## 2019-07-27 LAB — CBC WITH DIFFERENTIAL/PLATELET
Abs Immature Granulocytes: 0.01 10*3/uL (ref 0.00–0.07)
Basophils Absolute: 0 10*3/uL (ref 0.0–0.1)
Basophils Relative: 0 %
Eosinophils Absolute: 0 10*3/uL (ref 0.0–0.5)
Eosinophils Relative: 0 %
HCT: 38.5 % — ABNORMAL LOW (ref 39.0–52.0)
Hemoglobin: 12.7 g/dL — ABNORMAL LOW (ref 13.0–17.0)
Immature Granulocytes: 0 %
Lymphocytes Relative: 33 %
Lymphs Abs: 2.6 10*3/uL (ref 0.7–4.0)
MCH: 33.6 pg (ref 26.0–34.0)
MCHC: 33 g/dL (ref 30.0–36.0)
MCV: 101.9 fL — ABNORMAL HIGH (ref 80.0–100.0)
Monocytes Absolute: 0.5 10*3/uL (ref 0.1–1.0)
Monocytes Relative: 6 %
Neutro Abs: 4.7 10*3/uL (ref 1.7–7.7)
Neutrophils Relative %: 61 %
Platelets: 203 10*3/uL (ref 150–400)
RBC: 3.78 MIL/uL — ABNORMAL LOW (ref 4.22–5.81)
RDW: 13.4 % (ref 11.5–15.5)
WBC: 7.8 10*3/uL (ref 4.0–10.5)
nRBC: 0 % (ref 0.0–0.2)

## 2019-07-27 LAB — TROPONIN I (HIGH SENSITIVITY): Troponin I (High Sensitivity): 5 ng/L (ref <18)

## 2019-07-27 MED ORDER — IOHEXOL 350 MG/ML SOLN
100.0000 mL | Freq: Once | INTRAVENOUS | Status: AC | PRN
  Administered 2019-07-27: 100 mL via INTRAVENOUS

## 2019-07-27 MED ORDER — ALBUTEROL SULFATE HFA 108 (90 BASE) MCG/ACT IN AERS
2.0000 | INHALATION_SPRAY | RESPIRATORY_TRACT | Status: DC | PRN
  Administered 2019-07-27: 2 via RESPIRATORY_TRACT
  Filled 2019-07-27: qty 6.7

## 2019-07-27 MED ORDER — PREDNISONE 50 MG PO TABS: 50.0000 mg | ORAL_TABLET | Freq: Every day | ORAL | 0 refills | Status: DC

## 2019-07-27 MED ORDER — AEROCHAMBER PLUS FLO-VU MEDIUM MISC
1.0000 | Freq: Once | Status: AC
  Administered 2019-07-27: 1
  Filled 2019-07-27 (×2): qty 1

## 2019-07-27 NOTE — ED Triage Notes (Signed)
Pt c/o of sob and productive cough x 2 days.

## 2019-07-27 NOTE — ED Notes (Signed)
Spoke with Evette Georges, informed that pt will be getting CT scan.

## 2019-07-27 NOTE — ED Provider Notes (Signed)
[facility] EMERGENCY DEPARTMENT Provider Note   CSN: [id number] Arrival date & time: 07/27/19  1751     History   Chief Complaint Chief Complaint  Patient presents with   Shortness of Breath    HPI Keith Roberson is a 68 y.o. male.     HPI Patient presents to the emergency department with an episode of shortness of breath that occurred at nearly this morning.  The patient states that he had an episode of shortness of breath and was here last night but did not stay.  Patient states that nothing seemed to make the condition better or worse.  The patient is a pretty poor historian and cannot give me many details surrounding this incident.  Patient states that he is unsure if he had any other associated symptoms.  The patient denies chest pain,headache,blurred vision, neck pain, fever, cough, weakness, numbness, dizziness, anorexia, edema, abdominal pain, nausea, vomiting, diarrhea, rash, back pain, dysuria, hematemesis, bloody stool, near syncope, or syncope. Past Medical History:  Diagnosis Date   Cancer ([place])    lung   Dementia ([place])    Seizures ([place])     Patient Active Problem List   Diagnosis Date Noted   Alcohol abuse 10/06/2018   Alcohol intoxication ([place]) 10/06/2018   Dementia ([place]) 10/06/2018   Seizures ([place]) 10/06/2018   Special screening for malignant neoplasms, colon 10/23/2016    History reviewed. No pertinent surgical history.      Home Medications    Prior to Admission medications   Not on File    Family History History reviewed. No pertinent family history.  Social History Social History   Tobacco Use   Smoking status: Current Every Day Smoker    Packs/day: 0.50    Types: Cigarettes   Smokeless tobacco: Never Used  Substance Use Topics   Alcohol use: Yes    Comment: wine daily   Drug use: No     Allergies   Patient has no known allergies.   Review of Systems Review of Systems All other systems negative except as  documented in the HPI. All pertinent positives and negatives as reviewed in the HPI.   Physical Exam Updated Vital Signs BP (!) 163/94    Pulse 77    Temp 98.1 F (36.7 C) (Oral)    Resp 19    Ht 5\' 11"  (1.803 m)    Wt 73.5 kg    SpO2 99%    BMI 22.59 kg/m   Physical Exam Vitals signs and nursing note reviewed.  Constitutional:      General: He is not in acute distress.    Appearance: He is well-developed.  HENT:     Head: Normocephalic and atraumatic.  Eyes:     Pupils: Pupils are equal, round, and reactive to light.  Neck:     Musculoskeletal: Normal range of motion and neck supple.  Cardiovascular:     Rate and Rhythm: Normal rate and regular rhythm.     Heart sounds: Normal heart sounds. No murmur. No friction rub. No gallop.   Pulmonary:     Effort: Pulmonary effort is normal. No respiratory distress.     Breath sounds: Normal breath sounds. No decreased breath sounds, wheezing, rhonchi or rales.  Abdominal:     General: Bowel sounds are normal. There is no distension.     Palpations: Abdomen is soft.     Tenderness: There is no abdominal tenderness.  Skin:    General: Skin is warm and dry.  Capillary Refill: Capillary refill takes less than 2 seconds.     Findings: No erythema or rash.  Neurological:     Mental Status: He is alert and oriented to person, place, and time.     Motor: No abnormal muscle tone.     Coordination: Coordination normal.  Psychiatric:        Behavior: Behavior normal.      ED Treatments / Results  Labs (all labs ordered are listed, but only abnormal results are displayed) Labs Reviewed  COMPREHENSIVE METABOLIC PANEL - Abnormal; Notable for the following components:      Result Value   Glucose, Bld 101 (*)    Total Bilirubin 1.5 (*)    All other components within normal limits  CBC WITH DIFFERENTIAL/PLATELET - Abnormal; Notable for the following components:   RBC 3.78 (*)    Hemoglobin 12.7 (*)    HCT 38.5 (*)    MCV 101.9 (*)      All other components within normal limits  URINALYSIS, ROUTINE W REFLEX MICROSCOPIC - Abnormal; Notable for the following components:   Ketones, ur 20 (*)    Protein, ur 30 (*)    All other components within normal limits  D-DIMER, QUANTITATIVE (NOT AT [facility]) - Abnormal; Notable for the following components:   D-Dimer, Quant 0.75 (*)    All other components within normal limits  BRAIN NATRIURETIC PEPTIDE  TROPONIN I (HIGH SENSITIVITY)    EKG None  Radiology Dg Chest 2 View  Result Date: 07/26/2019 CLINICAL DATA:  Shortness of breath starting this morning. Smoker. EXAM: CHEST - 2 VIEW COMPARISON:  Chest x-rays dated 02/22/2019 and 08/11/2018. FINDINGS: Heart size and mediastinal contours are within normal limits. Lungs are hyperexpanded. Chronic bronchitic changes noted centrally. No confluent opacity to suggest a developing pneumonia or pulmonary edema. No suspicious nodule or mass. No pleural effusion or pneumothorax seen. Osseous structures about the chest are unremarkable. IMPRESSION: 1. No active cardiopulmonary disease. No evidence of pneumonia or pulmonary edema. 2. Hyperexpanded lungs indicating COPD. 3. Chronic bronchitic changes. Electronically Signed   By: Franki Cabot M.D.   On: 07/26/2019 14:43   Ct Angio Chest Pe W/cm &/or Wo Cm  Result Date: 07/27/2019 CLINICAL DATA:  Cough, shortness of breath and elevated D-dimer. EXAM: CT ANGIOGRAPHY CHEST WITH CONTRAST TECHNIQUE: Multidetector CT imaging of the chest was performed using the standard protocol during bolus administration of intravenous contrast. Multiplanar CT image reconstructions and MIPs were obtained to evaluate the vascular anatomy. CONTRAST:  122mL OMNIPAQUE IOHEXOL 350 MG/ML SOLN COMPARISON:  Chest x-ray 07/27/2019 FINDINGS: Cardiovascular: The heart is normal in size. No pericardial effusion. There is mild tortuosity and calcification of the thoracic aorta but no dissection. The branch vessels are patent. Coronary  artery calcifications are noted. The pulmonary arterial tree is well opacified. No filling defects to suggest pulmonary embolism. Mediastinum/Nodes: Small scattered mediastinal and hilar lymph nodes likely related to the patient's emphysema. No mass or overt adenopathy. The esophagus is grossly normal. Lungs/Pleura: Emphysematous changes are noted along with some areas of pulmonary scarring. No infiltrates, edema or effusions. No worrisome pulmonary lesions. 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 Upper Abdomen: No significant upper abdominal findings. Moderate atherosclerotic calcifications involving the abdominal aorta. 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 Musculoskeletal: No significant bony findings. No chest wall mass, supraclavicular or axillary adenopathy. Review of the MIP images confirms the above findings. IMPRESSION: 1. No CT findings for pulmonary embolism. 2. Atherosclerotic calcifications  involving the thoracic aorta but no aneurysm or dissection. 3. Scattered coronary artery calcifications. 4. Emphysematous changes and pulmonary scarring and suspected basilar interstitial disease but no acute pulmonary findings or worrisome pulmonary lesions. Aortic Atherosclerosis (ICD10-I70.0) and Emphysema (ICD10-J43.9). Electronically Signed   By: Marijo Sanes M.D.   On: 07/27/2019 14:28   Dg Chest Port 1 View  Result Date: 07/27/2019 CLINICAL DATA:  Productive cough and shortness of breath for 2 days. EXAM: PORTABLE CHEST 1 VIEW COMPARISON:  07/26/2019 FINDINGS: The cardiac silhouette, mediastinal and hilar contours are within normal limits and stable. Stable emphysematous changes and biapical pulmonary scarring. No infiltrates or effusions. No worrisome pulmonary lesions. The bony thorax is intact. IMPRESSION: Chronic emphysematous and pulmonary scarring changes but no acute overlying pulmonary process. Electronically Signed   By: Marijo Sanes M.D.   On: 07/27/2019 08:55    Procedures Procedures (including critical care time)  Medications Ordered in ED Medications  iohexol (OMNIPAQUE) 350 MG/ML injection 100 mL (100 mLs Intravenous Contrast Given 07/27/19 1315)     Initial Impression / Assessment and Plan / ED Course  I have reviewed the triage vital signs and the nursing notes.  Pertinent labs & imaging results that were available during my care of the patient were reviewed by me and considered in my medical decision making (see chart for details).      Patient does not have any significant findings on his testing here today.  The patient has not been any shortness of breath while here in the emergency department.  Patient states he has been feeling better and would like to be discharged home.  I am not exactly sure what the cause of his transient shortness of breath was other than the fact that he does most likely have emphysema based on the fact that he is still heavy smoker.  I advised patient he will need to see his primary doctor.  Told to return here as needed.  There is no signs of pulmonary embolus on the CT scan of his chest.  Patient does not having any tachypnea or hypoxia here in the emergency department.  Patient has been walking about the emergency department without difficulty or shortness of breath.  Final Clinical Impressions(s) / ED Diagnoses   Final diagnoses:  None    ED Discharge Orders    None       Dalia Heading, PA-C 07/27/19 [place], [place], DO 07/30/19 [phone]

## 2019-07-27 NOTE — Discharge Instructions (Addendum)
Return here as needed.  You will need to follow-up with your primary doctor.  If your condition worsens you need to return here for a recheck.

## 2019-08-07 ENCOUNTER — Emergency Department (HOSPITAL_COMMUNITY): Payer: Medicare HMO

## 2019-08-07 ENCOUNTER — Other Ambulatory Visit: Payer: Self-pay

## 2019-08-07 ENCOUNTER — Encounter (HOSPITAL_COMMUNITY): Payer: Self-pay

## 2019-08-07 ENCOUNTER — Emergency Department (HOSPITAL_COMMUNITY)
Admission: EM | Admit: 2019-08-07 | Discharge: 2019-08-08 | Disposition: A | Discharge status: Home / Self Care | Payer: Medicare HMO | Attending: Emergency Medicine | Admitting: Emergency Medicine

## 2019-08-07 DIAGNOSIS — F17210 Nicotine dependence, cigarettes, uncomplicated: Secondary | ICD-10-CM | POA: Diagnosis not present

## 2019-08-07 DIAGNOSIS — R0602 Shortness of breath: Secondary | ICD-10-CM | POA: Insufficient documentation

## 2019-08-07 DIAGNOSIS — F0390 Unspecified dementia without behavioral disturbance: Secondary | ICD-10-CM | POA: Diagnosis not present

## 2019-08-07 NOTE — Discharge Instructions (Signed)
Chest x-ray here today without any acute findings.  Oxygen levels very good.  Okay for discharge home.  Follow-up with your doctor.  Return for any new or worse symptoms.  Also the CT scan that was done a few days ago also showed no acute abnormalities in the lungs.

## 2019-08-07 NOTE — ED Provider Notes (Signed)
[facility] EMERGENCY DEPARTMENT Provider Note   CSN: [id number] Arrival date & time: 08/07/19  2005     History   Chief Complaint Chief Complaint  Patient presents with  . Shortness of Breath    HPI Keith Roberson is a 68 y.o. male.     Patient somewhat of a poor historian.  Complaining of shortness of breath no evidence of any shortness of breath.  Not tachypneic oxygen saturations on room air upper 90s.  Patient evaluated for same complaint on October 6.  Had CT scan done of the chest at that time without any acute findings.  Patient claims Keith Roberson has lung cancer.  But there was no evidence of that on the CT scan.  Also no evidence of any pulmonary embolus on that scan as well.  Was CT angio.  Patient here in no acute distress no stridor no audible wheezing.     Past Medical History:  Diagnosis Date  . Cancer ([place])    lung  . Dementia ([place])   . Seizures [facility])     Patient Active Problem List   Diagnosis Date Noted  . Alcohol abuse 10/06/2018  . Alcohol intoxication ([place]) 10/06/2018  . Dementia ([place]) 10/06/2018  . Seizures ([place]) 10/06/2018  . Special screening for malignant neoplasms, colon 10/23/2016    History reviewed. No pertinent surgical history.      Home Medications    Prior to Admission medications   Medication Sig Start Date End Date Taking? Authorizing Provider  predniSONE (DELTASONE) 50 MG tablet Take 1 tablet (50 mg total) by mouth daily with breakfast. 07/27/19  Yes Lawyer, Harrell Gave, PA-C    Family History History reviewed. No pertinent family history.  Social History Social History   Tobacco Use  . Smoking status: Current Every Day Smoker    Packs/day: 0.50    Types: Cigarettes  . Smokeless tobacco: Never Used  Substance Use Topics  . Alcohol use: Yes    Comment: wine daily  . Drug use: No     Allergies   Patient has no known allergies.   Review of Systems Review of Systems  Constitutional: Negative for chills and fever.   HENT: Negative for congestion, rhinorrhea and sore throat.   Eyes: Negative for visual disturbance.  Respiratory: Positive for shortness of breath. Negative for cough.   Cardiovascular: Negative for chest pain and leg swelling.  Gastrointestinal: Negative for abdominal pain, diarrhea, nausea and vomiting.  Genitourinary: Negative for dysuria.  Musculoskeletal: Negative for back pain and neck pain.  Skin: Negative for rash.  Neurological: Negative for dizziness, light-headedness and headaches.  Hematological: Does not bruise/bleed easily.  Psychiatric/Behavioral: Negative for confusion.     Physical Exam Updated Vital Signs BP (!) 142/83   Pulse 69   Temp 98.4 F (36.9 C) (Oral)   Resp 16   Ht 1.803 m (5\' 11" )   Wt 73.5 kg   SpO2 98%   BMI 22.59 kg/m   Physical Exam Vitals signs and nursing note reviewed.  Constitutional:      Appearance: Normal appearance. Keith Roberson is well-developed.  HENT:     Head: Normocephalic and atraumatic.  Eyes:     Extraocular Movements: Extraocular movements intact.     Conjunctiva/sclera: Conjunctivae normal.     Pupils: Pupils are equal, round, and reactive to light.  Neck:     Musculoskeletal: Normal range of motion and neck supple.  Cardiovascular:     Rate and Rhythm: Normal rate and regular rhythm.  Heart sounds: No murmur.  Pulmonary:     Effort: Pulmonary effort is normal. No respiratory distress.     Breath sounds: Normal breath sounds. No stridor. No wheezing, rhonchi or rales.  Chest:     Chest wall: No tenderness.  Abdominal:     Palpations: Abdomen is soft.     Tenderness: There is no abdominal tenderness.  Musculoskeletal: Normal range of motion.        General: No swelling.  Skin:    General: Skin is warm and dry.  Neurological:     General: No focal deficit present.     Mental Status: Keith Roberson is alert and oriented to person, place, and time.      ED Treatments / Results  Labs (all labs ordered are listed, but only  abnormal results are displayed) Labs Reviewed - No data to display  EKG None  Radiology Dg Chest [facility] 1 View  Result Date: 08/07/2019 CLINICAL DATA:  68 year old male with shortness of breath. EXAM: PORTABLE CHEST 1 VIEW COMPARISON:  Chest radiograph dated 07/27/2019 and CT dated 07/27/2019 FINDINGS: Background of emphysema. No focal consolidation, pleural effusion, or pneumothorax. The cardiac silhouette is within normal limits. No acute osseous pathology. IMPRESSION: No active disease. Electronically Signed   By: Anner Crete M.D.   On: 08/07/2019 22:21    Procedures Procedures (including critical care time)  Medications Ordered in ED Medications - No data to display   Initial Impression / Assessment and Plan / ED Course  I have reviewed the triage vital signs and the nursing notes.  Pertinent labs & imaging results that were available during my care of the patient were reviewed by me and considered in my medical decision making (see chart for details).       Chest x-ray here without any acute findings.  Patient had extensive work-up on October 6 for this complaint including CT angio chest without any significant abnormalities.  Also had lab work-up.  Patient here no respiratory distress oxygen saturations on room air in the upper 90s.  Patient is a smoker so there could possibly be a component of some mild COPD or emphysema.  But no wheezing here again no stridor no rales no leg swelling.  Patient stable for discharge home with follow-up with primary care doctor.   Final Clinical Impressions(s) / ED Diagnoses   Final diagnoses:  SOB (shortness of breath)    ED Discharge Orders    None       Fredia Sorrow, MD 08/07/19 2352

## 2019-08-07 NOTE — ED Notes (Signed)
Pts sister called to check on Pt. She stated she would call back again later.

## 2019-08-07 NOTE — ED Triage Notes (Signed)
Pt from home via [facility] for SOB. Was seen 4 days ago for the same. Pt has hx of untreated lung cancer and htn.  98% on room air. Placed on 2l [place] by ems.  Says hes been feeling bad for 3-4 hours. Says nothing makes it worse.

## 2019-08-28 ENCOUNTER — Other Ambulatory Visit: Payer: Self-pay

## 2019-08-28 ENCOUNTER — Emergency Department (HOSPITAL_COMMUNITY): Payer: Medicare HMO

## 2019-08-28 ENCOUNTER — Emergency Department (HOSPITAL_COMMUNITY)
Admission: EM | Admit: 2019-08-28 | Discharge: 2019-08-28 | Disposition: A | Discharge status: Home / Self Care | Payer: Medicare HMO | Attending: Emergency Medicine | Admitting: Emergency Medicine

## 2019-08-28 ENCOUNTER — Encounter (HOSPITAL_COMMUNITY): Payer: Self-pay

## 2019-08-28 DIAGNOSIS — C3490 Malignant neoplasm of unspecified part of unspecified bronchus or lung: Secondary | ICD-10-CM | POA: Diagnosis not present

## 2019-08-28 DIAGNOSIS — Z20828 Contact with and (suspected) exposure to other viral communicable diseases: Secondary | ICD-10-CM | POA: Insufficient documentation

## 2019-08-28 DIAGNOSIS — F17210 Nicotine dependence, cigarettes, uncomplicated: Secondary | ICD-10-CM | POA: Insufficient documentation

## 2019-08-28 DIAGNOSIS — R0602 Shortness of breath: Secondary | ICD-10-CM | POA: Diagnosis present

## 2019-08-28 DIAGNOSIS — R112 Nausea with vomiting, unspecified: Secondary | ICD-10-CM | POA: Insufficient documentation

## 2019-08-28 DIAGNOSIS — F0390 Unspecified dementia without behavioral disturbance: Secondary | ICD-10-CM | POA: Insufficient documentation

## 2019-08-28 LAB — TROPONIN I (HIGH SENSITIVITY)
Troponin I (High Sensitivity): 4 ng/L (ref <18)
Troponin I (High Sensitivity): 5 ng/L (ref <18)

## 2019-08-28 LAB — COMPREHENSIVE METABOLIC PANEL
ALT: 11 U/L (ref 0–44)
AST: 22 U/L (ref 15–41)
Albumin: 4.1 g/dL (ref 3.5–5.0)
Alkaline Phosphatase: 95 U/L (ref 38–126)
Anion gap: 12 (ref 5–15)
BUN: 14 mg/dL (ref 8–23)
CO2: 26 mmol/L (ref 22–32)
Calcium: 9.2 mg/dL (ref 8.9–10.3)
Chloride: 103 mmol/L (ref 98–111)
Creatinine, Ser: 1.04 mg/dL (ref 0.61–1.24)
GFR calc Af Amer: >60 mL/min (ref >60)
GFR calc non Af Amer: >60 mL/min (ref >60)
Glucose, Bld: 97 mg/dL (ref 70–99)
Potassium: 4 mmol/L (ref 3.5–5.1)
Sodium: 141 mmol/L (ref 135–145)
Total Bilirubin: 1.8 mg/dL — ABNORMAL HIGH (ref 0.3–1.2)
Total Protein: 7.3 g/dL (ref 6.5–8.1)

## 2019-08-28 LAB — CBC WITH DIFFERENTIAL/PLATELET
Abs Immature Granulocytes: 0.04 10*3/uL (ref 0.00–0.07)
Basophils Absolute: 0 10*3/uL (ref 0.0–0.1)
Basophils Relative: 0 %
Eosinophils Absolute: 0 10*3/uL (ref 0.0–0.5)
Eosinophils Relative: 0 %
HCT: 38.9 % — ABNORMAL LOW (ref 39.0–52.0)
Hemoglobin: 13.1 g/dL (ref 13.0–17.0)
Immature Granulocytes: 0 %
Lymphocytes Relative: 17 %
Lymphs Abs: 2 10*3/uL (ref 0.7–4.0)
MCH: 34 pg (ref 26.0–34.0)
MCHC: 33.7 g/dL (ref 30.0–36.0)
MCV: 101 fL — ABNORMAL HIGH (ref 80.0–100.0)
Monocytes Absolute: 0.5 10*3/uL (ref 0.1–1.0)
Monocytes Relative: 4 %
Neutro Abs: 9.5 10*3/uL — ABNORMAL HIGH (ref 1.7–7.7)
Neutrophils Relative %: 79 %
Platelets: 167 10*3/uL (ref 150–400)
RBC: 3.85 MIL/uL — ABNORMAL LOW (ref 4.22–5.81)
RDW: 13.2 % (ref 11.5–15.5)
WBC: 12.1 10*3/uL — ABNORMAL HIGH (ref 4.0–10.5)
nRBC: 0 % (ref 0.0–0.2)

## 2019-08-28 LAB — LIPASE, BLOOD: Lipase: 17 U/L (ref 11–51)

## 2019-08-28 MED ORDER — ONDANSETRON 4 MG PO TBDP: 4.0000 mg | ORAL_TABLET | Freq: Three times a day (TID) | ORAL | 0 refills | Status: DC | PRN

## 2019-08-28 MED ORDER — ALBUTEROL SULFATE HFA 108 (90 BASE) MCG/ACT IN AERS
2.0000 | INHALATION_SPRAY | Freq: Once | RESPIRATORY_TRACT | Status: AC
  Administered 2019-08-28: 2 via RESPIRATORY_TRACT
  Filled 2019-08-28: qty 6.7

## 2019-08-28 MED ORDER — ONDANSETRON HCL 4 MG/2ML IJ SOLN
4.0000 mg | Freq: Once | INTRAMUSCULAR | Status: AC
  Administered 2019-08-28: 08:00:00 4 mg via INTRAVENOUS
  Filled 2019-08-28: qty 2

## 2019-08-28 NOTE — ED Triage Notes (Signed)
Pt reports vomiting since yesterday and woke up sob this morning.  Denies cough or fever.

## 2019-08-28 NOTE — ED Provider Notes (Signed)
Emergency Department Provider Note   I have reviewed the triage vital signs and the nursing notes.   HISTORY  Chief Complaint Shortness of Breath   HPI Keith Roberson is a 68 y.o. male with PMH reviewed below including untreated lung cancer and smoking history presents to the ED with 2 episodes of vomiting followed by SOB.  Patient denies any chest or abdominal pain.  No fevers or chills.  No blood in the emesis.  He denies diarrhea.  No sick contacts.  Patient does continue to smoke cigarettes but has no formal diagnosis of COPD.  He also has active lung cancer but is not undergoing treatment.  He has had several ED visits for similar complaints and tells me that this feels similar. Continues to feel nauseated.   Past Medical History:  Diagnosis Date  . Cancer ([place])    lung  . Dementia ([place])   . Seizures [facility])     Patient Active Problem List   Diagnosis Date Noted  . Alcohol abuse 10/06/2018  . Alcohol intoxication ([place]) 10/06/2018  . Dementia ([place]) 10/06/2018  . Seizures ([place]) 10/06/2018  . Special screening for malignant neoplasms, colon 10/23/2016    History reviewed. No pertinent surgical history.  Allergies Patient has no known allergies.  No family history on file.  Social History Social History   Tobacco Use  . Smoking status: Current Every Day Smoker    Packs/day: 0.50    Types: Cigarettes  . Smokeless tobacco: Never Used  Substance Use Topics  . Alcohol use: Yes    Comment: occ  . Drug use: No    Review of Systems  Constitutional: No fever/chills Eyes: No visual changes. ENT: No sore throat. Cardiovascular: Denies chest pain. Respiratory: Positive shortness of breath. Gastrointestinal: No abdominal pain. Positive nausea and vomiting.  No diarrhea.  No constipation. Genitourinary: Negative for dysuria. Musculoskeletal: Negative for back pain. Skin: Negative for rash. Neurological: Negative for headaches, focal weakness or numbness.   10-point ROS otherwise negative.  ____________________________________________   PHYSICAL EXAM:  VITAL SIGNS: ED Triage Vitals  Enc Vitals Group     BP 08/28/19 0717 (!) 142/89     Pulse Rate 08/28/19 0717 92     Resp 08/28/19 0717 15     Temp 08/28/19 0717 98.3 F (36.8 C)     Temp Source 08/28/19 0717 Oral     SpO2 08/28/19 0717 100 %     Weight 08/28/19 0707 162 lb (73.5 kg)     Height 08/28/19 0707 5\' 11"  (1.803 m)   Constitutional: Alert and oriented. Well appearing and in no acute distress. Eyes: Conjunctivae are normal. Head: Atraumatic. Nose: No congestion/rhinnorhea. Mouth/Throat: Mucous membranes are moist.  Neck: No stridor.  Cardiovascular: Normal rate, regular rhythm. Good peripheral circulation. Grossly normal heart sounds.   Respiratory: Normal respiratory effort.  No retractions. Lungs CTAB. Gastrointestinal: Soft and nontender. No distention.  Musculoskeletal: No lower extremity tenderness nor edema. No gross deformities of extremities. Neurologic:  Normal speech and language.  Skin:  Skin is warm, dry and intact. No rash noted.   ____________________________________________   LABS (all labs ordered are listed, but only abnormal results are displayed)  Labs Reviewed  COMPREHENSIVE METABOLIC PANEL - Abnormal; Notable for the following components:      Result Value   Total Bilirubin 1.8 (*)    All other components within normal limits  CBC WITH DIFFERENTIAL/PLATELET - Abnormal; Notable for the following components:   WBC 12.1 (*)  RBC 3.85 (*)    HCT 38.9 (*)    MCV 101.0 (*)    Neutro Abs 9.5 (*)    All other components within normal limits  SARS CORONAVIRUS 2 (TAT 6-24 HRS)  LIPASE, BLOOD  TROPONIN I (HIGH SENSITIVITY)  TROPONIN I (HIGH SENSITIVITY)   ____________________________________________  EKG   EKG Interpretation  Date/Time:  Saturday August 28 2019 07:14:43 EST Ventricular Rate:  91 PR Interval:    QRS Duration: 77 QT  Interval:  352 QTC Calculation: 433 R Axis:   78 Text Interpretation: Sinus rhythm No STEMI Confirmed by Nanda Quinton [phone]) on 08/28/2019 7:28:11 AM       ____________________________________________  RADIOLOGY  Dg Chest Portable 1 View  Result Date: 08/28/2019 CLINICAL DATA:  Vomiting. EXAM: PORTABLE CHEST 1 VIEW COMPARISON:  Chest radiograph 08/07/2019. FINDINGS: Monitoring leads overlie the patient. Stable cardiac and mediastinal contours. No consolidative pulmonary opacities. No pleural effusion or pneumothorax. Biapical pleuroparenchymal thickening. IMPRESSION: No acute cardiopulmonary process. Electronically Signed   By: Lovey Newcomer M.D.   On: 08/28/2019 08:30    ____________________________________________   PROCEDURES  Procedure(s) performed:   Procedures  None  ____________________________________________   INITIAL IMPRESSION / ASSESSMENT AND PLAN / ED COURSE  Pertinent labs & imaging results that were available during my care of the patient were reviewed by me and considered in my medical decision making (see chart for details).   Patient presents to the emergency department for evaluation of two episodes of vomiting with shortness of breath following.  No hypoxemia or other abnormal vital signs other than mildly elevated blood pressure.  Patient's EKG is reassuring.  Lungs are clear with no audible wheezing, rales, rhonchi.  Lower suspicion for COVID-19.  Patient does have active smoking history as well as history of lung cancer not currently undergoing treatment.  Plan for screening chest x-ray, labs, Zofran, albuterol, reassess.   Lab work, chest x-ray reviewed and are reassuring.  Repeat troponin is unremarkable.  Patient is feeling improved after Zofran.  Patient is drinking fluids here without difficulty.  I do plan for COVID-19 testing with some mild, nonspecific type symptoms.  Patient to remain isolated until his test results come back.  Discussed ED return  precautions.  Provided prescription for Zofran. Patient to call PCP in the AM on Monday for ASAP appointment.  ____________________________________________  FINAL CLINICAL IMPRESSION(S) / ED DIAGNOSES  Final diagnoses:  Non-intractable vomiting with nausea, unspecified vomiting type  SOB (shortness of breath)     MEDICATIONS GIVEN DURING THIS VISIT:  Medications  ondansetron (ZOFRAN) injection 4 mg (4 mg Intravenous Given 08/28/19 0750)  albuterol (VENTOLIN HFA) 108 (90 Base) MCG/ACT inhaler 2 puff (2 puffs Inhalation Given 08/28/19 0759)     NEW OUTPATIENT MEDICATIONS STARTED DURING THIS VISIT:  New Prescriptions   ONDANSETRON (ZOFRAN ODT) 4 MG DISINTEGRATING TABLET    Take 1 tablet (4 mg total) by mouth every 8 (eight) hours as needed.    Note:  This document was prepared using Dragon voice recognition software and may include unintentional dictation errors.  Nanda Quinton, MD, [facility] Emergency Medicine    Long, Wonda Olds, MD 08/28/19 1022

## 2019-08-28 NOTE — Discharge Instructions (Signed)
You were seen in the emergency department today with nausea and vomiting along with shortness of breath.  I am testing you for COVID-19.  Please maintain isolation at home until you get the results of your test.  You can follow the results in the MyChart app.  You will be called with any positive test results.  Take the Zofran as needed for nausea and vomiting.  Please call your primary care doctor first thing Monday morning to schedule a follow-up appointment.

## 2019-08-29 LAB — SARS CORONAVIRUS 2 (TAT 6-24 HRS): SARS Coronavirus 2: NEGATIVE

## 2019-10-04 ENCOUNTER — Other Ambulatory Visit: Payer: Self-pay

## 2019-10-04 ENCOUNTER — Encounter (HOSPITAL_COMMUNITY): Payer: Self-pay | Admitting: *Deleted

## 2019-10-04 ENCOUNTER — Observation Stay (HOSPITAL_COMMUNITY)
Admission: EM | Admit: 2019-10-04 | Discharge: 2019-10-05 | Disposition: A | Discharge status: Home / Self Care | Payer: Medicare HMO | Attending: Family Medicine | Admitting: Family Medicine

## 2019-10-04 ENCOUNTER — Emergency Department (HOSPITAL_COMMUNITY): Payer: Medicare HMO

## 2019-10-04 DIAGNOSIS — N179 Acute kidney failure, unspecified: Secondary | ICD-10-CM | POA: Diagnosis not present

## 2019-10-04 DIAGNOSIS — R0902 Hypoxemia: Secondary | ICD-10-CM

## 2019-10-04 DIAGNOSIS — Z85118 Personal history of other malignant neoplasm of bronchus and lung: Secondary | ICD-10-CM | POA: Diagnosis not present

## 2019-10-04 DIAGNOSIS — Z20828 Contact with and (suspected) exposure to other viral communicable diseases: Secondary | ICD-10-CM | POA: Insufficient documentation

## 2019-10-04 DIAGNOSIS — J9601 Acute respiratory failure with hypoxia: Principal | ICD-10-CM | POA: Insufficient documentation

## 2019-10-04 DIAGNOSIS — F1010 Alcohol abuse, uncomplicated: Secondary | ICD-10-CM | POA: Insufficient documentation

## 2019-10-04 DIAGNOSIS — R0600 Dyspnea, unspecified: Secondary | ICD-10-CM

## 2019-10-04 DIAGNOSIS — F17210 Nicotine dependence, cigarettes, uncomplicated: Secondary | ICD-10-CM | POA: Insufficient documentation

## 2019-10-04 DIAGNOSIS — J441 Chronic obstructive pulmonary disease with (acute) exacerbation: Secondary | ICD-10-CM | POA: Diagnosis not present

## 2019-10-04 DIAGNOSIS — Z7952 Long term (current) use of systemic steroids: Secondary | ICD-10-CM | POA: Diagnosis not present

## 2019-10-04 DIAGNOSIS — R0689 Other abnormalities of breathing: Secondary | ICD-10-CM

## 2019-10-04 DIAGNOSIS — F0390 Unspecified dementia without behavioral disturbance: Secondary | ICD-10-CM | POA: Diagnosis not present

## 2019-10-04 DIAGNOSIS — R0602 Shortness of breath: Secondary | ICD-10-CM

## 2019-10-04 DIAGNOSIS — G40909 Epilepsy, unspecified, not intractable, without status epilepticus: Secondary | ICD-10-CM | POA: Insufficient documentation

## 2019-10-04 DIAGNOSIS — J9691 Respiratory failure, unspecified with hypoxia: Secondary | ICD-10-CM | POA: Diagnosis present

## 2019-10-04 HISTORY — DX: Dyspnea, unspecified: R06.00

## 2019-10-04 HISTORY — DX: Chronic obstructive pulmonary disease, unspecified: J44.9

## 2019-10-04 LAB — CBC WITH DIFFERENTIAL/PLATELET
Abs Immature Granulocytes: 0.05 10*3/uL (ref 0.00–0.07)
Basophils Absolute: 0 10*3/uL (ref 0.0–0.1)
Basophils Relative: 0 %
Eosinophils Absolute: 0 10*3/uL (ref 0.0–0.5)
Eosinophils Relative: 0 %
HCT: 36.6 % — ABNORMAL LOW (ref 39.0–52.0)
Hemoglobin: 11.6 g/dL — ABNORMAL LOW (ref 13.0–17.0)
Immature Granulocytes: 0 %
Lymphocytes Relative: 18 %
Lymphs Abs: 2.5 10*3/uL (ref 0.7–4.0)
MCH: 33.7 pg (ref 26.0–34.0)
MCHC: 31.7 g/dL (ref 30.0–36.0)
MCV: 106.4 fL — ABNORMAL HIGH (ref 80.0–100.0)
Monocytes Absolute: 0.8 10*3/uL (ref 0.1–1.0)
Monocytes Relative: 6 %
Neutro Abs: 10.5 10*3/uL — ABNORMAL HIGH (ref 1.7–7.7)
Neutrophils Relative %: 76 %
Platelets: 154 10*3/uL (ref 150–400)
RBC: 3.44 MIL/uL — ABNORMAL LOW (ref 4.22–5.81)
RDW: 14.6 % (ref 11.5–15.5)
WBC: 13.8 10*3/uL — ABNORMAL HIGH (ref 4.0–10.5)
nRBC: 0.1 % (ref 0.0–0.2)

## 2019-10-04 LAB — URINALYSIS, ROUTINE W REFLEX MICROSCOPIC
Bacteria, UA: NONE SEEN
Bilirubin Urine: NEGATIVE
Glucose, UA: NEGATIVE mg/dL
Hgb urine dipstick: NEGATIVE
Ketones, ur: NEGATIVE mg/dL
Leukocytes,Ua: NEGATIVE
Nitrite: NEGATIVE
Protein, ur: 30 mg/dL — AB
Specific Gravity, Urine: 1.044 — ABNORMAL HIGH (ref 1.005–1.030)
pH: 8 (ref 5.0–8.0)

## 2019-10-04 LAB — COMPREHENSIVE METABOLIC PANEL
ALT: 13 U/L (ref 0–44)
AST: 28 U/L (ref 15–41)
Albumin: 3.6 g/dL (ref 3.5–5.0)
Alkaline Phosphatase: 90 U/L (ref 38–126)
Anion gap: 15 (ref 5–15)
BUN: 13 mg/dL (ref 8–23)
CO2: 24 mmol/L (ref 22–32)
Calcium: 8.7 mg/dL — ABNORMAL LOW (ref 8.9–10.3)
Chloride: 105 mmol/L (ref 98–111)
Creatinine, Ser: 1.33 mg/dL — ABNORMAL HIGH (ref 0.61–1.24)
GFR calc Af Amer: >60 mL/min (ref >60)
GFR calc non Af Amer: 54 mL/min — ABNORMAL LOW (ref >60)
Glucose, Bld: 147 mg/dL — ABNORMAL HIGH (ref 70–99)
Potassium: 3.2 mmol/L — ABNORMAL LOW (ref 3.5–5.1)
Sodium: 144 mmol/L (ref 135–145)
Total Bilirubin: 1.4 mg/dL — ABNORMAL HIGH (ref 0.3–1.2)
Total Protein: 6.6 g/dL (ref 6.5–8.1)

## 2019-10-04 LAB — CBG MONITORING, ED: Glucose-Capillary: 166 mg/dL — ABNORMAL HIGH (ref 70–99)

## 2019-10-04 LAB — MAGNESIUM: Magnesium: 1.9 mg/dL (ref 1.7–2.4)

## 2019-10-04 LAB — POC SARS CORONAVIRUS 2 AG -  ED: SARS Coronavirus 2 Ag: NEGATIVE

## 2019-10-04 LAB — TROPONIN I (HIGH SENSITIVITY)
Troponin I (High Sensitivity): 10 ng/L (ref <18)
Troponin I (High Sensitivity): 10 ng/L (ref <18)

## 2019-10-04 LAB — BRAIN NATRIURETIC PEPTIDE: B Natriuretic Peptide: 154 pg/mL — ABNORMAL HIGH (ref 0.0–100.0)

## 2019-10-04 MED ORDER — POLYETHYLENE GLYCOL 3350 17 G PO PACK: 17.0000 g | PACK | Freq: Every day | ORAL | Status: DC | PRN

## 2019-10-04 MED ORDER — TRAZODONE HCL 50 MG PO TABS: 50.0000 mg | ORAL_TABLET | Freq: Every evening | ORAL | Status: DC | PRN

## 2019-10-04 MED ORDER — METHYLPREDNISOLONE SODIUM SUCC 40 MG IJ SOLR
40.0000 mg | Freq: Three times a day (TID) | INTRAMUSCULAR | Status: DC
  Administered 2019-10-04 – 2019-10-05 (×2): 40 mg via INTRAVENOUS
  Filled 2019-10-04 (×2): qty 1

## 2019-10-04 MED ORDER — ADULT MULTIVITAMIN W/MINERALS CH
1.0000 | ORAL_TABLET | Freq: Every day | ORAL | Status: DC
  Administered 2019-10-05: 1 via ORAL
  Filled 2019-10-04: qty 1

## 2019-10-04 MED ORDER — ALBUTEROL SULFATE HFA 108 (90 BASE) MCG/ACT IN AERS
6.0000 | INHALATION_SPRAY | Freq: Once | RESPIRATORY_TRACT | Status: AC
  Administered 2019-10-04: 6 via RESPIRATORY_TRACT

## 2019-10-04 MED ORDER — ONDANSETRON HCL 4 MG/2ML IJ SOLN: 4.0000 mg | Freq: Four times a day (QID) | INTRAMUSCULAR | Status: DC | PRN

## 2019-10-04 MED ORDER — SODIUM CHLORIDE 0.9 % IV BOLUS
500.0000 mL | Freq: Once | INTRAVENOUS | Status: AC
  Administered 2019-10-04: 500 mL via INTRAVENOUS

## 2019-10-04 MED ORDER — METHYLPREDNISOLONE SODIUM SUCC 125 MG IJ SOLR
125.0000 mg | Freq: Once | INTRAMUSCULAR | Status: AC
  Administered 2019-10-04: 125 mg via INTRAVENOUS
  Filled 2019-10-04: qty 2

## 2019-10-04 MED ORDER — ALBUTEROL SULFATE (2.5 MG/3ML) 0.083% IN NEBU: 2.5000 mg | INHALATION_SOLUTION | RESPIRATORY_TRACT | Status: DC | PRN

## 2019-10-04 MED ORDER — LORAZEPAM 2 MG/ML IJ SOLN: 1.0000 mg | INTRAMUSCULAR | Status: DC | PRN

## 2019-10-04 MED ORDER — GUAIFENESIN ER 600 MG PO TB12
600.0000 mg | ORAL_TABLET | Freq: Two times a day (BID) | ORAL | Status: DC
  Administered 2019-10-05: 600 mg via ORAL
  Filled 2019-10-04: qty 1

## 2019-10-04 MED ORDER — DOXYCYCLINE HYCLATE 100 MG PO TABS
100.0000 mg | ORAL_TABLET | Freq: Two times a day (BID) | ORAL | Status: DC
  Administered 2019-10-04 – 2019-10-05 (×2): 100 mg via ORAL
  Filled 2019-10-04 (×2): qty 1

## 2019-10-04 MED ORDER — THIAMINE HCL 100 MG/ML IJ SOLN: 100.0000 mg | Freq: Every day | INTRAMUSCULAR | Status: DC

## 2019-10-04 MED ORDER — ALBUTEROL SULFATE HFA 108 (90 BASE) MCG/ACT IN AERS
6.0000 | INHALATION_SPRAY | Freq: Once | RESPIRATORY_TRACT | Status: AC
  Administered 2019-10-04: 6 via RESPIRATORY_TRACT
  Filled 2019-10-04: qty 6.7

## 2019-10-04 MED ORDER — SODIUM CHLORIDE 0.9% FLUSH
3.0000 mL | Freq: Two times a day (BID) | INTRAVENOUS | Status: DC
  Administered 2019-10-04 – 2019-10-05 (×2): 3 mL via INTRAVENOUS

## 2019-10-04 MED ORDER — INSULIN ASPART 100 UNIT/ML ~~LOC~~ SOLN: 0.0000 [IU] | Freq: Every day | SUBCUTANEOUS | Status: DC

## 2019-10-04 MED ORDER — SODIUM CHLORIDE 0.9% FLUSH: 3.0000 mL | INTRAVENOUS | Status: DC | PRN

## 2019-10-04 MED ORDER — FLUTICASONE FUROATE-VILANTEROL 200-25 MCG/INH IN AEPB
1.0000 | INHALATION_SPRAY | Freq: Every day | RESPIRATORY_TRACT | Status: DC
  Administered 2019-10-05: 1 via RESPIRATORY_TRACT
  Filled 2019-10-04: qty 28

## 2019-10-04 MED ORDER — LORAZEPAM 1 MG PO TABS: 1.0000 mg | ORAL_TABLET | ORAL | Status: DC | PRN

## 2019-10-04 MED ORDER — ONDANSETRON HCL 4 MG/2ML IJ SOLN
4.0000 mg | Freq: Once | INTRAMUSCULAR | Status: AC
  Administered 2019-10-04: 4 mg via INTRAVENOUS
  Filled 2019-10-04: qty 2

## 2019-10-04 MED ORDER — ACETAMINOPHEN 325 MG PO TABS
650.0000 mg | ORAL_TABLET | Freq: Four times a day (QID) | ORAL | Status: DC | PRN
  Administered 2019-10-04: 650 mg via ORAL
  Filled 2019-10-04: qty 2

## 2019-10-04 MED ORDER — ONDANSETRON HCL 4 MG PO TABS: 4.0000 mg | ORAL_TABLET | Freq: Four times a day (QID) | ORAL | Status: DC | PRN

## 2019-10-04 MED ORDER — INSULIN ASPART 100 UNIT/ML ~~LOC~~ SOLN
0.0000 [IU] | Freq: Three times a day (TID) | SUBCUTANEOUS | Status: DC
  Administered 2019-10-05: 1 [IU] via SUBCUTANEOUS

## 2019-10-04 MED ORDER — ACETAMINOPHEN 650 MG RE SUPP: 650.0000 mg | Freq: Four times a day (QID) | RECTAL | Status: DC | PRN

## 2019-10-04 MED ORDER — ENOXAPARIN SODIUM 30 MG/0.3ML ~~LOC~~ SOLN
30.0000 mg | SUBCUTANEOUS | Status: DC
  Administered 2019-10-04: 30 mg via SUBCUTANEOUS
  Filled 2019-10-04: qty 0.3

## 2019-10-04 MED ORDER — SODIUM CHLORIDE 0.9 % IV SOLN: 250.0000 mL | INTRAVENOUS | Status: DC | PRN

## 2019-10-04 MED ORDER — THIAMINE HCL 100 MG PO TABS
100.0000 mg | ORAL_TABLET | Freq: Every day | ORAL | Status: DC
  Administered 2019-10-05: 100 mg via ORAL
  Filled 2019-10-04: qty 1

## 2019-10-04 MED ORDER — POTASSIUM CHLORIDE CRYS ER 20 MEQ PO TBCR
40.0000 meq | EXTENDED_RELEASE_TABLET | ORAL | Status: AC
  Administered 2019-10-04: 23:00:00 40 meq via ORAL
  Filled 2019-10-04: qty 2

## 2019-10-04 MED ORDER — FOLIC ACID 1 MG PO TABS
1.0000 mg | ORAL_TABLET | Freq: Every day | ORAL | Status: DC
  Administered 2019-10-05: 1 mg via ORAL
  Filled 2019-10-04: qty 1

## 2019-10-04 NOTE — ED Notes (Signed)
Pt gives permission to update his sister Evette Georges on his plan of care, results, and status, Mrs. Rolena Infante was updated and aware of plan of care

## 2019-10-04 NOTE — ED Triage Notes (Signed)
EMS reports pt went to [facility] yesterday with c/o sob.  Reports tested negative for covid yesterday.  Reports was given prescriptions and told to follow up with pulmonologist.  Pt hasn't gotten prescriptions filled yet.  EMS says lungs clear, bp 146/76, hr 105, and o2 sat 100% on room air.  EMS put pt on o2 at [facility] for comfort.

## 2019-10-04 NOTE — H&P (Addendum)
Patient Demographics:    Keith Roberson, is a 68 y.o. male  MRN: [id number]   DOB - 1951-09-21  Admit Date - 10/04/2019  Outpatient Primary MD for the patient is Abran Richard, MD   Assessment & Plan:    Principal Problem:   Respiratory failure with hypoxia [facility]) Active Problems:   COPD with acute exacerbation ([place])   AKI (acute kidney injury) ([place])   Alcohol abuse   Dementia ([place])    1)Acute COPD Exacerbation- no definite pneumonia,  treat empirically with IV Solu-Medrol 40 mg every 8 hours, give mucolytics, doxycycline and bronchodilators as ordered, supplemental oxygen as ordered.   2) acute hypoxic respiratory failure secondary to #1 above--results above #1, used to provide oxygen, -Patient may need home O2 upon discharge  3) history of alcohol abuse--- lorazepam per CIWA protocol, multivitamin as ordered  4)AKI----acute kidney injury creatinine is up to 1.33 from a baseline of 1.0-  renally adjust medications, avoid nephrotoxic agents/dehydration/hypotension -Hydrate and recheck BMP in a.m.  5) hypokalemia--replace potassium, check serum magnesium given history of EtOH abuse hypokalemia  6) questionable history of lung malignancy----unable to verify the patient gives a history of possible lung mass in the past, recent chest imaging studies does not demonstrate any significant lung mass    With History of - Reviewed by me  Past Medical History:  Diagnosis Date  . Cancer ([place])    lung  . Dementia ([place])   . Seizures ([place])       History reviewed. No pertinent surgical history.    Chief Complaint  Patient presents with  . Shortness of Breath      HPI:    Keith Roberson  is a 68 y.o. male with past medical history relevant for history of alcohol abuse with seizures, dementia, tobacco abuse  and presumed COPD who presents with ongoing shortness of breath and respiratory concerns -Patient was seen apparently as per EDP at [facility] ED on 10/03/2019 at that time CTA chest abdomen and pelvis were apparently negative for acute findings and a COVID-19 test was also negative - No fever  Or chills , No Nausea, Vomiting or Diarrhea  -Repeat COVID-19 testing today is pending (POC neg),  -Chest x-ray today without acute findings, there is evidence of underlying emphysema -Troponin is 10-- not elevated, EKG without acute findings -BNP is 154 -WBC with a white count of 13.8 (recent steroid Therapy),  hemoglobin is 11.6 -Platelet count is 154     Review of systems:    In addition to the HPI above,   A full Review of  Systems was done, all other systems reviewed are negative except as noted above in HPI , .    Social History:  Reviewed by me    Social History   Tobacco Use  . Smoking status: Current Every Day Smoker    Packs/day: 0.50    Types: Cigarettes  . Smokeless tobacco: Never Used  Substance Use  Topics  . Alcohol use: Yes    Comment: occ       Family History :  Reviewed by me   HTN   Home Medications:   Prior to Admission medications   Medication Sig Start Date End Date Taking? Authorizing Provider  ondansetron (ZOFRAN ODT) 4 MG disintegrating tablet Take 1 tablet (4 mg total) by mouth every 8 (eight) hours as needed. 08/28/19   Long, Wonda Olds, MD  predniSONE (DELTASONE) 50 MG tablet Take 1 tablet (50 mg total) by mouth daily with breakfast. 07/27/19   Lawyer, Harrell Gave, PA-C     Allergies:    No Known Allergies   Physical Exam:   Vitals  Blood pressure 128/71, pulse 90, temperature 98.9 F (37.2 C), temperature source Oral, resp. rate 17, SpO2 99 %.  Physical Examination: General appearance - alert, chronically ill-appearing, and in no distress  Mental status - alert, oriented to person, place, and time,  Eyes - sclera anicteric Nose- [place] 2  L/min Neck - supple, no JVD elevation , Chest -slightly diminished, few scattered wheezes bilaterally, Heart - S1 and S2 normal, regular  Abdomen - soft, nontender, nondistended, no masses or organomegaly Neurological - screening mental status exam normal, neck supple without rigidity, cranial nerves II through XII intact, DTR's normal and symmetric Extremities - no pedal edema noted, intact peripheral pulses  Skin - warm, dry     Data Review:    CBC Recent Labs  Lab 10/04/19 1226  WBC 13.8*  HGB 11.6*  HCT 36.6*  PLT 154  MCV 106.4*  MCH 33.7  MCHC 31.7  RDW 14.6  LYMPHSABS 2.5  MONOABS 0.8  EOSABS 0.0  BASOSABS 0.0   ------------------------------------------------------------------------------------------------------------------  Chemistries  Recent Labs  Lab 10/04/19 1226  NA 144  K 3.2*  CL 105  CO2 24  GLUCOSE 147*  BUN 13  CREATININE 1.33*  CALCIUM 8.7*  AST 28  ALT 13  ALKPHOS 90  BILITOT 1.4*   ------------------------------------------------------------------------------------------------------------------ CrCl cannot be calculated (Unknown ideal weight.). ------------------------------------------------------------------------------------------------------------------ No results for input(s): TSH, T4TOTAL, T3FREE, THYROIDAB in the last 72 hours.  Invalid input(s): FREET3   Coagulation profile No results for input(s): INR, PROTIME in the last 168 hours. ------------------------------------------------------------------------------------------------------------------- No results for input(s): DDIMER in the last 72 hours. -------------------------------------------------------------------------------------------------------------------  Cardiac Enzymes No results for input(s): CKMB, TROPONINI, MYOGLOBIN in the last 168 hours.  Invalid input(s):  CK ------------------------------------------------------------------------------------------------------------------    Component Value Date/Time   BNP 154.0 (H) 10/04/2019 1226     ---------------------------------------------------------------------------------------------------------------  Urinalysis    Component Value Date/Time   COLORURINE YELLOW 10/04/2019 1301   APPEARANCEUR CLEAR 10/04/2019 1301   LABSPEC 1.044 (H) 10/04/2019 1301   PHURINE 8.0 10/04/2019 1301   GLUCOSEU NEGATIVE 10/04/2019 1301   HGBUR NEGATIVE 10/04/2019 1301   BILIRUBINUR NEGATIVE 10/04/2019 1301   KETONESUR NEGATIVE 10/04/2019 1301   PROTEINUR 30 (A) 10/04/2019 1301   UROBILINOGEN 0.2 10/26/2008 1802   NITRITE NEGATIVE 10/04/2019 1301   LEUKOCYTESUR NEGATIVE 10/04/2019 1301    ----------------------------------------------------------------------------------------------------------------   Imaging Results:    DG Chest Portable 1 View  Result Date: 10/04/2019 CLINICAL DATA:  Cough, weakness, shortness of breath EXAM: PORTABLE CHEST 1 VIEW COMPARISON:  08/28/2019 FINDINGS: Scarring noted in the left mid and lower lung. Heart is normal size. No confluent airspace opacities or effusions. No acute bony abnormality. IMPRESSION: No active disease. Electronically Signed   By: Rolm Baptise M.D.   On: 10/04/2019 13:13    Radiological Exams on Admission: DG  Chest Portable 1 View  Result Date: 10/04/2019 CLINICAL DATA:  Cough, weakness, shortness of breath EXAM: PORTABLE CHEST 1 VIEW COMPARISON:  08/28/2019 FINDINGS: Scarring noted in the left mid and lower lung. Heart is normal size. No confluent airspace opacities or effusions. No acute bony abnormality. IMPRESSION: No active disease. Electronically Signed   By: Rolm Baptise M.D.   On: 10/04/2019 13:13    DVT Prophylaxis -SCD  AM Labs Ordered, also please review Full Orders  Family Communication: Admission, patients condition and plan of care  including tests being ordered have been discussed with the patient  who indicate understanding and agree with the plan   Code Status - Full Code  Likely DC to  home  Condition   stable  Roxan Hockey M.D on 10/04/2019 at 11:02 PM Go to [email or web address] -  for contact info  [facility] - Office  [phone]

## 2019-10-04 NOTE — ED Notes (Signed)
Records received from [facility] in [place], as requested.

## 2019-10-04 NOTE — ED Provider Notes (Signed)
Medical screening examination/treatment/procedure(s) were conducted as a shared visit with non-physician practitioner(s) and myself.  I personally evaluated the patient during the encounter.  EKG Interpretation  Date/Time:  Monday October 04 2019 11:57:51 EST Ventricular Rate:  97 PR Interval:    QRS Duration: 73 QT Interval:  355 QTC Calculation: 451 R Axis:   80 Text Interpretation: Sinus rhythm No significant change since last tracing Confirmed by Dorie Rank [id number] on 10/04/2019 12:08:23 PM  Pt presents with shortness of breath.  On exam notable wheezing.  Decreased o2 sat with ambulation.   Will treat with nebs, reassess.   Dorie Rank, MD 10/04/19 [phone]

## 2019-10-04 NOTE — ED Triage Notes (Signed)
Pt in from home via [facility], c/o SOB, per report pt seen at [facility] ER yesterday with neg COVID, dx of dehydration, pt ST of 142 in route, pt 98% O2 sat pta, placed on 2L [place] d/t comfort per EMS, pt A&O x4

## 2019-10-04 NOTE — ED Provider Notes (Signed)
[facility] EMERGENCY DEPARTMENT Provider Note   CSN: [id number] Arrival date & time: 10/04/19  1110    History Chief Complaint  Patient presents with  . Shortness of Breath    Keith Roberson is a 68 y.o. male with past medical history significant for dementia, seizures, alcohol use who presents for evaluation of SOB. Patient states SOB x 2 days. Seen at [facility] yesterday. Work up unremarkable, dc home with dx of gastritis. Patient has not filled his prescription since dc home. Has not taken anything for symptoms.  Denies current alcohol use in "months."  Had negative Covid test in ED yesterday.  Was also diagnosed with dehydration.  Denies fever, chills, vomiting, chest pain, hemoptysis, abdominal pain, diarrhea, dysuria, swelling in extremities, weight changes, headache, vision changes, congestion, rhinorrhea, sore throat, unilateral leg swelling, history of PE or DVT.  Denies additional aggravating or alleviating factors.  History obtained from patient and past medical records.  No interpreter is used.  HPI     Past Medical History:  Diagnosis Date  . Cancer ([place])    lung  . Dementia ([place])   . Seizures [facility])     Patient Active Problem List   Diagnosis Date Noted  . Respiratory failure with hypoxia ([place]) 10/04/2019  . Alcohol abuse 10/06/2018  . Alcohol intoxication ([place]) 10/06/2018  . Dementia ([place]) 10/06/2018  . Seizures ([place]) 10/06/2018  . Special screening for malignant neoplasms, colon 10/23/2016    History reviewed. No pertinent surgical history.     No family history on file.  Social History   Tobacco Use  . Smoking status: Current Every Day Smoker    Packs/day: 0.50    Types: Cigarettes  . Smokeless tobacco: Never Used  Substance Use Topics  . Alcohol use: Yes    Comment: occ  . Drug use: No    Home Medications Prior to Admission medications   Medication Sig Start Date End Date Taking? Authorizing Provider  ondansetron (ZOFRAN ODT) 4 MG  disintegrating tablet Take 1 tablet (4 mg total) by mouth every 8 (eight) hours as needed. 08/28/19   Long, Wonda Olds, MD  predniSONE (DELTASONE) 50 MG tablet Take 1 tablet (50 mg total) by mouth daily with breakfast. 07/27/19   Dalia Heading, PA-C    Allergies    Patient has no known allergies.  Review of Systems   Review of Systems  Constitutional: Negative.   HENT: Negative.   Eyes: Negative.   Respiratory: Positive for cough and shortness of breath. Negative for apnea, choking, chest tightness, wheezing and stridor.   Cardiovascular: Negative.   Gastrointestinal: Negative.   Genitourinary: Negative.   Musculoskeletal: Negative.   Skin: Negative.   Neurological: Negative.   All other systems reviewed and are negative.   Physical Exam Updated Vital Signs BP 124/63   Pulse 96   Temp 98.9 F (37.2 C) (Oral)   Resp 13   SpO2 95%   Physical Exam Vitals and nursing note reviewed.  Constitutional:      General: He is not in acute distress.    Appearance: He is ill-appearing (Chronically ill appearing). He is not toxic-appearing or diaphoretic.  HENT:     Head: Normocephalic and atraumatic.     Jaw: There is normal jaw occlusion.     Comments: White scrabble coating to tongue consistent with thrush. Dry mucous membranes    Right Ear: Tympanic membrane, ear canal and external ear normal. There is no impacted cerumen. No hemotympanum. Tympanic membrane is  not injected, scarred, perforated, erythematous, retracted or bulging.     Left Ear: Tympanic membrane, ear canal and external ear normal. There is no impacted cerumen. No hemotympanum. Tympanic membrane is not injected, scarred, perforated, erythematous, retracted or bulging.     Ears:     Comments: No Mastoid tenderness.    Nose:     Comments: Clear rhinorrhea and congestion to bilateral nares.  No sinus tenderness.    Mouth/Throat:     Mouth: Mucous membranes are dry.     Tongue: Tongue does not deviate from midline.       Comments: Posterior oropharynx clear.  Mucous membranes moist.  Tonsils without erythema or exudate.  Uvula midline without deviation.  No evidence of PTA or RPA.  No drooling, dysphasia or trismus.  Phonation normal. Neck:     Trachea: Trachea and phonation normal.     Meningeal: Brudzinski's sign and Kernig's sign absent.     Comments: No Neck stiffness or neck rigidity.  No meningismus.  No cervical lymphadenopathy. Cardiovascular:     Comments: No murmurs rubs or gallops. Pulmonary:     Comments: Course rhonchi and wheeze diffusely.  No accessory muscle usage.  Able speak in full sentences. Abdominal:     Comments: Soft, nontender without rebound or guarding.  No CVA tenderness.  Musculoskeletal:     Cervical back: Full passive range of motion without pain and normal range of motion.     Comments: Moves all 4 extremities without difficulty.  Lower extremities without edema, erythema or warmth.  Skin:    Comments: Brisk capillary refill.  No rashes or lesions.  Neurological:     Mental Status: He is alert.     Comments: Ambulatory in department without difficulty.  Cranial nerves II through XII grossly intact.  No facial droop.  No aphasia.    ED Results / Procedures / Treatments   Labs (all labs ordered are listed, but only abnormal results are displayed) Labs Reviewed  CBC WITH DIFFERENTIAL/PLATELET - Abnormal; Notable for the following components:      Result Value   WBC 13.8 (*)    RBC 3.44 (*)    Hemoglobin 11.6 (*)    HCT 36.6 (*)    MCV 106.4 (*)    Neutro Abs 10.5 (*)    All other components within normal limits  COMPREHENSIVE METABOLIC PANEL - Abnormal; Notable for the following components:   Potassium 3.2 (*)    Glucose, Bld 147 (*)    Creatinine, Ser 1.33 (*)    Calcium 8.7 (*)    Total Bilirubin 1.4 (*)    GFR calc non Af Amer 54 (*)    All other components within normal limits  BRAIN NATRIURETIC PEPTIDE - Abnormal; Notable for the following components:    B Natriuretic Peptide 154.0 (*)    All other components within normal limits  URINALYSIS, ROUTINE W REFLEX MICROSCOPIC - Abnormal; Notable for the following components:   Specific Gravity, Urine 1.044 (*)    Protein, ur 30 (*)    All other components within normal limits  SARS CORONAVIRUS 2 (TAT 6-24 HRS)  POC SARS CORONAVIRUS 2 AG -  ED  TROPONIN I (HIGH SENSITIVITY)  TROPONIN I (HIGH SENSITIVITY)    EKG EKG Interpretation  Date/Time:  Monday October 04 2019 11:57:51 EST Ventricular Rate:  97 PR Interval:    QRS Duration: 73 QT Interval:  355 QTC Calculation: 451 R Axis:   80 Text Interpretation: Sinus rhythm No significant  change since last tracing Confirmed by Dorie Rank [id number]) on 10/04/2019 12:08:23 PM   Radiology DG Chest Portable 1 View  Result Date: 10/04/2019 CLINICAL DATA:  Cough, weakness, shortness of breath EXAM: PORTABLE CHEST 1 VIEW COMPARISON:  08/28/2019 FINDINGS: Scarring noted in the left mid and lower lung. Heart is normal size. No confluent airspace opacities or effusions. No acute bony abnormality. IMPRESSION: No active disease. Electronically Signed   By: Rolm Baptise M.D.   On: 10/04/2019 13:13    Procedures Procedures (including critical care time)  Medications Ordered in ED Medications  fluticasone furoate-vilanterol (BREO ELLIPTA) 200-25 MCG/INH 1 puff (1 puff Inhalation Not Given 10/04/19 1824)  albuterol (VENTOLIN HFA) 108 (90 Base) MCG/ACT inhaler 6 puff (6 puffs Inhalation Given 10/04/19 1257)  sodium chloride 0.9 % bolus 500 mL (0 mLs Intravenous Stopped 10/04/19 1501)  ondansetron (ZOFRAN) injection 4 mg (4 mg Intravenous Given 10/04/19 1254)  albuterol (VENTOLIN HFA) 108 (90 Base) MCG/ACT inhaler 6 puff (6 puffs Inhalation Given 10/04/19 1530)  methylPREDNISolone sodium succinate (SOLU-MEDROL) 125 mg/2 mL injection 125 mg (125 mg Intravenous Given 10/04/19 1532)   ED Course  I have reviewed the triage vital signs and the nursing  notes.  Pertinent labs & imaging results that were available during my care of the patient were reviewed by me and considered in my medical decision making (see chart for details).  68 year old presents for evaluation of shortness of breath.  Has since been seen here previously for same.  Was actually seen at outpatient ED in [facility] yesterday with similar symptoms.  Patient had extensive work-up including CT chest, abdomen, blood cultures, lactic acid, troponin.  Diagnosed with thrush, dehydration and gastritis and DC'd home.  Patient did not fill these prescriptions.  He is afebrile, nonseptic appearing.  He does appear chronically ill though.  No evidence of fluid overload on exam.  Homans' sign negative, no edema, erythema or warmth or tenderness of bilateral calves.  No evidence of DVT.  His heart is clear. Rhonchi and mild wheeze throughout however speaks in full sentences without difficulty.  He is 100% on room air without supplemental oxygen.  Abdomen soft, nontender without rebound or guarding. Does not appear in any acute distress currently.  Plan for additional labs, chest x-ray, EKG and reevaluate.  Records obtained and personally reviewed from [facility] radiology department ED.  Patient with CTA chest abdomen pelvis yesterday.  No PE, pulmonary edema, pneumothorax or pneumonia on chest CT.  Did show empahsema and COPDDid show he had some thickening of his esophagus consistent with esophagitis.  No acute findings in abdomen.  Given patient has no tachycardia, tachypnea or hypoxia do not think patient needs repeat D-dimer or CTA of his chest at this time. Patient had negative delta troponin at ED yesterday.  BNP was at baseline yesterday.  No evidence of cardiomegaly or heart failure.  WBC improved from yesterday hemoglobin stable patient had blood cultures obtained yesterday have not resulted yet.  Analysis from yesterday negative for infection.  [facility] Covid, flu, RSV negative yesterday.    Labs and imaging personally reviewed: Troponin negative CMP with mild hypokalemia at 3.2, creatinine at 1.33 CBC with leukocytosis at baseline, Hgb stable. Low suspicion for variceal bleed. Urinalysis consistent with dehydration. Given IVF. Chest xray without cardiomegaly, pulmonary edema, pneumothorax. EKG without STEMI  Patient reassessed.  Patient ambulated however became hypoxic to 85% on room air.  Per CTA read from yesterday patient does have emphysema.  Will treat with  albuterol, steroids and reevaluate.   Wheeze improved with steroids and albuterol however he continues to have decreased O2 saturation with ambulation.  He is able to recover after resting for a few minutes however drops to 83% RA however improved to 97% on RA after 3-4 minutes.  Does had PCP follow-up, per patient. Daughter states does have PC however has not seen PCP in "a long time."  Unfortunately cannot obtain home oxygen from case management at this time.  Will need admission for likely acute on chronic COPD exacerbation. Low suspicion for ACS, PE, dissection, COVID as cause of symptoms at this time.  Rapid Covid in ED negative, will obtain outpatient Covid testing.  Not requiring oxygen without moment, 95% on room air however does continue to become hypoxic with significant dyspnea with exertion with minimal ambulation.  Given recent CT with emphysema likely acute on chronic COPD exacerbation?  Patient likely with hypoxia at home which is why has been seen in the emergency department multiple times for shortness of breath. Question whether chronic in nature. Patient's sister who is also patient's medical power of attorney states he has dementia and is also unsafe to be at home. They are currently in talks of placing him at a facility. Patient would benefit from overnight obs to get him set up with home oxygen.  The patient appears reasonably stabilized for admission considering the current resources, flow, and  capabilities available in the ED at this time, and I doubt any other [facility] requiring further screening and/or treatment in the ED prior to admission.    MDM Rules/Calculators/A&P                       Final Clinical Impression(s) / ED Diagnoses Final diagnoses:  Hypoxia  Shortness of breath    Rx / DC Orders ED Discharge Orders    None       Mattia Liford A, PA-C 10/04/19 Apolonio Schneiders, MD 10/05/19 1313

## 2019-10-04 NOTE — ED Notes (Signed)
Pt did ambulate well, but his oxygen dropped from 99% to 85%.

## 2019-10-04 NOTE — ED Notes (Addendum)
ALL PO MEDICATIONS GIVEN WITH APPLESAUCE- PT HAD DIFFICULT TIME SWALLOWING PILLS- Pt says he has been taking pills with applesauce for about a year. Dr Joesph Fillers made aware of this.

## 2019-10-05 ENCOUNTER — Observation Stay (HOSPITAL_COMMUNITY): Payer: Medicare HMO

## 2019-10-05 ENCOUNTER — Encounter (HOSPITAL_COMMUNITY): Payer: Self-pay | Admitting: Family Medicine

## 2019-10-05 DIAGNOSIS — F1010 Alcohol abuse, uncomplicated: Secondary | ICD-10-CM | POA: Diagnosis not present

## 2019-10-05 DIAGNOSIS — J9601 Acute respiratory failure with hypoxia: Secondary | ICD-10-CM | POA: Diagnosis not present

## 2019-10-05 DIAGNOSIS — J441 Chronic obstructive pulmonary disease with (acute) exacerbation: Secondary | ICD-10-CM | POA: Diagnosis not present

## 2019-10-05 DIAGNOSIS — N179 Acute kidney failure, unspecified: Secondary | ICD-10-CM | POA: Diagnosis not present

## 2019-10-05 LAB — BASIC METABOLIC PANEL
Anion gap: 9 (ref 5–15)
BUN: 12 mg/dL (ref 8–23)
CO2: 26 mmol/L (ref 22–32)
Calcium: 8.5 mg/dL — ABNORMAL LOW (ref 8.9–10.3)
Chloride: 109 mmol/L (ref 98–111)
Creatinine, Ser: 1.1 mg/dL (ref 0.61–1.24)
GFR calc Af Amer: >60 mL/min (ref >60)
GFR calc non Af Amer: >60 mL/min (ref >60)
Glucose, Bld: 157 mg/dL — ABNORMAL HIGH (ref 70–99)
Potassium: 4.1 mmol/L (ref 3.5–5.1)
Sodium: 144 mmol/L (ref 135–145)

## 2019-10-05 LAB — CBC
HCT: 33.5 % — ABNORMAL LOW (ref 39.0–52.0)
Hemoglobin: 10.9 g/dL — ABNORMAL LOW (ref 13.0–17.0)
MCH: 33.7 pg (ref 26.0–34.0)
MCHC: 32.5 g/dL (ref 30.0–36.0)
MCV: 103.7 fL — ABNORMAL HIGH (ref 80.0–100.0)
Platelets: 143 10*3/uL — ABNORMAL LOW (ref 150–400)
RBC: 3.23 MIL/uL — ABNORMAL LOW (ref 4.22–5.81)
RDW: 14.6 % (ref 11.5–15.5)
WBC: 9 10*3/uL (ref 4.0–10.5)
nRBC: 0 % (ref 0.0–0.2)

## 2019-10-05 LAB — HIV ANTIBODY (ROUTINE TESTING W REFLEX): HIV Screen 4th Generation wRfx: NONREACTIVE

## 2019-10-05 LAB — GLUCOSE, CAPILLARY
Glucose-Capillary: 135 mg/dL — ABNORMAL HIGH (ref 70–99)
Glucose-Capillary: 122 mg/dL — ABNORMAL HIGH (ref 70–99)

## 2019-10-05 LAB — SARS CORONAVIRUS 2 (TAT 6-24 HRS): SARS Coronavirus 2: NEGATIVE

## 2019-10-05 MED ORDER — THIAMINE HCL 100 MG PO TABS: 100.0000 mg | ORAL_TABLET | Freq: Every day | ORAL | Status: DC

## 2019-10-05 MED ORDER — ALBUTEROL SULFATE HFA 108 (90 BASE) MCG/ACT IN AERS: 2.0000 | INHALATION_SPRAY | RESPIRATORY_TRACT | 1 refills | Status: DC | PRN

## 2019-10-05 MED ORDER — FOLIC ACID 1 MG PO TABS: 1.0000 mg | ORAL_TABLET | Freq: Every day | ORAL | Status: DC

## 2019-10-05 MED ORDER — PREDNISONE 20 MG PO TABS: 40.0000 mg | ORAL_TABLET | Freq: Every day | ORAL | 0 refills | Status: AC

## 2019-10-05 MED ORDER — ADULT MULTIVITAMIN W/MINERALS CH: 1.0000 | ORAL_TABLET | Freq: Every day | ORAL | Status: DC

## 2019-10-05 MED ORDER — DOXYCYCLINE HYCLATE 100 MG PO TABS: 100.0000 mg | ORAL_TABLET | Freq: Two times a day (BID) | ORAL | 0 refills | Status: AC

## 2019-10-05 MED ORDER — AMLODIPINE BESYLATE 5 MG PO TABS
5.0000 mg | ORAL_TABLET | Freq: Every day | ORAL | Status: DC
  Administered 2019-10-05: 5 mg via ORAL
  Filled 2019-10-05: qty 1

## 2019-10-05 MED ORDER — AMLODIPINE BESYLATE 5 MG PO TABS: 5.0000 mg | ORAL_TABLET | Freq: Every day | ORAL | 0 refills | Status: DC

## 2019-10-05 NOTE — Progress Notes (Signed)
SATURATION QUALIFICATIONS: (This note is used to comply with regulatory documentation for home oxygen)  Patient Saturations on Room Air at Rest =100%  Patient Saturations on Room Air while Ambulating = 100%

## 2019-10-05 NOTE — Discharge Summary (Addendum)
Physician Discharge Summary  Keith Roberson CBJ:[id number] DOB: 02-04-1951 DOA: 10/04/2019  PCP: Keith Richard, MD  Admit date: 10/04/2019 Discharge date: 10/05/2019  Admitted From:  Home  Disposition:  Home   Recommendations for Outpatient Follow-up:  1. Follow up with PCP in 1 weeks 2. Adjust BP medication as needed.  3. Ambulatory alcohol treatment program recommended 4. Tobacco cessation program recommended  Discharge Condition: STABLE   CODE STATUS: FULL    Brief Hospitalization Summary: Please see all hospital notes, images, labs for full details of the hospitalization. Admission HPI:  Keith Roberson  is a 68 y.o. male with past medical history relevant for history of alcohol abuse with seizures, dementia, tobacco abuse and presumed COPD who presents with ongoing shortness of breath and respiratory concerns -Patient was seen apparently as per EDP at [facility] ED on 10/03/2019 at that time CTA chest abdomen and pelvis were apparently negative for acute findings and a COVID-19 test was also negative -No fever  Or chills , No Nausea, Vomiting or Diarrhea  -Repeat COVID-19 testing today is pending (POC neg),  -Chest x-ray today without acute findings, there is evidence of underlying emphysema -Troponin is 10-- not elevated, EKG without acute findings -BNP is 154 -WBC with a white count of 13.8 (recent steroid Therapy),  hemoglobin is 11.6 -Platelet count is 154  1)Acute COPD Exacerbation- no definite pneumonia,  treated empirically with IV Solu-Medrol 40 mg every 8 hours with good improvement in symptoms.  He was given mucolytics, doxycycline and bronchodilators and supplemental oxygen as ordered.   He will be evaluated for supplemental oxygen need before discharge and will order Home oxygen if qualifies.  He will discharge home on prednisone 40 mg daily x 5 days, doxycycline and albuterol HFA.   2) acute hypoxic respiratory failure secondary to #1 above--results above #1, used to  provide oxygen,  He is now weaned to room air.   He will have a home oxygen evaluation prior to discharge.   3) history of alcohol abuse--- lorazepam per CIWA protocol, multivitamin, thiamine and folic acid ordered.   4)AKI----RESOLVED with hydration.   5) hypokalemia--this has been repleted.   6) questionable history of lung malignancy---unable to verify the patient gives a history of possible lung mass in the past, recent chest imaging studies does not demonstrate any significant lung mass, follow up with PCP recommended.   7) Essential hypertension - amlodipine 5 mg daily.    Discharge Diagnoses:  Principal Problem:   Respiratory failure with hypoxia ([place]) Active Problems:   Alcohol abuse   Dementia (HCC)   COPD with acute exacerbation (HCC)   AKI (acute kidney injury) [facility])   Discharge Instructions:  Allergies as of 10/05/2019   No Known Allergies     Medication List    TAKE these medications   albuterol 108 (90 Base) MCG/ACT inhaler Commonly known as: VENTOLIN HFA Inhale 2 puffs into the lungs every 4 (four) hours as needed for wheezing or shortness of breath (cough, shortness of breath or wheezing.).   amLODipine 5 MG tablet Commonly known as: NORVASC Take 1 tablet (5 mg total) by mouth daily. Start taking on: October 06, 2019   doxycycline 100 MG tablet Commonly known as: VIBRA-TABS Take 1 tablet (100 mg total) by mouth every 12 (twelve) hours for 3 days.   folic acid 1 MG tablet Commonly known as: FOLVITE Take 1 tablet (1 mg total) by mouth daily. Start taking on: October 06, 2019   multivitamin with minerals Tabs  tablet Take 1 tablet by mouth daily. Start taking on: October 06, 2019   ondansetron 4 MG disintegrating tablet Commonly known as: Zofran ODT Take 1 tablet (4 mg total) by mouth every 8 (eight) hours as needed.   predniSONE 20 MG tablet Commonly known as: DELTASONE Take 2 tablets (40 mg total) by mouth daily with breakfast for 5  days. What changed:   medication strength  how much to take   thiamine 100 MG tablet Take 1 tablet (100 mg total) by mouth daily. Start taking on: October 06, 2019      Follow-up Information    Keith Richard, MD. Schedule an appointment as soon as possible for a visit in 1 week(s).   Specialty: Internal Medicine Why: Hospital follow Up  Contact information: [address] [place] [address] [phone]          No Known Allergies Allergies as of 10/05/2019   No Known Allergies     Medication List    TAKE these medications   albuterol 108 (90 Base) MCG/ACT inhaler Commonly known as: VENTOLIN HFA Inhale 2 puffs into the lungs every 4 (four) hours as needed for wheezing or shortness of breath (cough, shortness of breath or wheezing.).   amLODipine 5 MG tablet Commonly known as: NORVASC Take 1 tablet (5 mg total) by mouth daily. Start taking on: October 06, 2019   doxycycline 100 MG tablet Commonly known as: VIBRA-TABS Take 1 tablet (100 mg total) by mouth every 12 (twelve) hours for 3 days.   folic acid 1 MG tablet Commonly known as: FOLVITE Take 1 tablet (1 mg total) by mouth daily. Start taking on: October 06, 2019   multivitamin with minerals Tabs tablet Take 1 tablet by mouth daily. Start taking on: October 06, 2019   ondansetron 4 MG disintegrating tablet Commonly known as: Zofran ODT Take 1 tablet (4 mg total) by mouth every 8 (eight) hours as needed.   predniSONE 20 MG tablet Commonly known as: DELTASONE Take 2 tablets (40 mg total) by mouth daily with breakfast for 5 days. What changed:   medication strength  how much to take   thiamine 100 MG tablet Take 1 tablet (100 mg total) by mouth daily. Start taking on: October 06, 2019       Procedures/Studies: Venous Doppler Keith Roberson ONLY  Result Date: 10/05/2019 CLINICAL DATA:  Dyspnea and respiratory failure. EXAM: BILATERAL LOWER EXTREMITY VENOUS DOPPLER ULTRASOUND  TECHNIQUE: Gray-scale sonography with graded compression, as well as color Doppler and duplex ultrasound were performed to evaluate the lower extremity deep venous systems from the level of the common femoral vein and including the common femoral, femoral, profunda femoral, popliteal and calf veins including the posterior tibial, peroneal and gastrocnemius veins when visible. The superficial great saphenous vein was also interrogated. Spectral Doppler was utilized to evaluate flow at rest and with distal augmentation maneuvers in the common femoral, femoral and popliteal veins. COMPARISON:  None. FINDINGS: RIGHT LOWER EXTREMITY Common Femoral Vein: No evidence of thrombus. Normal compressibility, respiratory phasicity and response to augmentation. Saphenofemoral Junction: No evidence of thrombus. Normal compressibility and flow on color Doppler imaging. Profunda Femoral Vein: No evidence of thrombus. Normal compressibility and flow on color Doppler imaging. Femoral Vein: No evidence of thrombus. Normal compressibility, respiratory phasicity and response to augmentation. Popliteal Vein: No evidence of thrombus. Normal compressibility, respiratory phasicity and response to augmentation. Calf Veins: No evidence of thrombus. Normal compressibility and flow on color Doppler imaging. Superficial Great  Saphenous Vein: No evidence of thrombus. Normal compressibility. Venous Reflux:  None. Other Findings: No evidence of superficial thrombophlebitis or abnormal fluid collection. LEFT LOWER EXTREMITY Common Femoral Vein: No evidence of thrombus. Normal compressibility, respiratory phasicity and response to augmentation. Saphenofemoral Junction: No evidence of thrombus. Normal compressibility and flow on color Doppler imaging. Profunda Femoral Vein: No evidence of thrombus. Normal compressibility and flow on color Doppler imaging. Femoral Vein: No evidence of thrombus. Normal compressibility, respiratory phasicity and response  to augmentation. Popliteal Vein: No evidence of thrombus. Normal compressibility, respiratory phasicity and response to augmentation. Calf Veins: No evidence of thrombus. Normal compressibility and flow on color Doppler imaging. Superficial Great Saphenous Vein: No evidence of thrombus. Normal compressibility. Venous Reflux:  None. Other Findings: No evidence of superficial thrombophlebitis or abnormal fluid collection. IMPRESSION: No evidence of deep venous thrombosis in either lower extremity. Electronically Signed   By: Aletta Edouard M.D.   On: 10/05/2019 11:19   DG Chest Portable 1 View  Result Date: 10/04/2019 CLINICAL DATA:  Cough, weakness, shortness of breath EXAM: PORTABLE CHEST 1 VIEW COMPARISON:  08/28/2019 FINDINGS: Scarring noted in the left mid and lower lung. Heart is normal size. No confluent airspace opacities or effusions. No acute bony abnormality. IMPRESSION: No active disease. Electronically Signed   By: Rolm Baptise M.D.   On: 10/04/2019 13:13      Subjective: Pt reports feeling better.  He says that he would like to go home, no chest pain and no SOB.   Discharge Exam: Vitals:   10/05/19 0508 10/05/19 0932  BP: (!) 150/95   Pulse: 79   Resp: 18   Temp: 98.2 F (36.8 C)   SpO2:  98%   Vitals:   10/05/19 0200 10/05/19 0413 10/05/19 0508 10/05/19 0932  BP: 135/71 (!) 150/95 (!) 150/95   Pulse: 92 79 79   Resp: 18 18 18    Temp:  98.2 F (36.8 C) 98.2 F (36.8 C)   TempSrc:  Oral Oral   SpO2: 97% 100%  98%   General: Pt is alert, awake, not in acute distress Cardiovascular: normal S1/S2 +, no rubs, no gallops Respiratory: CTA bilaterally, no wheezing, no rhonchi Abdominal: Soft, NT, ND, bowel sounds + Extremities: no edema, no cyanosis   The results of significant diagnostics from this hospitalization (including imaging, microbiology, ancillary and laboratory) are listed below for reference.     Microbiology: Recent Results (from the past 240 hour(s))   SARS CORONAVIRUS 2 (TAT 6-24 HRS) Nasopharyngeal Nasopharyngeal Swab     Status: None   Collection Time: 10/04/19  5:47 PM   Specimen: Nasopharyngeal Swab  Result Value Ref Range Status   SARS Coronavirus 2 NEGATIVE NEGATIVE Final    Comment: (NOTE) SARS-CoV-2 target nucleic acids are NOT DETECTED. The SARS-CoV-2 RNA is generally detectable in upper and lower respiratory specimens during the acute phase of infection. Negative results do not preclude SARS-CoV-2 infection, do not rule out co-infections with other pathogens, and should not be used as the sole basis for treatment or other patient management decisions. Negative results must be combined with clinical observations, patient history, and epidemiological information. The expected result is Negative. Fact Sheet for Patients: SugarRoll.be Fact Sheet for Healthcare Providers: https://www.woods-mathews.com/ This test is not yet approved or cleared by the [place] FDA and  has been authorized for detection and/or diagnosis of SARS-CoV-2 by FDA under an Emergency Use Authorization (EUA). This EUA will remain  in effect (meaning this test can be used)  for the duration of the COVID-19 declaration under Section 56 4(b)(1) of the Act, 21 U.S.C. section 360bbb-3(b)(1), unless the authorization is terminated or revoked sooner. Performed at [facility], [place] [address] [place], [place] [address]      Labs: BNP (last 3 results) Recent Labs    07/27/19 0914 10/04/19 1226  BNP 44.0 967.8*   Basic Metabolic Panel: Recent Labs  Lab 10/04/19 1226 10/04/19 1421 10/05/19 0550  Roberson 144  --  144  K 3.2*  --  4.1  CL 105  --  109  CO2 24  --  26  GLUCOSE 147*  --  157*  BUN 13  --  12  CREATININE 1.33*  --  1.10  CALCIUM 8.7*  --  8.5*  MG  --  1.9  --    Liver Function Tests: Recent Labs  Lab 10/04/19 1226  AST 28  ALT 13  ALKPHOS 90  BILITOT 1.4*  PROT 6.6  ALBUMIN  3.6   No results for input(s): LIPASE, AMYLASE in the last 168 hours. No results for input(s): AMMONIA in the last 168 hours. CBC: Recent Labs  Lab 10/04/19 1226 10/05/19 0550  WBC 13.8* 9.0  NEUTROABS 10.5*  --   HGB 11.6* 10.9*  HCT 36.6* 33.5*  MCV 106.4* 103.7*  PLT 154 143*   Cardiac Enzymes: No results for input(s): CKTOTAL, CKMB, CKMBINDEX, TROPONINI in the last 168 hours. BNP: Invalid input(s): POCBNP CBG: Recent Labs  Lab 10/04/19 2321 10/05/19 0726 10/05/19 1139  GLUCAP 166* 135* 122*   D-Dimer No results for input(s): DDIMER in the last 72 hours. Hgb A1c No results for input(s): HGBA1C in the last 72 hours. Lipid Profile No results for input(s): CHOL, HDL, LDLCALC, TRIG, CHOLHDL, LDLDIRECT in the last 72 hours. Thyroid function studies No results for input(s): TSH, T4TOTAL, T3FREE, THYROIDAB in the last 72 hours.  Invalid input(s): FREET3 Anemia work up No results for input(s): VITAMINB12, FOLATE, FERRITIN, TIBC, IRON, RETICCTPCT in the last 72 hours. Urinalysis    Component Value Date/Time   COLORURINE YELLOW 10/04/2019 1301   APPEARANCEUR CLEAR 10/04/2019 1301   LABSPEC 1.044 (H) 10/04/2019 1301   PHURINE 8.0 10/04/2019 1301   GLUCOSEU NEGATIVE 10/04/2019 1301   HGBUR NEGATIVE 10/04/2019 1301   BILIRUBINUR NEGATIVE 10/04/2019 1301   KETONESUR NEGATIVE 10/04/2019 1301   PROTEINUR 30 (A) 10/04/2019 1301   UROBILINOGEN 0.2 10/26/2008 1802   NITRITE NEGATIVE 10/04/2019 1301   LEUKOCYTESUR NEGATIVE 10/04/2019 1301   Sepsis Labs Invalid input(s): PROCALCITONIN,  WBC,  LACTICIDVEN Microbiology Recent Results (from the past 240 hour(s))  SARS CORONAVIRUS 2 (TAT 6-24 HRS) Nasopharyngeal Nasopharyngeal Swab     Status: None   Collection Time: 10/04/19  5:47 PM   Specimen: Nasopharyngeal Swab  Result Value Ref Range Status   SARS Coronavirus 2 NEGATIVE NEGATIVE Final    Comment: (NOTE) SARS-CoV-2 target nucleic acids are NOT DETECTED. The  SARS-CoV-2 RNA is generally detectable in upper and lower respiratory specimens during the acute phase of infection. Negative results do not preclude SARS-CoV-2 infection, do not rule out co-infections with other pathogens, and should not be used as the sole basis for treatment or other patient management decisions. Negative results must be combined with clinical observations, patient history, and epidemiological information. The expected result is Negative. Fact Sheet for Patients: SugarRoll.be Fact Sheet for Healthcare Providers: https://www.woods-mathews.com/ This test is not yet approved or cleared by the [place] FDA and  has been authorized for  detection and/or diagnosis of SARS-CoV-2 by FDA under an Emergency Use Authorization (EUA). This EUA will remain  in effect (meaning this test can be used) for the duration of the COVID-19 declaration under Section 56 4(b)(1) of the Act, 21 U.S.C. section 360bbb-3(b)(1), unless the authorization is terminated or revoked sooner. Performed at [facility], [place] [address], [place], [place] [address]    Time coordinating discharge:   SIGNED:  Irwin Brakeman, MD  [facility] 10/05/2019, 12:25 PM How to contact the [facility] Attending or Consulting provider [place] or covering provider during after hours [place], for this patient?  1. Check the care team in [facility] and look for a) attending/consulting [facility] provider listed and b) the [facility] team listed 2. Log into [email or web address] and use [place]'s universal password to access. If you do not have the password, please contact the hospital operator. 3. Locate the [facility] provider you are looking for under [facility] and page to a number that you can be directly reached. 4. If you still have difficulty reaching the provider, please page the [facility] (Director on Call) for the Hospitalists listed on amion for assistance.

## 2019-10-05 NOTE — Discharge Instructions (Signed)
Dehydration, Adult  Dehydration is when there is not enough fluid or water in your body. This happens when you lose more fluids than you take in. Dehydration can range from mild to very bad. It should be treated right away to keep it from getting very bad. Symptoms of mild dehydration may include:  Thirst.  Dry lips.  Slightly dry mouth.  Dry, warm skin.  Dizziness. Symptoms of moderate dehydration may include:  Very dry mouth.  Muscle cramps.  Dark pee (urine). Pee may be the color of tea.  Your body making less pee.  Your eyes making fewer tears.  Heartbeat that is uneven or faster than normal (palpitations).  Headache.  Light-headedness, especially when you stand up from sitting.  Fainting (syncope). Symptoms of very bad dehydration may include:  Changes in skin, such as: ? Cold and clammy skin. ? Blotchy (mottled) or pale skin. ? Skin that does not quickly return to normal after being lightly pinched and let go (poor skin turgor).  Changes in body fluids, such as: ? Feeling very thirsty. ? Your eyes making fewer tears. ? Not sweating when body temperature is high, such as in hot weather. ? Your body making very little pee.  Changes in vital signs, such as: ? Weak pulse. ? Pulse that is more than 100 beats a minute when you are sitting still. ? Fast breathing. ? Low blood pressure.  Other changes, such as: ? Sunken eyes. ? Cold hands and feet. ? Confusion. ? Lack of energy (lethargy). ? Trouble waking up from sleep. ? Short-term weight loss. ? Unconsciousness. Follow these instructions at home:   If told by your doctor, drink an ORS: ? Make an ORS by using instructions on the package. ? Start by drinking small amounts, about  cup (120 mL) every 5-10 minutes. ? Slowly drink more until you have had the amount that your doctor said to have.  Drink enough clear fluid to keep your pee clear or pale yellow. If you were told to drink an ORS, finish the  ORS first, then start slowly drinking clear fluids. Drink fluids such as: ? Water. Do not drink only water by itself. Doing that can make the salt (sodium) level in your body get too low (hyponatremia). ? Ice chips. ? Fruit juice that you have added water to (diluted). ? Low-calorie sports drinks.  Avoid: ? Alcohol. ? Drinks that have a lot of sugar. These include high-calorie sports drinks, fruit juice that does not have water added, and soda. ? Caffeine. ? Foods that are greasy or have a lot of fat or sugar.  Take over-the-counter and prescription medicines only as told by your doctor.  Do not take salt tablets. Doing that can make the salt level in your body get too high (hypernatremia).  Eat foods that have minerals (electrolytes). Examples include bananas, oranges, potatoes, tomatoes, and spinach.  Keep all follow-up visits as told by your doctor. This is important. Contact a doctor if:  You have belly (abdominal) pain that: ? Gets worse. ? Stays in one area (localizes).  You have a rash.  You have a stiff neck.  You get angry or annoyed more easily than normal (irritability).  You are more sleepy than normal.  You have a harder time waking up than normal.  You feel: ? Weak. ? Dizzy. ? Very thirsty.  You have peed (urinated) only a small amount of very dark pee during 6-8 hours. Get help right away if:  You have   symptoms of very bad dehydration.  You cannot drink fluids without throwing up (vomiting).  Your symptoms get worse with treatment.  You have a fever.  You have a very bad headache.  You are throwing up or having watery poop (diarrhea) and it: ? Gets worse. ? Does not go away.  You have blood or something green (bile) in your throw-up.  You have blood in your poop (stool). This may cause poop to look black and tarry.  You have not peed in 6-8 hours.  You pass out (faint).  Your heart rate when you are sitting still is more than 100 beats a  minute.  You have trouble breathing. This information is not intended to replace advice given to you by your health care provider. Make sure you discuss any questions you have with your health care provider. Document Released: 08/03/2009 Document Revised: 09/19/2017 Document Reviewed: 12/01/2015 Elsevier Patient Education  2020 Elsevier Inc.  

## 2019-10-05 NOTE — Progress Notes (Signed)
Pt's sister, [name], with some concerns about discharge to home setting, medication compliance. Dr Wynetta Emery and Case management made aware of her concerns and are to follow up. Dr Wynetta Emery wanting PT to evaluate pt. PT notified of request.

## 2019-10-05 NOTE — Evaluation (Signed)
Clinical/Bedside Swallow Evaluation Patient Details  Name: PURL CLAYTOR MRN: [id number] Date of Birth: 06/24/1951  Today's Date: 10/05/2019 Time: SLP Start Time (ACUTE ONLY): 1257 SLP Stop Time (ACUTE ONLY): 1317 SLP Time Calculation (min) (ACUTE ONLY): 20 min  Past Medical History:  Past Medical History:  Diagnosis Date  . Cancer ([place])    lung  . COPD (chronic obstructive pulmonary disease) ([place])   . Dementia ([place])   . Dyspnea   . Seizures ([place])    Past Surgical History: History reviewed. No pertinent surgical history. HPI:  Acute COPD Exacerbation- no definite pneumonia,  treated empirically with IV Solu-Medrol 40 mg every 8 hours with good improvement in symptoms.  He was given mucolytics, doxycycline and bronchodilators and supplemental oxygen as ordered.   He will be evaluated for supplemental oxygen need before discharge and will order Home oxygen if qualifies.  He will discharge home on prednisone 40 mg daily x 5 days, doxycycline and albuterol HFA. Marland KitchenBSE requested   Assessment / Plan / Recommendation Clinical Impression  Clinical swallow evaluation completed at bedside. Pt reports that he takes his medications in applesauce, but otherwise denies difficulty swallowing. Oral motor examination is WNL with the exception of white coating all long the tongue. Pt indicates that he does not use a toothbrush due to being edentulous, however he was agreeable to adding this to his oral care routine at home. Pt consumed ice chips, water via cup and straw, and regular textures with no overt signs or symptoms of aspiration. Pt with mildly prolonged oral phase when masticating chicken due to edentulous status. Recommend continue self regulated regular textures and thin liquids with standard aspiration and reflux precautions. No further SLP services indicated at this time.   SLP Visit Diagnosis: Dysphagia, unspecified (R13.10)    Aspiration Risk  No limitations    Diet Recommendation  Regular;Thin liquid   Liquid Administration via: Straw;Cup Medication Administration: Whole meds with puree Supervision: Patient able to self feed Postural Changes: Seated upright at 90 degrees;Remain upright for at least 30 minutes after po intake    Other  Recommendations Oral Care Recommendations: Oral care BID Other Recommendations: Clarify dietary restrictions   Follow up Recommendations None      Frequency and Duration            Prognosis Prognosis for Safe Diet Advancement: Good      Swallow Study   General Date of Onset: 10/04/19 HPI: Acute COPD Exacerbation- no definite pneumonia,  treated empirically with IV Solu-Medrol 40 mg every 8 hours with good improvement in symptoms.  He was given mucolytics, doxycycline and bronchodilators and supplemental oxygen as ordered.   He will be evaluated for supplemental oxygen need before discharge and will order Home oxygen if qualifies.  He will discharge home on prednisone 40 mg daily x 5 days, doxycycline and albuterol HFA. Marland KitchenBSE requested Type of Study: Bedside Swallow Evaluation Previous Swallow Assessment: None on record Diet Prior to this Study: Regular;Thin liquids Temperature Spikes Noted: No Respiratory Status: Room air History of Recent Intubation: No Behavior/Cognition: Alert;Cooperative;Pleasant mood Oral Cavity Assessment: Within Functional Limits(white coating on tongue) Oral Care Completed by SLP: Yes Oral Cavity - Dentition: Edentulous(has dentures at home, but does not wear to eat) Vision: Functional for self-feeding Self-Feeding Abilities: Able to feed self Patient Positioning: Upright in bed Baseline Vocal Quality: Normal Volitional Cough: Strong Volitional Swallow: Able to elicit    Oral/Motor/Sensory Function Overall Oral Motor/Sensory Function: Within functional limits   Ice Chips Ice  chips: Within functional limits Presentation: Spoon   Thin Liquid Thin Liquid: Within functional limits Presentation:  Cup;Self Fed;Straw    Nectar Thick Nectar Thick Liquid: Not tested   Honey Thick Honey Thick Liquid: Not tested   Puree Puree: Within functional limits   Solid     Solid: Impaired Presentation: Spoon Oral Phase Impairments: Impaired mastication(edentulous, prolonged mastication but functional) Oral Phase Functional Implications: Prolonged oral transit     Thank you,  Genene Churn, [place]  Mateo Overbeck 10/05/2019,1:20 PM

## 2019-10-05 NOTE — TOC Progression Note (Signed)
Transition of Care [facility]) - Progression Note    Patient Details  Name: Keith Roberson MRN: [id number] Date of Birth: 09/21/1951  [facility] CM/SW Contact  Boneta Lucks, RN Phone Number: 10/05/2019, 2:26 PM  Clinical Narrative:  Patient admitted in observation for respiratory failure, here  20 hours and ready for discharge.  Sister- [name] is concerned he will not take his medication.  Patient drinks daily, is not interested in SA or rehab. He lives alone, ambulatory. Anna Maria does not think 2 visits a week will benefit.  She feels he needs a long term plan.  States he has Medicaid, it is not listed on his chart.  CM is printing a list of family care, group homes, and ALF for Malin. She will pick him up at discharge and work on long term plan.    Expected Discharge Plan and Services    Expected Discharge Date: 10/05/19                    Social Determinants of Health (SDOH) Interventions    Readmission Risk Interventions No flowsheet data found.

## 2019-11-19 ENCOUNTER — Emergency Department (HOSPITAL_COMMUNITY)
Admission: EM | Admit: 2019-11-19 | Discharge: 2019-11-19 | Disposition: A | Discharge status: Home / Self Care | Payer: Medicare HMO | Attending: Emergency Medicine | Admitting: Emergency Medicine

## 2019-11-19 ENCOUNTER — Emergency Department (HOSPITAL_COMMUNITY): Payer: Medicare HMO

## 2019-11-19 ENCOUNTER — Other Ambulatory Visit: Payer: Self-pay

## 2019-11-19 ENCOUNTER — Encounter (HOSPITAL_COMMUNITY): Payer: Self-pay | Admitting: Emergency Medicine

## 2019-11-19 DIAGNOSIS — F17210 Nicotine dependence, cigarettes, uncomplicated: Secondary | ICD-10-CM | POA: Diagnosis not present

## 2019-11-19 DIAGNOSIS — J449 Chronic obstructive pulmonary disease, unspecified: Secondary | ICD-10-CM | POA: Insufficient documentation

## 2019-11-19 DIAGNOSIS — Z20822 Contact with and (suspected) exposure to covid-19: Secondary | ICD-10-CM | POA: Diagnosis not present

## 2019-11-19 DIAGNOSIS — R0602 Shortness of breath: Secondary | ICD-10-CM | POA: Diagnosis not present

## 2019-11-19 DIAGNOSIS — R059 Cough, unspecified: Secondary | ICD-10-CM

## 2019-11-19 DIAGNOSIS — R05 Cough: Secondary | ICD-10-CM | POA: Diagnosis not present

## 2019-11-19 LAB — CBC WITH DIFFERENTIAL/PLATELET
Abs Immature Granulocytes: 0.02 10*3/uL (ref 0.00–0.07)
Basophils Absolute: 0 10*3/uL (ref 0.0–0.1)
Basophils Relative: 0 %
Eosinophils Absolute: 0.1 10*3/uL (ref 0.0–0.5)
Eosinophils Relative: 2 %
HCT: 35.5 % — ABNORMAL LOW (ref 39.0–52.0)
Hemoglobin: 11.4 g/dL — ABNORMAL LOW (ref 13.0–17.0)
Immature Granulocytes: 0 %
Lymphocytes Relative: 38 %
Lymphs Abs: 2.5 10*3/uL (ref 0.7–4.0)
MCH: 35.4 pg — ABNORMAL HIGH (ref 26.0–34.0)
MCHC: 32.1 g/dL (ref 30.0–36.0)
MCV: 110.2 fL — ABNORMAL HIGH (ref 80.0–100.0)
Monocytes Absolute: 0.5 10*3/uL (ref 0.1–1.0)
Monocytes Relative: 7 %
Neutro Abs: 3.5 10*3/uL (ref 1.7–7.7)
Neutrophils Relative %: 53 %
Platelets: 227 10*3/uL (ref 150–400)
RBC: 3.22 MIL/uL — ABNORMAL LOW (ref 4.22–5.81)
RDW: 16.2 % — ABNORMAL HIGH (ref 11.5–15.5)
WBC: 6.5 10*3/uL (ref 4.0–10.5)
nRBC: 0 % (ref 0.0–0.2)

## 2019-11-19 LAB — BASIC METABOLIC PANEL
Anion gap: 6 (ref 5–15)
BUN: 9 mg/dL (ref 8–23)
CO2: 28 mmol/L (ref 22–32)
Calcium: 8.8 mg/dL — ABNORMAL LOW (ref 8.9–10.3)
Chloride: 106 mmol/L (ref 98–111)
Creatinine, Ser: 0.86 mg/dL (ref 0.61–1.24)
GFR calc Af Amer: >60 mL/min (ref >60)
GFR calc non Af Amer: >60 mL/min (ref >60)
Glucose, Bld: 89 mg/dL (ref 70–99)
Potassium: 5 mmol/L (ref 3.5–5.1)
Sodium: 140 mmol/L (ref 135–145)

## 2019-11-19 LAB — SARS CORONAVIRUS 2 (TAT 6-24 HRS): SARS Coronavirus 2: NEGATIVE

## 2019-11-19 LAB — TROPONIN I (HIGH SENSITIVITY): Troponin I (High Sensitivity): 3 ng/L (ref <18)

## 2019-11-19 NOTE — ED Provider Notes (Signed)
[facility] EMERGENCY DEPARTMENT Provider Note   CSN: [id number] Arrival date & time: 11/19/19  1259   History Chief Complaint  Patient presents with  . Shortness of Breath   Keith Roberson is a 69 y.o. male with past medical history significant for COPD, dementia, dyspnea, seizures use who presents for evaluation of shortness of breath.  Patient states last night he had episode of shortness of breath.  Lasted approximately 5 minutes and self resolved.  He denied any associated chest pain, shortness of breath.  Patient states he has had a mild nonproductive cough.  Patient states he was seen at ED at [facility] did not have his mask on and he was concern for Covid.  Denies fever, chills, nausea, vomiting, chest pain, shortness of breath, neck pain, neck stiffness, hemoptysis, abdominal pain, diarrhea, dysuria, unilateral leg swelling, redness or warmth.  Denies any current symptoms.  Denies additional aggravating or alleviating factors.  History obtained from patient past medical records.  No interpreter is used.  HPI     Past Medical History:  Diagnosis Date  . Cancer ([place])    lung  . COPD (chronic obstructive pulmonary disease) ([place])   . Dementia ([place])   . Dyspnea   . Seizures [facility])     Patient Active Problem List   Diagnosis Date Noted  . Respiratory failure with hypoxia ([place]) 10/04/2019  . COPD with acute exacerbation ([place]) 10/04/2019  . AKI (acute kidney injury) ([place]) 10/04/2019  . Alcohol abuse 10/06/2018  . Alcohol intoxication ([place]) 10/06/2018  . Dementia ([place]) 10/06/2018  . Seizures ([place]) 10/06/2018  . Special screening for malignant neoplasms, colon 10/23/2016    History reviewed. No pertinent surgical history.     History reviewed. No pertinent family history.  Social History   Tobacco Use  . Smoking status: Current Every Day Smoker    Packs/day: 0.50    Types: Cigarettes  . Smokeless tobacco: Never Used  Substance Use Topics  . Alcohol use: Yes   Comment: occ  . Drug use: No    Home Medications Prior to Admission medications   Medication Sig Start Date End Date Taking? Authorizing Provider  albuterol (PROVENTIL) (2.5 MG/3ML) 0.083% nebulizer solution Take 2.5 mg by nebulization every 6 (six) hours as needed for wheezing or shortness of breath.   Yes [provider]  albuterol (VENTOLIN HFA) 108 (90 Base) MCG/ACT inhaler Inhale 2 puffs into the lungs every 4 (four) hours as needed for wheezing or shortness of breath (cough, shortness of breath or wheezing.). 10/05/19  Yes Johnson, Clanford L, MD  amLODipine (NORVASC) 5 MG tablet Take 1 tablet (5 mg total) by mouth daily. Patient not taking: Reported on 11/19/2019 10/06/19   Murlean Iba, MD    Allergies    Patient has no known allergies.  Review of Systems   Review of Systems  Constitutional: Negative.   HENT: Negative.   Respiratory: Positive for cough and shortness of breath (Episodic, non currently). Negative for apnea, choking, chest tightness, wheezing and stridor.   Cardiovascular: Negative.   Gastrointestinal: Negative.   Genitourinary: Negative.   Musculoskeletal: Negative.   Neurological: Negative.   All other systems reviewed and are negative.   Physical Exam Updated Vital Signs BP (!) 163/95   Pulse 65   Temp 97.9 F (36.6 C) (Oral)   Resp (!) 24   Ht 5\' 11"  (1.803 m)   Wt 73.5 kg   SpO2 96%   BMI 22.60 kg/m   Physical Exam  Vitals and nursing note reviewed.  Constitutional:      General: He is not in acute distress.    Appearance: He is not ill-appearing, toxic-appearing or diaphoretic.  HENT:     Head: Normocephalic and atraumatic.     Jaw: There is normal jaw occlusion.     Right Ear: Tympanic membrane, ear canal and external ear normal. There is no impacted cerumen. No hemotympanum. Tympanic membrane is not injected, scarred, perforated, erythematous, retracted or bulging.     Left Ear: Tympanic membrane, ear canal and external  ear normal. There is no impacted cerumen. No hemotympanum. Tympanic membrane is not injected, scarred, perforated, erythematous, retracted or bulging.     Ears:     Comments: No Mastoid tenderness.    Nose:     Comments: Clear rhinorrhea and congestion to bilateral nares.  No sinus tenderness.    Mouth/Throat:     Comments: Posterior oropharynx clear.  Mucous membranes moist.  Tonsils without erythema or exudate.  Uvula midline without deviation.  No evidence of PTA or RPA.  No drooling, dysphasia or trismus.  Phonation normal. Neck:     Trachea: Trachea and phonation normal.     Meningeal: Brudzinski's sign and Kernig's sign absent.     Comments: No Neck stiffness or neck rigidity.  No meningismus.  No cervical lymphadenopathy. Cardiovascular:     Comments: No murmurs rubs or gallops. Pulmonary:     Comments: Clear to auscultation bilaterally without wheeze, rhonchi or rales.  No accessory muscle usage.  Able speak in full sentences. Abdominal:     Comments: Soft, nontender without rebound or guarding.  No CVA tenderness.  Musculoskeletal:     Comments: Moves all 4 extremities without difficulty.  Lower extremities without edema, erythema or warmth.  Skin:    Comments: Brisk capillary refill.  No rashes or lesions.  Neurological:     Mental Status: He is alert.     Comments: Ambulatory in department without difficulty.  Cranial nerves II through XII grossly intact.  No facial droop.  No aphasia.     ED Results / Procedures / Treatments   Labs (all labs ordered are listed, but only abnormal results are displayed) Labs Reviewed  CBC WITH DIFFERENTIAL/PLATELET - Abnormal; Notable for the following components:      Result Value   RBC 3.22 (*)    Hemoglobin 11.4 (*)    HCT 35.5 (*)    MCV 110.2 (*)    MCH 35.4 (*)    RDW 16.2 (*)    All other components within normal limits  BASIC METABOLIC PANEL - Abnormal; Notable for the following components:   Calcium 8.8 (*)    All other  components within normal limits  SARS CORONAVIRUS 2 (TAT 6-24 HRS)  TROPONIN I (HIGH SENSITIVITY)  TROPONIN I (HIGH SENSITIVITY)    EKG None  Radiology DG Chest Portable 1 View  Result Date: 11/19/2019 CLINICAL DATA:  Shortness of breath. EXAM: PORTABLE CHEST 1 VIEW COMPARISON:  Chest x-ray 10/04/2019, 08/07/2019, 03/08/2017. CT 07/27/2019. FINDINGS: Mediastinum and hilar structures normal. Heart size normal. COPD. Chronic interstitial changes are again noted. No acute infiltrate noted. No pleural effusion or pneumothorax. Biapical pleural thickening again noted consistent scarring. No acute bony abnormality identified. IMPRESSION: COPD. Chronic interstitial lung disease and pleuroparenchymal scarring. No acute abnormality identified. Electronically Signed   By: Marcello Moores  Register   On: 11/19/2019 14:40   Procedures Procedures (including critical care time)  Medications Ordered in ED Medications - No  data to display  ED Course  I have reviewed the triage vital signs and the nursing notes.  Pertinent labs & imaging results that were available during my care of the patient were reviewed by me and considered in my medical decision making (see chart for details).  69 year old male appears otherwise well presents for evaluation of episode of shortness of breath which occurred approximately 20 hours PTA.  No associated chest pain, diaphoresis, unilateral leg swelling, redness or warmth.  History of COPD however does not use home oxygen.  Patient states he has not had to use his nebulizers.  Has been admitted previously for COPD exacerbation.  States he has had a mild cough since he was seen at a hospital in [place] last week and he did not have his mask on.  He is concerned for Covid.  Heart and lungs clear.  No neck stiffness or neck rigidity.  No meningismus.  Abdomen soft, nontender.  Neurovascularly intact.  Clinical Course as of Nov 18 1646  Fri Nov 19, 2019  1505 Mild interstitial lung  changes.  No acute abnormality, specifically no cardiomegaly, pulmonary edema, pneumothorax or infiltrates.  DG Chest Portable 1 View [BH]  0932 Not crossing over into computer, no ST/T changes.  ED EKG [BH]  1646 Troponin negative.  Given symptoms occurred greater than 12 hours ago have low suspicion for ACS, PE or dissection.  Troponin I (High Sensitivity) [BH]  1646 No leukocytosis, hemoglobin 11.4.  CBC with Differential(!) [BH]  1646 No additional electrolyte, renal or liver abnormality  Basic metabolic panel(!) [BH]  6712 Pending  SARS CORONAVIRUS 2 (TAT 6-24 HRS) Nasopharyngeal Nasopharyngeal Swab [BH]    Clinical Course User Index [BH] Enrigue Hashimi A, PA-C   1630: Patient reassessed.  Continues to deny any symptoms.  I personally ambulated patient in room without hypoxia, tachycardia shortness of breath dyspnea on exertion.  We will do outpatient Covid test.  Unfortunately patient been seen multiple times for this in the past including here as well as [facility].  Had recent CTA of his chest which did not show any significant findings.  Imaging and labs reassuring here in the emergency department.  Patient is to be discharged with recommendation to follow up with PCP in regards to today's hospital visit. Chest pain is not likely of cardiac or pulmonary etiology d/t presentation, PERC negative, VSS, no tracheal deviation, no JVD or new murmur, RRR, breath sounds equal bilaterally, EKG without acute abnormalities, negative troponin, and negative CXR. Pt has been advised to return to the ED if CP becomes exertional, associated with diaphoresis or nausea, radiates to left jaw/arm, worsens or becomes concerning in any way. Pt appears reliable for follow up and is agreeable to discharge.  Patient to follow-up outpatient with PCP.  I did try to contact his guardian however bring to voicemail and unable to leave message.  The patient has been appropriately medically screened and/or  stabilized in the ED. I have low suspicion for any other emergent medical condition which would require further screening, evaluation or treatment in the ED or require inpatient management.  Patient is hemodynamically stable and in no acute distress.  Patient able to ambulate in department prior to ED.  Evaluation does not show acute pathology that would require ongoing or additional emergent interventions while in the emergency department or further inpatient treatment.  I have discussed the diagnosis with the patient and answered all questions.  Pain is been managed while in the emergency department and  patient has no further complaints prior to discharge.  Patient is comfortable with plan discussed in room and is stable for discharge at this time.  I have discussed strict return precautions for returning to the emergency department.  Patient was encouraged to follow-up with PCP/specialist refer to at discharge.   MDM Rules/Calculators/A&P                      Keith Roberson was evaluated in Emergency Department on 11/19/2019 for the symptoms described in the history of present illness. He was evaluated in the context of the global COVID-19 pandemic, which necessitated consideration that the patient might be at risk for infection with the SARS-CoV-2 virus that causes COVID-19. Institutional protocols and algorithms that pertain to the evaluation of patients at risk for COVID-19 are in a state of rapid change based on information released by regulatory bodies including the CDC and federal and state organizations. These policies and algorithms were followed during the patient's care in the ED. Final Clinical Impression(s) / ED Diagnoses Final diagnoses:  Cough  SOB (shortness of breath)    Rx / DC Orders ED Discharge Orders    None       Woodrow Dulski A, PA-C 11/19/19 1648    Milton Ferguson, MD 11/22/19 [phone]

## 2019-11-19 NOTE — ED Triage Notes (Signed)
Per ems pt call for sob he walked to the truck without problem his stat remained between 98-100 without O2.

## 2019-11-19 NOTE — Discharge Instructions (Addendum)
Return for new or worsening symptoms

## 2020-03-13 ENCOUNTER — Other Ambulatory Visit: Payer: Self-pay

## 2020-03-13 ENCOUNTER — Encounter (HOSPITAL_COMMUNITY): Payer: Self-pay | Admitting: *Deleted

## 2020-03-13 ENCOUNTER — Emergency Department (HOSPITAL_COMMUNITY)
Admission: EM | Admit: 2020-03-13 | Discharge: 2020-03-13 | Disposition: A | Discharge status: Home / Self Care | Payer: Medicare HMO | Attending: Emergency Medicine | Admitting: Emergency Medicine

## 2020-03-13 ENCOUNTER — Emergency Department (HOSPITAL_COMMUNITY): Payer: Medicare HMO

## 2020-03-13 DIAGNOSIS — F0390 Unspecified dementia without behavioral disturbance: Secondary | ICD-10-CM | POA: Diagnosis not present

## 2020-03-13 DIAGNOSIS — Z85118 Personal history of other malignant neoplasm of bronchus and lung: Secondary | ICD-10-CM | POA: Insufficient documentation

## 2020-03-13 DIAGNOSIS — R0602 Shortness of breath: Secondary | ICD-10-CM

## 2020-03-13 DIAGNOSIS — F17210 Nicotine dependence, cigarettes, uncomplicated: Secondary | ICD-10-CM | POA: Diagnosis not present

## 2020-03-13 DIAGNOSIS — J449 Chronic obstructive pulmonary disease, unspecified: Secondary | ICD-10-CM | POA: Diagnosis not present

## 2020-03-13 LAB — CBC WITH DIFFERENTIAL/PLATELET
Abs Immature Granulocytes: 0.02 10*3/uL (ref 0.00–0.07)
Basophils Absolute: 0 10*3/uL (ref 0.0–0.1)
Basophils Relative: 0 %
Eosinophils Absolute: 0 10*3/uL (ref 0.0–0.5)
Eosinophils Relative: 0 %
HCT: 45 % (ref 39.0–52.0)
Hemoglobin: 15 g/dL (ref 13.0–17.0)
Immature Granulocytes: 0 %
Lymphocytes Relative: 27 %
Lymphs Abs: 2.1 10*3/uL (ref 0.7–4.0)
MCH: 35.1 pg — ABNORMAL HIGH (ref 26.0–34.0)
MCHC: 33.3 g/dL (ref 30.0–36.0)
MCV: 105.4 fL — ABNORMAL HIGH (ref 80.0–100.0)
Monocytes Absolute: 0.5 10*3/uL (ref 0.1–1.0)
Monocytes Relative: 6 %
Neutro Abs: 5.2 10*3/uL (ref 1.7–7.7)
Neutrophils Relative %: 67 %
Platelets: 227 10*3/uL (ref 150–400)
RBC: 4.27 MIL/uL (ref 4.22–5.81)
RDW: 17.7 % — ABNORMAL HIGH (ref 11.5–15.5)
WBC: 7.9 10*3/uL (ref 4.0–10.5)
nRBC: 0 % (ref 0.0–0.2)

## 2020-03-13 LAB — BASIC METABOLIC PANEL
Anion gap: 12 (ref 5–15)
BUN: 18 mg/dL (ref 8–23)
CO2: 25 mmol/L (ref 22–32)
Calcium: 9.4 mg/dL (ref 8.9–10.3)
Chloride: 101 mmol/L (ref 98–111)
Creatinine, Ser: 1.19 mg/dL (ref 0.61–1.24)
GFR calc Af Amer: >60 mL/min (ref >60)
GFR calc non Af Amer: >60 mL/min (ref >60)
Glucose, Bld: 94 mg/dL (ref 70–99)
Potassium: 4.6 mmol/L (ref 3.5–5.1)
Sodium: 138 mmol/L (ref 135–145)

## 2020-03-13 MED ORDER — PREDNISONE 10 MG PO TABS: 50.0000 mg | ORAL_TABLET | Freq: Every day | ORAL | 0 refills | Status: AC

## 2020-03-13 MED ORDER — ALBUTEROL SULFATE HFA 108 (90 BASE) MCG/ACT IN AERS
2.0000 | INHALATION_SPRAY | Freq: Once | RESPIRATORY_TRACT | Status: AC
  Administered 2020-03-13: 2 via RESPIRATORY_TRACT
  Filled 2020-03-13: qty 6.7

## 2020-03-13 MED ORDER — PREDNISONE 50 MG PO TABS
60.0000 mg | ORAL_TABLET | Freq: Once | ORAL | Status: AC
  Administered 2020-03-13: 60 mg via ORAL
  Filled 2020-03-13: qty 1

## 2020-03-13 NOTE — ED Provider Notes (Signed)
[facility] Provider Note   CSN: [id number] Arrival date & time: 03/13/20  1403     History Chief Complaint  Patient presents with  . Shortness of Breath    Keith Roberson is a 69 y.o. male with history of COPD, dementia, alcohol abuse presents for evaluation of acute onset, persistent shortness of breath since yesterday.  Reports dyspnea on exertion, denies orthopnea or PND or leg swelling.  No recent travel or surgeries, no prior history of DVT or PE.  He has not tried anything for his symptoms.  He currently smokes approximately half a pack of cigarettes daily.  He has not tried anything for his symptoms.  He reports he does not have a rescue inhaler or any COPD medicines at home.  Denies fevers, cough, abdominal pain, nausea, vomiting, chest pain or headache.   The history is provided by the patient.       Past Medical History:  Diagnosis Date  . Cancer ([place])    lung  . COPD (chronic obstructive pulmonary disease) ([place])   . Dementia ([place])   . Dyspnea   . Seizures [facility])     Patient Active Problem List   Diagnosis Date Noted  . Respiratory failure with hypoxia ([place]) 10/04/2019  . COPD with acute exacerbation ([place]) 10/04/2019  . AKI (acute kidney injury) ([place]) 10/04/2019  . Alcohol abuse 10/06/2018  . Alcohol intoxication ([place]) 10/06/2018  . Dementia ([place]) 10/06/2018  . Seizures ([place]) 10/06/2018  . Special screening for malignant neoplasms, colon 10/23/2016    History reviewed. No pertinent surgical history.     History reviewed. No pertinent family history.  Social History   Tobacco Use  . Smoking status: Current Every Day Smoker    Packs/day: 0.50    Types: Cigarettes  . Smokeless tobacco: Never Used  Substance Use Topics  . Alcohol use: Yes    Comment: occ  . Drug use: No    Home Medications Prior to Admission medications   Medication Sig Start Date End Date Taking? Authorizing Provider  albuterol (PROVENTIL) (2.5 MG/3ML)  0.083% nebulizer solution Take 2.5 mg by nebulization every 6 (six) hours as needed for wheezing or shortness of breath.    [provider]  albuterol (VENTOLIN HFA) 108 (90 Base) MCG/ACT inhaler Inhale 2 puffs into the lungs every 4 (four) hours as needed for wheezing or shortness of breath (cough, shortness of breath or wheezing.). 10/05/19   Johnson, Clanford L, MD  amLODipine (NORVASC) 5 MG tablet Take 1 tablet (5 mg total) by mouth daily. Patient not taking: Reported on 11/19/2019 10/06/19   Murlean Iba, MD  predniSONE (DELTASONE) 10 MG tablet Take 5 tablets (50 mg total) by mouth daily with breakfast for 5 days. 03/13/20 03/18/20  Rodell Perna A, PA-C    Allergies    Patient has no known allergies.  Review of Systems   Review of Systems  Constitutional: Negative for chills and fever.  Respiratory: Positive for shortness of breath. Negative for cough.   Cardiovascular: Negative for chest pain and leg swelling.  Gastrointestinal: Negative for abdominal pain, nausea and vomiting.  All other systems reviewed and are negative.   Physical Exam Updated Vital Signs BP (!) 162/93   Pulse 86   Temp 98.6 F (37 C) (Oral)   Resp 18   Ht 5' 11.5" (1.816 m)   Wt 73.5 kg   SpO2 99%   BMI 22.28 kg/m   Physical Exam Vitals and nursing note reviewed.  Constitutional:      General: He is not in acute distress.    Appearance: He is well-developed.     Comments: Resting comfortably in bed  HENT:     Head: Normocephalic and atraumatic.  Eyes:     General:        Right eye: No discharge.        Left eye: No discharge.     Conjunctiva/sclera: Conjunctivae normal.  Neck:     Vascular: No JVD.     Trachea: No tracheal deviation.  Cardiovascular:     Rate and Rhythm: Normal rate and regular rhythm.     Comments: 2+ radial and DP/PT pulses bilaterally, Homans sign absent bilaterally, no lower extremity edema, no palpable cords, compartments are soft  Pulmonary:     Effort:  Pulmonary effort is normal.     Comments: Soft scattered expiratory wheezes.  Speaking in full sentences without difficulty, SPO2 saturations 100% on room air. Chest:     Chest wall: No tenderness.  Abdominal:     General: There is no distension.     Palpations: Abdomen is soft.     Tenderness: There is no abdominal tenderness. There is no guarding or rebound.  Musculoskeletal:     Right lower leg: No tenderness. No edema.     Left lower leg: No tenderness. No edema.  Skin:    General: Skin is warm and dry.     Findings: No erythema.  Neurological:     Mental Status: He is alert.  Psychiatric:        Behavior: Behavior normal.     ED Results / Procedures / Treatments   Labs (all labs ordered are listed, but only abnormal results are displayed) Labs Reviewed  CBC WITH DIFFERENTIAL/PLATELET - Abnormal; Notable for the following components:      Result Value   MCV 105.4 (*)    MCH 35.1 (*)    RDW 17.7 (*)    All other components within normal limits  BASIC METABOLIC PANEL    EKG EKG Interpretation  Date/Time:  Monday Mar 13 2020 14:13:31 EDT Ventricular Rate:  84 PR Interval:    QRS Duration: 78 QT Interval:  373 QTC Calculation: 441 R Axis:   76 Text Interpretation: Sinus rhythm since last tracing no significant change Confirmed by Daleen Bo [phone]) on 03/13/2020 2:17:43 PM   Radiology DG Chest Portable 1 View  Result Date: 03/13/2020 CLINICAL DATA:  Shortness of breath, COPD EXAM: PORTABLE CHEST 1 VIEW COMPARISON:  11/19/2019 FINDINGS: Background changes of COPD. No new consolidation or edema. Stable cardiomediastinal silhouette with normal heart size. No pleural effusion or pneumothorax. IMPRESSION: No acute process in the chest. Electronically Signed   By: Macy Mis M.D.   On: 03/13/2020 14:34    Procedures Procedures (including critical care time)  Medications Ordered in ED Medications  albuterol (VENTOLIN HFA) 108 (90 Base) MCG/ACT inhaler 2 puff  (2 puffs Inhalation Given 03/13/20 1502)  predniSONE (DELTASONE) tablet 60 mg (60 mg Oral Given 03/13/20 1617)    ED Course  I have reviewed the triage vital signs and the nursing notes.  Pertinent labs & imaging results that were available during my care of the patient were reviewed by me and considered in my medical decision making (see chart for details).    MDM Rules/Calculators/A&P                      Patient presenting for evaluation of shortness  of breath since yesterday.  He is afebrile, mildly hypertensive but vital signs otherwise stable.  He is resting comfortably on initial assessment in no apparent distress, speaking in full sentences without difficulty with no increased work of breathing.  On auscultation of the lungs the patient exhibits some scattered wheezes and rhonchi.  EKG shows no acute ischemic abnormalities and in the absence of chest pain I doubt ACS/MI.  Chest x-ray shows no acute cardiopulmonary abnormalities with no evidence of cardiomegaly, edema, consolidation, or pneumothorax.  The patient was ambulated in the ED with stable SPO2 saturations remained at 100% on room air.  In the absence of tachycardia, tachypnea, or hypoxia I doubt PE.  Low suspicion of cardiac tamponade, dissection, esophageal rupture.  Lab work reviewed and interpreted by myself shows no leukocytosis, no anemia, no metabolic derangements, no renal insufficiency.  At this time I suspect he likely has a mild COPD exacerbation but in the absence of any respiratory distress or hypoxia or chest x-ray findings a think this would be reasonable to manage outpatient.  We will refill his albuterol inhaler and discharged with a course of prednisone.  No evidence of pneumonia at this time.  Recommend follow-up with PCP for reevaluation of symptoms.  Discussed strict ED return precautions.  Patient is hemodynamically stable for discharge home at this time.  Patient was seen and evaluated by Dr. Eulis Foster who agrees with  assessment and plan at this time.   Final Clinical Impression(s) / ED Diagnoses Final diagnoses:  Shortness of breath    Rx / DC Orders ED Discharge Orders         Ordered    predniSONE (DELTASONE) 10 MG tablet  Daily with breakfast     03/13/20 1634           Renita Papa, PA-C 03/13/20 1650    Daleen Bo, MD 03/14/20 1146

## 2020-03-13 NOTE — Discharge Instructions (Signed)
Start taking prednisone as prescribed beginning tomorrow.  You received the first dose in the emergency department today.  Use the albuterol inhaler 1 to 2 puffs every 4-6 hours as needed for shortness of breath.  Your work-up today was reassuring with no signs of pneumonia.   Please follow-up with your primary care provider for reevaluation of your shortness of breath and medication management.  Return to the emergency department if any concerning signs or symptoms develop such as severe shortness of breath, chest pains, loss of consciousness, vomiting.

## 2020-03-13 NOTE — ED Triage Notes (Signed)
Pt with sob for past couple of days.  Denies wearing O2 at home.  cbg 106 per EMS 99.2   Pt denies any pain

## 2020-03-13 NOTE — ED Provider Notes (Signed)
  Face-to-face evaluation   History: He presents for evaluation of cough with some chest discomfort for couple days.  He is a smoker.  He is coughing and producing white sputum.  He denies fever, chills, vomiting or dizziness.  Physical exam: Awake, alert cooperative.  Lungs with somewhat decreased air movement and few scattered rhonchi and wheezes.  There is no increased work of breathing.  Medical screening examination/treatment/procedure(s) were conducted as a shared visit with non-physician practitioner(s) and myself.  I personally evaluated the patient during the encounter    Daleen Bo, MD 03/14/20 1146

## 2020-04-17 ENCOUNTER — Emergency Department (HOSPITAL_COMMUNITY): Payer: Medicare HMO

## 2020-04-17 ENCOUNTER — Encounter (HOSPITAL_COMMUNITY): Payer: Self-pay | Admitting: *Deleted

## 2020-04-17 ENCOUNTER — Other Ambulatory Visit: Payer: Self-pay

## 2020-04-17 ENCOUNTER — Emergency Department (HOSPITAL_COMMUNITY)
Admission: EM | Admit: 2020-04-17 | Discharge: 2020-04-17 | Disposition: A | Discharge status: Home / Self Care | Payer: Medicare HMO | Attending: Emergency Medicine | Admitting: Emergency Medicine

## 2020-04-17 DIAGNOSIS — R0602 Shortness of breath: Secondary | ICD-10-CM | POA: Diagnosis present

## 2020-04-17 DIAGNOSIS — C099 Malignant neoplasm of tonsil, unspecified: Secondary | ICD-10-CM | POA: Diagnosis not present

## 2020-04-17 DIAGNOSIS — F17210 Nicotine dependence, cigarettes, uncomplicated: Secondary | ICD-10-CM | POA: Insufficient documentation

## 2020-04-17 DIAGNOSIS — F0390 Unspecified dementia without behavioral disturbance: Secondary | ICD-10-CM | POA: Insufficient documentation

## 2020-04-17 DIAGNOSIS — J449 Chronic obstructive pulmonary disease, unspecified: Secondary | ICD-10-CM | POA: Insufficient documentation

## 2020-04-17 LAB — BASIC METABOLIC PANEL
Anion gap: 8 (ref 5–15)
BUN: 10 mg/dL (ref 8–23)
CO2: 27 mmol/L (ref 22–32)
Calcium: 8.7 mg/dL — ABNORMAL LOW (ref 8.9–10.3)
Chloride: 101 mmol/L (ref 98–111)
Creatinine, Ser: 1.06 mg/dL (ref 0.61–1.24)
GFR calc Af Amer: >60 mL/min (ref >60)
GFR calc non Af Amer: >60 mL/min (ref >60)
Glucose, Bld: 94 mg/dL (ref 70–99)
Potassium: 4.1 mmol/L (ref 3.5–5.1)
Sodium: 136 mmol/L (ref 135–145)

## 2020-04-17 LAB — CBC
HCT: 35.6 % — ABNORMAL LOW (ref 39.0–52.0)
Hemoglobin: 11.7 g/dL — ABNORMAL LOW (ref 13.0–17.0)
MCH: 35.9 pg — ABNORMAL HIGH (ref 26.0–34.0)
MCHC: 32.9 g/dL (ref 30.0–36.0)
MCV: 109.2 fL — ABNORMAL HIGH (ref 80.0–100.0)
Platelets: 196 10*3/uL (ref 150–400)
RBC: 3.26 MIL/uL — ABNORMAL LOW (ref 4.22–5.81)
RDW: 15.9 % — ABNORMAL HIGH (ref 11.5–15.5)
WBC: 5.9 10*3/uL (ref 4.0–10.5)
nRBC: 0 % (ref 0.0–0.2)

## 2020-04-17 MED ORDER — IOHEXOL 300 MG/ML  SOLN
75.0000 mL | Freq: Once | INTRAMUSCULAR | Status: AC | PRN
  Administered 2020-04-17: 75 mL via INTRAVENOUS

## 2020-04-17 NOTE — ED Provider Notes (Signed)
[facility] EMERGENCY DEPARTMENT Provider Note   CSN: [id number] Arrival date & time: 04/17/20  1533     History Chief Complaint  Patient presents with  . Shortness of Breath    Keith Roberson is a 69 y.o. male.  HPI   This patient has a known history of lung cancer, COPD, he has some dementia and a history of dyspnea.  Reportedly the patient does have alcohol abuse as well.  He states that he smokes 1 pack of cigarettes over several days, uses intermittent albuterol and is supposed to take amlodipine for his blood pressure.  He states that he is compliant with his medications.  He reports that this morning he felt some brief shortness of breath, he is not feeling that shortness of breath at this time but states that it got really bad for a while then got better.  He does not use oxygen at home.  He does take intermittent MDI treatments.  He also reports that on the right side of his neck he has developed a mass over the last week.  He states it is not tender, does not affect his swallowing, he has not been nauseated vomiting or febrile.  This right neck mass has gradually grown larger and he is concerned about it.  Review of the medical record shows that the patient was seen for shortness of breath in May as well as a cough in January and had some visits for shortness of breath last year as well.  The patient denies any swelling of his legs and has not short of breath at this time  Past Medical History:  Diagnosis Date  . Cancer ([place])    lung  . COPD (chronic obstructive pulmonary disease) ([place])   . Dementia ([place])   . Dyspnea   . Seizures [facility])     Patient Active Problem List   Diagnosis Date Noted  . Respiratory failure with hypoxia ([place]) 10/04/2019  . COPD with acute exacerbation ([place]) 10/04/2019  . AKI (acute kidney injury) ([place]) 10/04/2019  . Alcohol abuse 10/06/2018  . Alcohol intoxication ([place]) 10/06/2018  . Dementia ([place]) 10/06/2018  . Seizures ([place]) 10/06/2018  .  Special screening for malignant neoplasms, colon 10/23/2016    History reviewed. No pertinent surgical history.     History reviewed. No pertinent family history.  Social History   Tobacco Use  . Smoking status: Current Every Day Smoker    Packs/day: 0.50    Types: Cigarettes  . Smokeless tobacco: Never Used  Vaping Use  . Vaping Use: Never used  Substance Use Topics  . Alcohol use: Yes    Comment: occ  . Drug use: No    Home Medications Prior to Admission medications   Not on File    Allergies    Patient has no known allergies.  Review of Systems   Review of Systems  All other systems reviewed and are negative.   Physical Exam Updated Vital Signs BP (!) 144/88   Pulse 70   Temp 98.5 F (36.9 C) (Oral)   Resp 20   Ht 1.816 m (5' 11.5")   Wt 73.5 kg   SpO2 100%   BMI 22.28 kg/m   Physical Exam Vitals and nursing note reviewed.  Constitutional:      General: He is not in acute distress.    Appearance: He is well-developed.  HENT:     Head: Normocephalic and atraumatic.     Mouth/Throat:     Pharynx: No  oropharyngeal exudate.  Eyes:     General: No scleral icterus.       Right eye: No discharge.        Left eye: No discharge.     Conjunctiva/sclera: Conjunctivae normal.     Pupils: Pupils are equal, round, and reactive to light.  Neck:     Thyroid: No thyromegaly.     Vascular: No JVD.     Comments: Right lateral neck with a several centimeter in size hard lump, mobile, no overlying redness or tenderness, normal-appearing carotid pulses, no bruit, very supple neck, no trismus or torticollis Cardiovascular:     Rate and Rhythm: Normal rate and regular rhythm.     Heart sounds: Normal heart sounds. No murmur heard.  No friction rub. No gallop.   Pulmonary:     Effort: Pulmonary effort is normal. No respiratory distress.     Breath sounds: Normal breath sounds. No wheezing or rales.  Abdominal:     General: Bowel sounds are normal. There is no  distension.     Palpations: Abdomen is soft. There is no mass.     Tenderness: There is no abdominal tenderness.  Musculoskeletal:        General: No tenderness. Normal range of motion.     Cervical back: Normal range of motion and neck supple.  Lymphadenopathy:     Cervical: No cervical adenopathy.  Skin:    General: Skin is warm and dry.     Findings: No erythema or rash.  Neurological:     Mental Status: He is alert.     Coordination: Coordination normal.  Psychiatric:        Behavior: Behavior normal.     ED Results / Procedures / Treatments   Labs (all labs ordered are listed, but only abnormal results are displayed) Labs Reviewed  CBC - Abnormal; Notable for the following components:      Result Value   RBC 3.26 (*)    Hemoglobin 11.7 (*)    HCT 35.6 (*)    MCV 109.2 (*)    MCH 35.9 (*)    RDW 15.9 (*)    All other components within normal limits  BASIC METABOLIC PANEL - Abnormal; Notable for the following components:   Calcium 8.7 (*)    All other components within normal limits    EKG EKG Interpretation  Date/Time:  Monday April 17 2020 16:22:03 EDT Ventricular Rate:  67 PR Interval:    QRS Duration: 87 QT Interval:  397 QTC Calculation: 420 R Axis:   70 Text Interpretation: Incomplete analysis due to missing data in precordial lead(s) Sinus rhythm ST elevation, consider inferior injury Missing lead(s): V2 Imcomplete information Void EKG Confirmed by Noemi Chapel [phone]) on 04/17/2020 4:28:44 PM   Radiology CT Soft Tissue Neck W Contrast  Result Date: 04/17/2020 CLINICAL DATA:  Right neck mass EXAM: CT NECK WITH CONTRAST TECHNIQUE: Multidetector CT imaging of the neck was performed using the standard protocol following the bolus administration of intravenous contrast. CONTRAST:  73mL OMNIPAQUE IOHEXOL 300 MG/ML  SOLN COMPARISON:  None. FINDINGS: Pharynx and larynx: Probable 2.2 cm right tonsillar mass. Salivary glands: Parotid and submandibular glands are  normal. Thyroid: Normal Lymph nodes: Irregular right level 2 nodal mass extending over a length of 5.4 cm with a diameter of 3.7 cm. Compression or possible invasion of the right jugular vein. Indistinct separation from the under surface of the sternocleidomastoid muscle. Vascular: Ordinary atherosclerotic calcification at the carotid bifurcations. Limited intracranial:  Normal except for age related volume loss. Visualized orbits: Normal Mastoids and visualized paranasal sinuses: Clear Skeleton: Chronic appearing loss of height at C7, T1, T2, T3 and T4. No destructive lesion. 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 Electronically Signed   By: Nelson Chimes M.D.   On: 04/17/2020 18:40   DG Chest Port 1 View  Result Date: 04/17/2020 CLINICAL DATA:  Shortness of breath EXAM: PORTABLE CHEST 1 VIEW COMPARISON:  03/13/2020 FINDINGS: Heart and mediastinal contours are within normal limits. No focal opacities or effusions. No acute bony abnormality. IMPRESSION: No active disease. Electronically Signed   By: Rolm Baptise M.D.   On: 04/17/2020 17:10    Procedures Procedures (including critical care time)  Medications Ordered in ED Medications  iohexol (OMNIPAQUE) 300 MG/ML solution 75 mL (75 mLs Intravenous Contrast Given 04/17/20 1816)    ED Course  I have reviewed the triage vital signs and the nursing notes.  Pertinent labs & imaging results that were available during my care of the patient were reviewed by me and considered in my medical decision making (see chart for details).    MDM Rules/Calculators/A&P                          The patient's lungs are clear, his oxygen is 100% with 20 respirations a pulse of 78 temperature of 98.5 and a blood pressure of 144/88.  His EKG is also normal, he has no signs of ST elevation or depression to be concerned for acute ischemic injury, there is no  arrhythmia, there is no signs of significant left ventricular hypertrophy either.  At this time I will get a chest x-ray to evaluate his lungs as well as a CT scan of the neck to make sure this is not a metastatic tumor of some kind.  The medical record was reviewed extensively, the patient last had a CT scan in October 2020, this was of his lungs which showed no signs of pulmonary embolism, there was emphysematous changes with chronic scarring but no acute pulmonary findings or no worrisome pulmonary lesions.  It is not clear regarding the patient's oncologic history, there is no notes in the medical record to suggest that he has had lung cancer.  The patient is unable to give me a very clear history on the subject either  D/w Dr. Marin Olp - agrees with outpatient f/u. Pt informed of dx.  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

## 2020-04-17 NOTE — ED Triage Notes (Signed)
Pt c/o sob that got worse this am

## 2020-04-17 NOTE — Discharge Instructions (Signed)
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  This is spread to your neck which is why you have the lump on your neck.  I spoke with the cancer doctor who would like for you to be seen within the next week or so.  Please call the number above to make this appointment, seek medical exam immediately for severe or worsening difficulty breathing or swallowing

## 2020-04-21 ENCOUNTER — Inpatient Hospital Stay (HOSPITAL_COMMUNITY): Payer: Medicare HMO | Attending: Oncology | Admitting: Oncology

## 2020-04-21 ENCOUNTER — Encounter (HOSPITAL_COMMUNITY): Payer: Self-pay | Admitting: Oncology

## 2020-04-21 VITALS — BP 127/77 | HR 83 | Temp 97.3°F | Resp 18 | Ht 71.5 in | Wt 146.6 lb

## 2020-04-21 DIAGNOSIS — D539 Nutritional anemia, unspecified: Secondary | ICD-10-CM | POA: Diagnosis not present

## 2020-04-21 DIAGNOSIS — G40909 Epilepsy, unspecified, not intractable, without status epilepticus: Secondary | ICD-10-CM | POA: Diagnosis not present

## 2020-04-21 DIAGNOSIS — R221 Localized swelling, mass and lump, neck: Secondary | ICD-10-CM | POA: Diagnosis not present

## 2020-04-21 DIAGNOSIS — F1027 Alcohol dependence with alcohol-induced persisting dementia: Secondary | ICD-10-CM

## 2020-04-21 DIAGNOSIS — N179 Acute kidney failure, unspecified: Secondary | ICD-10-CM | POA: Diagnosis not present

## 2020-04-21 DIAGNOSIS — J441 Chronic obstructive pulmonary disease with (acute) exacerbation: Secondary | ICD-10-CM | POA: Diagnosis not present

## 2020-04-21 DIAGNOSIS — F0390 Unspecified dementia without behavioral disturbance: Secondary | ICD-10-CM | POA: Diagnosis not present

## 2020-04-21 DIAGNOSIS — Z79899 Other long term (current) drug therapy: Secondary | ICD-10-CM | POA: Insufficient documentation

## 2020-04-21 DIAGNOSIS — F10920 Alcohol use, unspecified with intoxication, uncomplicated: Secondary | ICD-10-CM | POA: Diagnosis not present

## 2020-04-21 DIAGNOSIS — F17210 Nicotine dependence, cigarettes, uncomplicated: Secondary | ICD-10-CM | POA: Insufficient documentation

## 2020-04-21 DIAGNOSIS — R569 Unspecified convulsions: Secondary | ICD-10-CM

## 2020-04-21 HISTORY — DX: Localized swelling, mass and lump, neck: R22.1

## 2020-04-21 NOTE — Patient Instructions (Addendum)
[place] at [facility] Discharge Instructions  You were seen today by Kirby Crigler, PA.  He talked with you about your medical history and what brought you to see [place] today.  He discussed with you that the CT scan you had last week in the ER showed something concerning for cancer in your tonsil and neck.  He talked with you about the next steps and discussed with you that you are going to have to stop drinking.    We would propose for you to have a PET scan to look from the base of the skull to the middle of thigh to get an idea of what is going on in the body. We would also refer him to ENT physician to do a biopsy. This would require sedation to get the biopsy.  After we get a biopsy we can move forward with treatment.    If we do not pursue any treatments at this time we can refer him to hospice.  We feel like that if he does not go forward with treatment it is reasonable to believe that he will pass away from his cancer within 6 months.  If he does not get any treatment for the cancer, his cancer will continue to grow and it will become difficult for him to [place].    The alcohol isn't making his cancer worse, but it does make him difficult to treat.  You asked about him having breathing issues.  We discussed with you that if he enrolls in Hospice, you can call them and they can help with any symptoms that he may be having.    If you can not get in touch with the hospice company that was coming out previously, please call us and we will be glad to send another referral.      Thank you for choosing [place] at [facility] to provide your oncology and hematology care.  To afford each patient quality time with our provider, please arrive at least 15 minutes before your scheduled appointment time.   If you have a lab appointment with the [place] please come in thru the Main Entrance and check in at the main information desk.  You need to  re-schedule your appointment should you arrive 10 or more minutes late.  We strive to give you quality time with our providers, and arriving late affects you and other patients whose appointments are after yours.  Also, if you no show three or more times for appointments you may be dismissed from the clinic at the providers discretion.     Again, thank you for choosing [facility].  Our hope is that these requests will decrease the amount of time that you wait before being seen by our physicians.       _____________________________________________________________  Should you have questions after your visit to [facility], please contact our office at [phone] between the hours of 8:00 a.m. and 4:30 p.m.  Voicemails left after 4:00 p.m. will not be returned until the following business day.  For prescription refill requests, have your pharmacy contact our office and allow 72 hours.    Due to Covid, you will need to wear a mask upon entering the hospital. If you do not have a mask, a mask will be given to you at the Main Entrance upon arrival. For doctor visits, patients may have 1 support person with them. For treatment visits, patients  can not have anyone with them due to social distancing guidelines and our immunocompromised population.

## 2020-04-21 NOTE — Progress Notes (Addendum)
[facility] Hematology/Oncology Consultation   Name: Keith Roberson      MRN: [id number]    Location: Room/bed info not found  Date: 04/21/2020 Time:3:20 PM   REFERRING PHYSICIAN:  ED  REASON FOR CONSULT:  Neck mass, right   DIAGNOSIS:  2.2 cm right tonsillar mass with a right cervical lymph node measuring 5.4 x 3.7 cm with compression and/or invasion of right jugular vein and indistinct separation from the surface of sternocleidomastoid muscle.  HISTORY OF PRESENT ILLNESS:   Keith Roberson is a 69 y.o. 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  He is accompanied by his lovely sister today and discussion was significantly hindered by the patient's uncooperativeness and anger.  He is difficult to understand during discussion and he has been drinking EtOH today and appears to be intoxicated.  His sister provides a 21 of history.  Apparently, he was seen at [facility] in 2019 for the same problem and underwent a biopsy confirming malignancy.  It is reported that he never returned for follow-up.  HOWEVER, there is no record of any of this in his EMR or in [place].  Nevertheless, his sister seems to be a great and reliable historian.  During discussion, the patient called me a "fucking jack ass" and "dumb as shit."  On multiple occassions he angrily said "shut the fuck up."  He wanted to know, "what's this thing on my neck" and despite my explaining, I do not think he fully understands the gravity of the situation.  I asked him to be respectful as I am respectful to him, but he laughed and responded with "shut the fuck up."  I explained the typically fashion for work-up of this issue, but it would require him to abstain from EtOH.  He sneered and said, "case closed" implying that  he was noted going to quit drinking and then responded, "let's get out of here."  Review of Systems - History obtained from sister  PAST MEDICAL HISTORY:   Past Medical History:  Diagnosis Date   Cancer ([place])    lung   COPD (chronic obstructive pulmonary disease) ([place])    Dementia ([place])    Dyspnea    Neck mass 04/21/2020   Seizures ([place])     ALLERGIES: No Known Allergies    MEDICATIONS: I have reviewed the patient's current medications.    Current Outpatient Medications on File Prior to Visit  Medication Sig Dispense Refill   famotidine (PEPCID) 20 MG tablet Take 20 mg by mouth 2 (two) times daily.     senna (SENOKOT) 8.6 MG TABS tablet Take 1 tablet by mouth daily.     albuterol (VENTOLIN HFA) 108 (90 Base) MCG/ACT inhaler Inhale 2 puffs into the lungs every 6 (six) hours as needed for wheezing or shortness of breath. (Patient not taking: Reported on 04/21/2020)     HYDROcodone-acetaminophen (NORCO/VICODIN) 5-325 MG tablet Take 1 tablet by mouth every 6 (six) hours as needed for moderate pain. (Patient not taking: Reported on 04/21/2020)     ondansetron (ZOFRAN-ODT) 4 MG disintegrating tablet Take 4 mg by mouth every 8 (eight) hours as needed for nausea or vomiting. (Patient not taking: Reported on 04/21/2020)     No current facility-administered medications on file prior to visit.  PAST SURGICAL HISTORY History reviewed. No pertinent surgical history.  FAMILY HISTORY: Family History  Problem Relation Age of Onset   Breast cancer Mother    Prostate cancer Father     SOCIAL HISTORY:  reports that he has been smoking cigarettes. He has been smoking about 0.50 packs per day. He has never used smokeless tobacco. He reports current alcohol use. He reports that he does not use drugs.  Social History   Socioeconomic History   Marital status: Divorced    Spouse name: Not on file   Number of children: 1   Years of education: Not on file   Highest education level: Not on file    Occupational History   Occupation: DISABLED  Tobacco Use   Smoking status: Current Every Day Smoker    Packs/day: 0.50    Types: Cigarettes   Smokeless tobacco: Never Used  Vaping Use   Vaping Use: Never used  Substance and Sexual Activity   Alcohol use: Yes    Comment: occ   Drug use: No   Sexual activity: Not Currently  Other Topics Concern   Not on file  Social History Narrative   Not on file   Social Determinants of Health   Financial Resource Strain: Low Risk    Difficulty of Paying Living Expenses: Not hard at all  Food Insecurity: No Food Insecurity   Worried About Charity fundraiser in the Last Year: Never true   Arboriculturist in the Last Year: Never true  Transportation Needs: Public librarian (Medical): Yes   Lack of Transportation (Non-Medical): Yes  Physical Activity: Inactive   Days of Exercise per Week: 0 days   Minutes of Exercise per Session: 0 min  Stress: No Stress Concern Present   Feeling of Stress : Not at all  Social Connections: Socially Isolated   Frequency of Communication with Friends and Family: Once a week   Frequency of Social Gatherings with Friends and Family: Once a week   Attends Religious Services: More than 4 times per year   Active Member of [employer] or Organizations: No   Attends Music therapist: Never   Marital Status: Divorced    PERFORMANCE STATUS: The patient's performance status is 3 - Symptomatic, >50% confined to bed  PHYSICAL EXAM: Most Recent Vital Signs: Blood pressure 127/77, pulse 83, temperature (!) 97.3 F (36.3 C), temperature source Temporal, resp. rate 18, height 5' 11.5" (1.816 m), weight 146 lb 9.6 oz (66.5 kg), SpO2 98 %. BP 127/77 (BP Location: Left Arm, Patient Position: Sitting)   Pulse 83   Temp (!) 97.3 F (36.3 C) (Temporal)   Resp 18   Ht 5' 11.5" (1.816 m)   Wt 146 lb 9.6 oz (66.5 kg)   SpO2 98%   BMI 20.16 kg/m   General Appearance:    Alert,  uncooperative, no distress, appears stated age, accompanied by sister, in wheelchair, disrespectful with inappropriate comments and language.  He is difficult to understand throughout the majority of discussion today.  Head:    Normocephalic, without obvious abnormality, atraumatic  Eyes:    OM's intact, benign, both eyes       Ears:    Not examined  Nose:   Not examined  Throat:   Not examined, mask in place  Neck:   Large right cervical neck mass, hard, fixed.  No erythema.  Back:     Symmetric, no curvature, ROM normal, no CVA  tenderness  Lungs:     Clear to auscultation bilaterally, respirations unlabored.  Decreased breath sounds bilaterally.  Chest wall:    No tenderness or deformity  Heart:    Regular rate and rhythm, S1 and S2 normal, no murmur, rub   or gallop  Abdomen:     Soft, non-tender, bowel sounds active all four quadrants,    no masses  Genitalia:    Not examined  Rectal:    Not examined  Extremities:   Extremities normal, atraumatic, no cyanosis or edema  Pulses:   Not examined  Skin:   Skin color, texture, turgor normal, no rashes or lesions  Lymph nodes:   Cervical, supraclavicular, and axillary nodes normal  Neurologic:   CNII-XII intact. Normal strength, sensation and reflexes      throughout    LABORATORY DATA:  No results found for this or any previous visit (from the past 48 hour(s)).    RADIOGRAPHY: No results found.     PATHOLOGY:  N/A  ASSESSMENT/PLAN:   1. Neck mass He presented to the emergency department on 04/17/2020 with shortness of breath and identified to have symptoms on examination.  This led to CT imaging of soft tissue neck on 04/09/2020 20 2.2 cm right tonsillar abscess with metastatic adenopathy on the right level 2 measuring up to 5.4 cm in size.  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  He like to consume [employer].  He is not interested  in abstaining.  I explained to the patient and his sister the typical approach to data collection including a staging PET and ENT evaluate and biopsy to determine source of malignancy and type of cancer, which would guide treatment recommendations.  He is intoxicated today and at times inappropriate with comments and behavioral disturbances.  His sister apologies multiple times on his behalf.  No matter what diagnosis or stage, he will be very difficult to treat. I do not believe he would be compliant with treatment and recommendations.    He is not willing to pursue additional testing or treatment as his priority is to consume EtOH.  He is not interested in EtOH cessation.    I explained that without treatment his tumor will erode through the skin and has a high likelihood of invading into the jugular vein causing significant bleeding and possible exsanguination.    I have recommended Hospice.  His sister reports that they had Hospice in the past but they discharged him due to lack of decline.  She will reach out to them and re-enroll.  He already has dysphagia and he is eating soft foods to help with this.  As time passes, his dysphagia will worsen and he may auto-cease from EtOHism.  By then, it will likely be too late to intervene.  He would be best served with hospice intervention.  We had a long conversation regarding Hospice.  Patient education was given regarding Hospice and the services they provide.  The patient understands that studies report that early enrollment in Hospice actually allows the patient to live longer compared to those who enroll in Hospice nearer to end of life.  Hospice will allow the patient to stay at home at end of life or go to a facility for end of life care.  At this point, the patient would like to remain at home.  Hospice provides the patient with a team of providers to help with  care including physicians, nurses, aids, chaplains, and social workers.  Hospice's goal  is to keep patients out of the hospital and and comfortable by controlling symptoms with medications.  The patient is certainly Hospice appropriate with a life expectancy of less than 6 months.  The patient has declined active therapy at this time.  The patient understands that he can be discharged from Hospice at anytime.    At some time, he may require sedation due to his belligerence.   Likewise, he has a macrocytic anemia possibly from B12 deficiency/folate deficiency due to his EtOHism.  He is not interested in labs or treatment.    He or his sister are invited to call us for questions/concerns.  If he were to change his mind, we would be happy to see him again for re-consideration of work-up and treatment.  2. Alcoholic intoxication without complication ([place]) Intoxicated today.  3. 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 COPD with acute exacerbation ([facility]) Decreased breath sounds on auscultation.  5. Seizures ([place]) Unknown etiology  6. AKI (acute kidney injury) ([place]) Renal function normal on labs on 04/17/2020.   ORDERS PLACED FOR THIS ENCOUNTER: No orders of the defined types were placed in this encounter.   MEDICATIONS PRESCRIBED THIS ENCOUNTER: No orders of the defined types were placed in this encounter.   All questions were answered. The patient knows to call the clinic with any problems, questions or concerns. We can certainly see the patient much sooner if necessary.  Patient discussed with Dr. Marin Olp and together we ascertained an up-to-date interval history, and examined the patient.  Dr. Marin Olp developed the patient's assessment and plan.  This was a shared visit-consultation.  His attestation will follow below.  This note is electronically signed by: Robynn Pane, PA-C 04/21/2020 3:20 PM   ADDENDUM:  I totally agree with Gershon Mussel.  This is quite unfortunate.  The patient has very little insight into his problem.  The heavy  and continued EtOH use is a real problem and Mr. Quadros has made it clear that he will not quit.  We apprised him of the risk of not getting treated.  The biggest risk is that as the neck mass grows there is the significant likelihood that it will erode into a major blood vessel and he will bleed out.  I think he understands this.  If he ever decides to quit drinking and get treated, we would be more that happy to see him back and try to help.  I would suspect that he would be a candidate for combo chemo/XRT, unless he has metastatic disease below the neck.  This was a truly challenging case and the patient was not too cooperative due to heavy EtOH use.   Lattie Haw, MD

## 2020-05-10 ENCOUNTER — Emergency Department (HOSPITAL_COMMUNITY): Payer: Medicare HMO

## 2020-05-10 ENCOUNTER — Other Ambulatory Visit: Payer: Self-pay

## 2020-05-10 ENCOUNTER — Encounter (HOSPITAL_COMMUNITY): Payer: Self-pay

## 2020-05-10 ENCOUNTER — Emergency Department (HOSPITAL_COMMUNITY)
Admission: EM | Admit: 2020-05-10 | Discharge: 2020-05-10 | Disposition: A | Discharge status: Home / Self Care | Payer: Medicare HMO | Attending: Emergency Medicine | Admitting: Emergency Medicine

## 2020-05-10 DIAGNOSIS — R0602 Shortness of breath: Secondary | ICD-10-CM | POA: Diagnosis not present

## 2020-05-10 DIAGNOSIS — C801 Malignant (primary) neoplasm, unspecified: Secondary | ICD-10-CM | POA: Diagnosis not present

## 2020-05-10 DIAGNOSIS — F17210 Nicotine dependence, cigarettes, uncomplicated: Secondary | ICD-10-CM | POA: Insufficient documentation

## 2020-05-10 DIAGNOSIS — F0390 Unspecified dementia without behavioral disturbance: Secondary | ICD-10-CM | POA: Insufficient documentation

## 2020-05-10 DIAGNOSIS — J449 Chronic obstructive pulmonary disease, unspecified: Secondary | ICD-10-CM | POA: Insufficient documentation

## 2020-05-10 DIAGNOSIS — Z85118 Personal history of other malignant neoplasm of bronchus and lung: Secondary | ICD-10-CM | POA: Insufficient documentation

## 2020-05-10 LAB — CBC
HCT: 39.4 % (ref 39.0–52.0)
Hemoglobin: 12.7 g/dL — ABNORMAL LOW (ref 13.0–17.0)
MCH: 34.4 pg — ABNORMAL HIGH (ref 26.0–34.0)
MCHC: 32.2 g/dL (ref 30.0–36.0)
MCV: 106.8 fL — ABNORMAL HIGH (ref 80.0–100.0)
Platelets: 177 10*3/uL (ref 150–400)
RBC: 3.69 MIL/uL — ABNORMAL LOW (ref 4.22–5.81)
RDW: 15.5 % (ref 11.5–15.5)
WBC: 9.8 10*3/uL (ref 4.0–10.5)
nRBC: 0 % (ref 0.0–0.2)

## 2020-05-10 LAB — BASIC METABOLIC PANEL
Anion gap: 11 (ref 5–15)
BUN: 11 mg/dL (ref 8–23)
CO2: 24 mmol/L (ref 22–32)
Calcium: 9 mg/dL (ref 8.9–10.3)
Chloride: 103 mmol/L (ref 98–111)
Creatinine, Ser: 0.93 mg/dL (ref 0.61–1.24)
GFR calc Af Amer: >60 mL/min (ref >60)
GFR calc non Af Amer: >60 mL/min (ref >60)
Glucose, Bld: 80 mg/dL (ref 70–99)
Potassium: 5 mmol/L (ref 3.5–5.1)
Sodium: 138 mmol/L (ref 135–145)

## 2020-05-10 LAB — ETHANOL: Alcohol, Ethyl (B): <10 mg/dL (ref <10)

## 2020-05-10 LAB — CBG MONITORING, ED: Glucose-Capillary: 120 mg/dL — ABNORMAL HIGH (ref 70–99)

## 2020-05-10 LAB — D-DIMER, QUANTITATIVE: D-Dimer, Quant: 0.85 ug/mL-FEU — ABNORMAL HIGH (ref 0.00–0.50)

## 2020-05-10 MED ORDER — IOHEXOL 350 MG/ML SOLN
100.0000 mL | Freq: Once | INTRAVENOUS | Status: AC | PRN
  Administered 2020-05-10: 100 mL via INTRAVENOUS

## 2020-05-10 NOTE — Progress Notes (Signed)
Manufacturing engineer [facility])  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  [name] called hospice today to advise he was feeling more SOB.  Hospice RN was en route to see him at his home.  [name] felt he could not wait and elected to go to the ED for further evaluation.  He is a full code.  [facility] will follow during his hospital visit.  Should he d/c back home, we will f/u with him in his home.  Venia Carbon RN, BSN, [facility] Liaison (epic chat)

## 2020-05-10 NOTE — ED Triage Notes (Addendum)
Per EMS- Pt from home under Hospice care for lung cancer. PT states that Hospice told pt to go to ER for any increased SOB. Pt c/o increased SOB.  Pt O2 99% room air. Pt breathing unlabored. No other complaint.   EMS reports pt had hypoglycemia secondary to anorexia. CBG 64 upon EMS arrival. Oral glucose given. States CBG in route 140.   Pt A&O in triage. Lives alone. Endorses smoking 1/2 pack per day currently.

## 2020-05-10 NOTE — ED Provider Notes (Signed)
[facility] Emergency Department Provider Note MRN:  [id number]  Arrival date & time: 05/10/20     Chief Complaint   Shortness of Breath   History of Present Illness   Keith Roberson is a 69 y.o. year-old male with a history of COPD, alcohol abuse, tonsillar cancer presenting to the ED with chief complaint of shortness of breath.  Shortness of breath this morning, mild, constant.  No other complaints, denies chest pain, no fever, no cough, no abdominal pain, no numbness or weakness.  Patient is not interested in providing additional information.  Review of Systems  A complete 10 system review of systems was obtained and all systems are negative except as noted in the HPI and PMH.   Patient's Health History    Past Medical History:  Diagnosis Date  . Cancer ([place])    lung  . COPD (chronic obstructive pulmonary disease) ([place])   . Dementia ([place])   . Dyspnea   . Neck mass 04/21/2020  . Seizures ([place])     History reviewed. No pertinent surgical history.  Family History  Problem Relation Age of Onset  . Breast cancer Mother   . Prostate cancer Father     Social History   Socioeconomic History  . Marital status: Divorced    Spouse name: Not on file  . Number of children: 1  . Years of education: Not on file  . Highest education level: Not on file  Occupational History  . Occupation: DISABLED  Tobacco Use  . Smoking status: Current Every Day Smoker    Packs/day: 0.50    Types: Cigarettes  . Smokeless tobacco: Never Used  Vaping Use  . Vaping Use: Never used  Substance and Sexual Activity  . Alcohol use: Yes    Comment: occ  . Drug use: No  . Sexual activity: Not Currently  Other Topics Concern  . Not on file  Social History Narrative  . Not on file   Social Determinants of Health   Financial Resource Strain: Low Risk   . Difficulty of Paying Living Expenses: Not hard at all  Food Insecurity: No Food Insecurity  . Worried About Paediatric nurse in the Last Year: Never true  . Ran Out of Food in the Last Year: Never true  Transportation Needs: Unmet Transportation Needs  . Lack of Transportation (Medical): Yes  . Lack of Transportation (Non-Medical): Yes  Physical Activity: Inactive  . Days of Exercise per Week: 0 days  . Minutes of Exercise per Session: 0 min  Stress: No Stress Concern Present  . Feeling of Stress : Not at all  Social Connections: Socially Isolated  . Frequency of Communication with Friends and Family: Once a week  . Frequency of Social Gatherings with Friends and Family: Once a week  . Attends Religious Services: More than 4 times per year  . Active Member of Clubs or Organizations: No  . Attends Archivist Meetings: Never  . Marital Status: Divorced  Human resources officer Violence:   . Fear of Current or Ex-Partner:   . Emotionally Abused:   Marland Kitchen Physically Abused:   . Sexually Abused:      Physical Exam   Vitals:   05/10/20 1400 05/10/20 1430  BP: (!) 162/95 (!) 159/94  Pulse:  76  Resp: 14 18  Temp:    SpO2:  99%    CONSTITUTIONAL: Chronically ill-appearing, NAD NEURO:  Alert and oriented x 3, no focal deficits EYES:  eyes equal and reactive ENT/NECK:  no LAD, no JVD; large firm mass to the right lateral neck CARDIO: Regular rate, well-perfused, normal S1 and S2 PULM:  CTAB no wheezing or rhonchi GI/GU:  normal bowel sounds, non-distended, non-tender MSK/SPINE:  No gross deformities, no edema SKIN:  no rash, atraumatic PSYCH:  Appropriate speech and behavior  *Additional and/or pertinent findings included in MDM below  Diagnostic and Interventional Summary    EKG Interpretation  Date/Time:  Wednesday May 10 2020 12:17:16 EDT Ventricular Rate:  81 PR Interval:    QRS Duration: 77 QT Interval:  375 QTC Calculation: 436 R Axis:   66 Text Interpretation: Sinus rhythm Confirmed by Gerlene Fee [phone]) on 05/10/2020 2:02:59 PM      Labs Reviewed  CBC - Abnormal;  Notable for the following components:      Result Value   RBC 3.69 (*)    Hemoglobin 12.7 (*)    MCV 106.8 (*)    MCH 34.4 (*)    All other components within normal limits  D-DIMER, QUANTITATIVE (NOT AT [facility]) - Abnormal; Notable for the following components:   D-Dimer, Quant 0.85 (*)    All other components within normal limits  CBG MONITORING, ED - Abnormal; Notable for the following components:   Glucose-Capillary 120 (*)    All other components within normal limits  BASIC METABOLIC PANEL  ETHANOL    DG Chest Port 1 View  Final Result    CT ANGIO CHEST PE W OR WO CONTRAST    (Results Pending)    Medications - No data to display   Procedures  /  Critical Care Procedures  ED Course and Medical Decision Making  I have reviewed the triage vital signs, the nursing notes, and pertinent available records from the EMR.  Listed above are laboratory and imaging tests that I personally ordered, reviewed, and interpreted and then considered in my medical decision making (see below for details).      Patient does not provide much history, per documentation he has neck mass concerning for tonsillar cancer with spread to the lymph nodes, he followed up with an oncologist but arrived intoxicated and was verbally abusive to the physician and staff.  He is receiving hospice care at home.  Worsening shortness of breath this morning per patient but he is here in no acute distress, no tachypnea, clear lungs, satting 100% with normal vital signs.  No evidence of DVT.  Spoke with sister over the phone, who has seen him with some respiratory distress intermittently over the past few days.  Screening with labs, D-dimer, chest x-ray.  Work-up is overall reassuring with the exception of mildly elevated D-dimer, given her active malignancy will follow up with CTA chest.  Signed out to oncoming provider at shift change, with out acute findings suspect would be appropriate for discharge.  Barth Kirks. [place]onia Small,  [place] [email or web address]  Final Clinical Impressions(s) / ED Diagnoses     ICD-10-CM   1. Shortness of breath  R06.02   2. Malignancy ([place])  C80.1     ED Discharge Orders    None       Discharge Instructions Discussed with and Provided to Patient:   Discharge Instructions   None       Maudie Flakes, MD 05/10/20 1439

## 2020-05-10 NOTE — Discharge Instructions (Signed)
Your tests don't show any changes See your doctor this week for follow up ER for worsening symptoms.

## 2020-05-28 ENCOUNTER — Other Ambulatory Visit: Payer: Self-pay

## 2020-05-28 ENCOUNTER — Encounter (HOSPITAL_COMMUNITY): Payer: Self-pay | Admitting: Emergency Medicine

## 2020-05-28 ENCOUNTER — Emergency Department (HOSPITAL_COMMUNITY)
Admission: EM | Admit: 2020-05-28 | Discharge: 2020-05-28 | Disposition: A | Discharge status: Home / Self Care | Attending: Emergency Medicine | Admitting: Emergency Medicine

## 2020-05-28 ENCOUNTER — Emergency Department (HOSPITAL_COMMUNITY)

## 2020-05-28 DIAGNOSIS — J439 Emphysema, unspecified: Secondary | ICD-10-CM | POA: Insufficient documentation

## 2020-05-28 DIAGNOSIS — F17210 Nicotine dependence, cigarettes, uncomplicated: Secondary | ICD-10-CM | POA: Insufficient documentation

## 2020-05-28 DIAGNOSIS — R0602 Shortness of breath: Secondary | ICD-10-CM | POA: Diagnosis present

## 2020-05-28 DIAGNOSIS — F0390 Unspecified dementia without behavioral disturbance: Secondary | ICD-10-CM | POA: Insufficient documentation

## 2020-05-28 DIAGNOSIS — Z85118 Personal history of other malignant neoplasm of bronchus and lung: Secondary | ICD-10-CM | POA: Diagnosis not present

## 2020-05-28 LAB — CBC WITH DIFFERENTIAL/PLATELET
Abs Immature Granulocytes: 0.02 10*3/uL (ref 0.00–0.07)
Basophils Absolute: 0 10*3/uL (ref 0.0–0.1)
Basophils Relative: 0 %
Eosinophils Absolute: 0.1 10*3/uL (ref 0.0–0.5)
Eosinophils Relative: 1 %
HCT: 43.4 % (ref 39.0–52.0)
Hemoglobin: 14.6 g/dL (ref 13.0–17.0)
Immature Granulocytes: 0 %
Lymphocytes Relative: 28 %
Lymphs Abs: 2.1 10*3/uL (ref 0.7–4.0)
MCH: 35.1 pg — ABNORMAL HIGH (ref 26.0–34.0)
MCHC: 33.6 g/dL (ref 30.0–36.0)
MCV: 104.3 fL — ABNORMAL HIGH (ref 80.0–100.0)
Monocytes Absolute: 0.4 10*3/uL (ref 0.1–1.0)
Monocytes Relative: 6 %
Neutro Abs: 4.8 10*3/uL (ref 1.7–7.7)
Neutrophils Relative %: 65 %
Platelets: 197 10*3/uL (ref 150–400)
RBC: 4.16 MIL/uL — ABNORMAL LOW (ref 4.22–5.81)
RDW: 14.9 % (ref 11.5–15.5)
WBC: 7.4 10*3/uL (ref 4.0–10.5)
nRBC: 0 % (ref 0.0–0.2)

## 2020-05-28 LAB — BASIC METABOLIC PANEL
Anion gap: 13 (ref 5–15)
BUN: 12 mg/dL (ref 8–23)
CO2: 24 mmol/L (ref 22–32)
Calcium: 9.3 mg/dL (ref 8.9–10.3)
Chloride: 102 mmol/L (ref 98–111)
Creatinine, Ser: 1.15 mg/dL (ref 0.61–1.24)
GFR calc Af Amer: >60 mL/min (ref >60)
GFR calc non Af Amer: >60 mL/min (ref >60)
Glucose, Bld: 84 mg/dL (ref 70–99)
Potassium: 5 mmol/L (ref 3.5–5.1)
Sodium: 139 mmol/L (ref 135–145)

## 2020-05-28 LAB — BRAIN NATRIURETIC PEPTIDE: B Natriuretic Peptide: 25 pg/mL (ref 0.0–100.0)

## 2020-05-28 LAB — TROPONIN I (HIGH SENSITIVITY): Troponin I (High Sensitivity): 5 ng/L (ref <18)

## 2020-05-28 MED ORDER — ALBUTEROL SULFATE HFA 108 (90 BASE) MCG/ACT IN AERS: 2.0000 | INHALATION_SPRAY | RESPIRATORY_TRACT | 3 refills | Status: AC | PRN

## 2020-05-28 MED ORDER — IPRATROPIUM-ALBUTEROL 0.5-2.5 (3) MG/3ML IN SOLN: 3.0000 mL | Freq: Once | RESPIRATORY_TRACT | Status: DC

## 2020-05-28 MED ORDER — ALBUTEROL SULFATE HFA 108 (90 BASE) MCG/ACT IN AERS
4.0000 | INHALATION_SPRAY | Freq: Once | RESPIRATORY_TRACT | Status: AC
  Administered 2020-05-28: 4 via RESPIRATORY_TRACT
  Filled 2020-05-28: qty 6.7

## 2020-05-28 NOTE — ED Provider Notes (Signed)
[facility] EMERGENCY DEPARTMENT Provider Note   CSN: [id number] Arrival date & time: 05/28/20  1557     History Chief Complaint  Patient presents with  . Shortness of Breath    Keith Roberson is a 69 y.o. male presented to emergency department with complaint of shortness of breath.  The patient is very poor historian.  Tells me he has no known lung problems, although his medical records indicate that he has had lung cancer as well as COPD.  Appears he suffers from dementia.  Comes in complaining of shortness of breath beginning yesterday.  He says he noticed when he woke up in the morning just feeling short of breath.  He said it is bad even when he is sitting still.  He denies that he is having dyspnea on exertion.  He denies any chest pain or pressure.  He reports he continues to smoke cigarettes daily.  Medical records reviewed the patient has been seen 3 times in the past 3 months for the exact same complaints of shortness of breath.  In July he had a positive D-dimer and subsequent PE study, which showed no acute pulmonary embolism but noted that he had diffuse bronchial wall thickening and cystic changes in the left lower lobe, unchanged from prior imaging.  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  HPI     Past Medical History:  Diagnosis Date  . Cancer ([place])    lung  . COPD (chronic obstructive pulmonary disease) ([place])   . Dementia ([place])   . Dyspnea   . Neck mass 04/21/2020  . Seizures [facility])     Patient Active Problem List   Diagnosis Date Noted  . Neck mass 04/21/2020  . Respiratory failure with hypoxia ([place]) 10/04/2019  . COPD with acute exacerbation ([place]) 10/04/2019  . AKI (acute kidney injury) ([place]) 10/04/2019  . Alcohol abuse 10/06/2018  . Alcohol intoxication ([place]) 10/06/2018  . Dementia ([place]) 10/06/2018  . Seizures ([place]) 10/06/2018  . Special screening for malignant neoplasms, colon  10/23/2016    History reviewed. No pertinent surgical history.     Family History  Problem Relation Age of Onset  . Breast cancer Mother   . Prostate cancer Father     Social History   Tobacco Use  . Smoking status: Current Every Day Smoker    Packs/day: 0.50    Types: Cigarettes  . Smokeless tobacco: Never Used  Vaping Use  . Vaping Use: Never used  Substance Use Topics  . Alcohol use: Yes    Comment: occ  . Drug use: No    Home Medications Prior to Admission medications   Medication Sig Start Date End Date Taking? Authorizing Provider  albuterol (VENTOLIN HFA) 108 (90 Base) MCG/ACT inhaler Inhale 2 puffs into the lungs every 6 (six) hours as needed for wheezing or shortness of breath. Patient not taking: Reported on 04/21/2020    [provider]  albuterol (VENTOLIN HFA) 108 (90 Base) MCG/ACT inhaler Inhale 2 puffs into the lungs every 4 (four) hours as needed for wheezing or shortness of breath. 05/28/20   Wyvonnia Dusky, MD  famotidine (PEPCID) 20 MG tablet Take 20 mg by mouth 2 (two) times daily. Patient not taking: Reported on 05/10/2020    [provider]  HYDROcodone-acetaminophen (NORCO/VICODIN) 5-325 MG tablet Take 1 tablet by mouth every 6 (six) hours as needed for moderate pain. Patient not taking:  Reported on 04/21/2020    [provider]  ondansetron (ZOFRAN-ODT) 4 MG disintegrating tablet Take 4 mg by mouth every 8 (eight) hours as needed for nausea or vomiting. Patient not taking: Reported on 04/21/2020    [provider]  senna (SENOKOT) 8.6 MG TABS tablet Take 1 tablet by mouth daily. Patient not taking: Reported on 05/10/2020    [provider]    Allergies    Patient has no known allergies.  Review of Systems   Review of Systems  Constitutional: Negative for chills and fever.  HENT: Negative for ear pain and sore throat.   Eyes: Negative for pain and visual disturbance.  Respiratory: Positive for shortness  of breath. Negative for cough.   Cardiovascular: Negative for chest pain and palpitations.  Gastrointestinal: Negative for abdominal pain and vomiting.  Genitourinary: Negative for dysuria and hematuria.  Musculoskeletal: Negative for arthralgias and back pain.  Skin: Negative for color change and rash.  Neurological: Negative for seizures and syncope.  All other systems reviewed and are negative.   Physical Exam Updated Vital Signs BP (!) 145/90   Pulse 75   Temp 99 F (37.2 C) (Oral)   Ht 5' 11.5" (1.816 m)   Wt 59.9 kg   SpO2 100%   BMI 18.15 kg/m   Physical Exam Vitals and nursing note reviewed.  Constitutional:      Appearance: He is well-developed.  HENT:     Head: Normocephalic and atraumatic.  Eyes:     Conjunctiva/sclera: Conjunctivae normal.  Cardiovascular:     Rate and Rhythm: Normal rate and regular rhythm.  Pulmonary:     Effort: Pulmonary effort is normal. No respiratory distress.     Breath sounds: Normal breath sounds. No decreased breath sounds or wheezing.  Abdominal:     Palpations: Abdomen is soft.     Tenderness: There is no abdominal tenderness.  Musculoskeletal:     Cervical back: Neck supple.  Skin:    General: Skin is warm and dry.  Neurological:     General: No focal deficit present.     Mental Status: He is alert and oriented to person, place, and time.  Psychiatric:        Mood and Affect: Mood normal.        Behavior: Behavior normal.     ED Results / Procedures / Treatments   Labs (all labs ordered are listed, but only abnormal results are displayed) Labs Reviewed  CBC WITH DIFFERENTIAL/PLATELET - Abnormal; Notable for the following components:      Result Value   RBC 4.16 (*)    MCV 104.3 (*)    MCH 35.1 (*)    All other components within normal limits  SARS CORONAVIRUS 2 BY RT PCR (HOSPITAL ORDER, [facility])  BASIC METABOLIC PANEL  BRAIN NATRIURETIC PEPTIDE  TROPONIN I (HIGH SENSITIVITY)     EKG EKG Interpretation  Date/Time:  Sunday May 28 2020 16:34:51 EDT Ventricular Rate:  83 PR Interval:    QRS Duration: 76 QT Interval:  350 QTC Calculation: 412 R Axis:   68 Text Interpretation: Sinus rhythm No STEMI Confirmed by Octaviano Glow [phone]) on 05/28/2020 4:47:33 PM   Radiology DG Chest Portable 1 View  Result Date: 05/28/2020 CLINICAL DATA:  Shortness of breath, evaluate for pneumonia EXAM: PORTABLE CHEST 1 VIEW COMPARISON:  05/10/2020 FINDINGS: The heart size and mediastinal contours are within normal limits. Emphysema. The visualized skeletal structures are unremarkable. IMPRESSION: Emphysema without  acute abnormality of the lungs in AP portable projection. Electronically Signed   By: Eddie Candle M.D.   On: 05/28/2020 17:21    Procedures Procedures (including critical care time)  Medications Ordered in ED Medications  albuterol (VENTOLIN HFA) 108 (90 Base) MCG/ACT inhaler 4 puff (4 puffs Inhalation Given 05/28/20 1809)    ED Course  I have reviewed the triage vital signs and the nursing notes.  Pertinent labs & imaging results that were available during my care of the patient were reviewed by me and considered in my medical decision making (see chart for details).  69 yo male here with SOB for several days. Medical chart review shows multiple ED visits over the past few months for identical complaints, and extensive w/u including negative CT chest for PE on 05/10/20.  Today he looks very comfortable.  Exam does not suggest CHF exacerbation, pulmonary edema, or PE.  Labs reviewed and wnl.  No elevated trop.  ECG per my read is not acutely ischemia.  Doubt ACS.  Xray per my read shows hyperexpanded lungs consistent with emphysema.  Doubt PNA or PTX.  I doubt this is a clinically significant COPD exacerbation.  He has no wheezing, no hypoxia, no increased work of breathing.  Patient refusing covid testing.  Plan to give albuterol pumps.  He hasn't been  using his inhaler at home.  Discharge instructions given to attempt this and reach out to his PCP.  Clinical Course as of May 29 126  Sun May 28, 2020  1831 Reassessment the patient's vital signs are stable.  He refused a Covid swab.  He does not have any significant wheezing on exam.  I suspect this is likely COPD or emphysema.  Will prescribe him albuterol and give him an inhaler to go home with.  He reports has not been using it much at home and advised that he do so.   [MT]    Clinical Course User Index [MT] Jeffrie Lofstrom, Carola Rhine, MD    Final Clinical Impression(s) / ED Diagnoses Final diagnoses:  Pulmonary emphysema, unspecified emphysema type ([place])  Shortness of breath    Rx / DC Orders ED Discharge Orders         Ordered    albuterol (VENTOLIN HFA) 108 (90 Base) MCG/ACT inhaler  Every 4 hours PRN     Discontinue  Reprint     05/28/20 1835           Wyvonnia Dusky, MD 05/29/20 0128

## 2020-05-28 NOTE — Discharge Instructions (Addendum)
Your bloodwork and xrays today did not show any medical emergencies.  I think you have COPD or emphysema.  This is an issue that can flare up and cause problems breathing.  This is the same diagnosis you were given on your last few visits to the ER.  You need to use your inhalers at home, and make an appointment with a primary care provider to manage this better at home.  Try to stay out of the humidity.  Use your albuterol inhaler at home ever 4 to 6 hours, 2 puffs at a time.  Call to schedule an appointment with your doctor at the wellness clinic.  This issue can be managed by a primary care provider.

## 2020-05-28 NOTE — ED Notes (Signed)
Pt refused Covid test after multiple attempts to obtain sample

## 2020-05-28 NOTE — ED Triage Notes (Signed)
Per EMS, pt called out for SOB x 2 hours. Pt was sitting on front porch on EMS arrival. EMS placed pt on 2L [place]. PT has hx of lung CA. Pt breathing unlabored at this time.

## 2020-07-23 ENCOUNTER — Encounter: Payer: Self-pay | Admitting: Emergency Medicine

## 2020-07-23 ENCOUNTER — Emergency Department: Payer: Medicare Other

## 2020-07-23 ENCOUNTER — Other Ambulatory Visit: Payer: Self-pay

## 2020-07-23 ENCOUNTER — Emergency Department
Admission: EM | Admit: 2020-07-23 | Discharge: 2020-07-26 | Disposition: A | Discharge status: Home / Self Care | Payer: Medicare Other | Attending: Emergency Medicine | Admitting: Emergency Medicine

## 2020-07-23 DIAGNOSIS — Z20822 Contact with and (suspected) exposure to covid-19: Secondary | ICD-10-CM | POA: Insufficient documentation

## 2020-07-23 DIAGNOSIS — Z85118 Personal history of other malignant neoplasm of bronchus and lung: Secondary | ICD-10-CM | POA: Insufficient documentation

## 2020-07-23 DIAGNOSIS — Z79899 Other long term (current) drug therapy: Secondary | ICD-10-CM | POA: Diagnosis not present

## 2020-07-23 DIAGNOSIS — F10929 Alcohol use, unspecified with intoxication, unspecified: Secondary | ICD-10-CM

## 2020-07-23 DIAGNOSIS — E0590 Thyrotoxicosis, unspecified without thyrotoxic crisis or storm: Secondary | ICD-10-CM | POA: Insufficient documentation

## 2020-07-23 DIAGNOSIS — F17210 Nicotine dependence, cigarettes, uncomplicated: Secondary | ICD-10-CM | POA: Insufficient documentation

## 2020-07-23 DIAGNOSIS — F0390 Unspecified dementia without behavioral disturbance: Secondary | ICD-10-CM | POA: Diagnosis not present

## 2020-07-23 DIAGNOSIS — F29 Unspecified psychosis not due to a substance or known physiological condition: Secondary | ICD-10-CM | POA: Diagnosis not present

## 2020-07-23 DIAGNOSIS — R4182 Altered mental status, unspecified: Secondary | ICD-10-CM

## 2020-07-23 DIAGNOSIS — R258 Other abnormal involuntary movements: Secondary | ICD-10-CM | POA: Diagnosis present

## 2020-07-23 LAB — CBC
HCT: 38.6 % — ABNORMAL LOW (ref 39.0–52.0)
Hemoglobin: 13 g/dL (ref 13.0–17.0)
MCH: 35.1 pg — ABNORMAL HIGH (ref 26.0–34.0)
MCHC: 33.7 g/dL (ref 30.0–36.0)
MCV: 104.3 fL — ABNORMAL HIGH (ref 80.0–100.0)
Platelets: 271 10*3/uL (ref 150–400)
RBC: 3.7 MIL/uL — ABNORMAL LOW (ref 4.22–5.81)
RDW: 16.2 % — ABNORMAL HIGH (ref 11.5–15.5)
WBC: 7.8 10*3/uL (ref 4.0–10.5)
nRBC: 0 % (ref 0.0–0.2)

## 2020-07-23 LAB — COMPREHENSIVE METABOLIC PANEL
ALT: 8 U/L (ref 0–44)
AST: 17 U/L (ref 15–41)
Albumin: 3.8 g/dL (ref 3.5–5.0)
Alkaline Phosphatase: 80 U/L (ref 38–126)
Anion gap: 10 (ref 5–15)
BUN: 9 mg/dL (ref 8–23)
CO2: 26 mmol/L (ref 22–32)
Calcium: 8.7 mg/dL — ABNORMAL LOW (ref 8.9–10.3)
Chloride: 105 mmol/L (ref 98–111)
Creatinine, Ser: 0.97 mg/dL (ref 0.61–1.24)
GFR calc Af Amer: >60 mL/min (ref >60)
GFR calc non Af Amer: >60 mL/min (ref >60)
Glucose, Bld: 82 mg/dL (ref 70–99)
Potassium: 4.2 mmol/L (ref 3.5–5.1)
Sodium: 141 mmol/L (ref 135–145)
Total Bilirubin: 0.6 mg/dL (ref 0.3–1.2)
Total Protein: 7.2 g/dL (ref 6.5–8.1)

## 2020-07-23 LAB — ACETAMINOPHEN LEVEL: Acetaminophen (Tylenol), Serum: <10 ug/mL — ABNORMAL LOW (ref 10–30)

## 2020-07-23 LAB — ETHANOL: Alcohol, Ethyl (B): 248 mg/dL — ABNORMAL HIGH (ref <10)

## 2020-07-23 LAB — SALICYLATE LEVEL: Salicylate Lvl: <7 mg/dL — ABNORMAL LOW (ref 7.0–30.0)

## 2020-07-23 LAB — TSH: TSH: 0.142 u[IU]/mL — ABNORMAL LOW (ref 0.350–4.500)

## 2020-07-23 MED ORDER — ONDANSETRON 4 MG PO TBDP: 4.0000 mg | ORAL_TABLET | Freq: Three times a day (TID) | ORAL | Status: DC | PRN

## 2020-07-23 MED ORDER — ALBUTEROL SULFATE HFA 108 (90 BASE) MCG/ACT IN AERS: 2.0000 | INHALATION_SPRAY | Freq: Four times a day (QID) | RESPIRATORY_TRACT | Status: DC | PRN

## 2020-07-23 MED ORDER — LORAZEPAM 2 MG PO TABS
2.0000 mg | ORAL_TABLET | Freq: Once | ORAL | Status: AC
  Administered 2020-07-23: 2 mg via ORAL
  Filled 2020-07-23: qty 1

## 2020-07-23 MED ORDER — IOHEXOL 300 MG/ML  SOLN
75.0000 mL | Freq: Once | INTRAMUSCULAR | Status: AC | PRN
  Administered 2020-07-23: 75 mL via INTRAVENOUS

## 2020-07-23 MED ORDER — HALOPERIDOL 5 MG PO TABS
5.0000 mg | ORAL_TABLET | Freq: Once | ORAL | Status: AC
  Administered 2020-07-23: 5 mg via ORAL
  Filled 2020-07-23: qty 1

## 2020-07-23 MED ORDER — ALBUTEROL SULFATE HFA 108 (90 BASE) MCG/ACT IN AERS
2.0000 | INHALATION_SPRAY | RESPIRATORY_TRACT | Status: DC | PRN
  Filled 2020-07-23: qty 6.7

## 2020-07-23 MED ORDER — FAMOTIDINE 20 MG PO TABS
20.0000 mg | ORAL_TABLET | Freq: Two times a day (BID) | ORAL | Status: DC
  Administered 2020-07-24 – 2020-07-26 (×5): 20 mg via ORAL
  Filled 2020-07-23 (×5): qty 1

## 2020-07-23 MED ORDER — SENNA 8.6 MG PO TABS
1.0000 | ORAL_TABLET | Freq: Every day | ORAL | Status: DC
  Administered 2020-07-25 – 2020-07-26 (×2): 8.6 mg via ORAL
  Filled 2020-07-23 (×2): qty 1

## 2020-07-23 NOTE — ED Notes (Signed)
Pt stating to officer, "I want the money out of my pocketbook now."

## 2020-07-23 NOTE — BH Assessment (Signed)
Patient is unable to participate in meaningful assessment at his time due to PO ativan and haldol given at approximately 1800.  Will make another attempt to assess patient prior to end of shift.

## 2020-07-23 NOTE — ED Notes (Signed)
Gave PT a snack of a sandwich tray, icecream, and apple juice but client did not want to wake up to eat it.
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
## 2020-07-23 NOTE — ED Notes (Addendum)
Dressed out by this RN and Research scientist (medical)  1 hat 1 pair shoes 1 pair socks 1 black shirt 1 pair jeans 1 wallet 1 watch in pants pocket 1 t shirt 1 black belt  1 pair boxers

## 2020-07-23 NOTE — ED Notes (Signed)
Pt becoming agitated and wanting to leave. PRN PO medications given as ordered.  Pt took meds with no issue.

## 2020-07-23 NOTE — ED Notes (Signed)
Pt given dinner tray.

## 2020-07-23 NOTE — ED Notes (Signed)
When asked why he as brought to the hospital the patient stated, "Money. It's all about money."  Pt agitated and wanting to leave.

## 2020-07-24 LAB — URINE DRUG SCREEN, QUALITATIVE (ARMC ONLY)
Amphetamines, Ur Screen: NOT DETECTED
Barbiturates, Ur Screen: NOT DETECTED
Benzodiazepine, Ur Scrn: NOT DETECTED
Cannabinoid 50 Ng, Ur ~~LOC~~: NOT DETECTED
Cocaine Metabolite,Ur ~~LOC~~: NOT DETECTED
MDMA (Ecstasy)Ur Screen: NOT DETECTED
Methadone Scn, Ur: NOT DETECTED
Opiate, Ur Screen: NOT DETECTED
Phencyclidine (PCP) Ur S: NOT DETECTED
Tricyclic, Ur Screen: NOT DETECTED

## 2020-07-24 LAB — RESPIRATORY PANEL BY RT PCR (FLU A&B, COVID)
Influenza A by PCR: NEGATIVE
Influenza B by PCR: NEGATIVE
SARS Coronavirus 2 by RT PCR: NEGATIVE

## 2020-07-24 MED ORDER — QUETIAPINE FUMARATE 25 MG PO TABS
25.0000 mg | ORAL_TABLET | Freq: Every day | ORAL | Status: DC
  Administered 2020-07-24 – 2020-07-25 (×2): 25 mg via ORAL
  Filled 2020-07-24 (×2): qty 1

## 2020-07-24 MED ORDER — CLONAZEPAM 0.5 MG PO TABS
0.5000 mg | ORAL_TABLET | Freq: Two times a day (BID) | ORAL | Status: DC
  Administered 2020-07-24 – 2020-07-26 (×4): 0.5 mg via ORAL
  Filled 2020-07-24 (×4): qty 1

## 2020-07-24 MED ORDER — HALOPERIDOL 2 MG PO TABS: 4.0000 mg | ORAL_TABLET | Freq: Two times a day (BID) | ORAL | Status: DC

## 2020-07-24 MED ORDER — HALOPERIDOL 1 MG PO TABS
3.0000 mg | ORAL_TABLET | Freq: Two times a day (BID) | ORAL | Status: DC
  Administered 2020-07-24 – 2020-07-26 (×5): 3 mg via ORAL
  Filled 2020-07-24 (×6): qty 3

## 2020-07-24 MED ORDER — BENZTROPINE MESYLATE 1 MG PO TABS
1.0000 mg | ORAL_TABLET | Freq: Two times a day (BID) | ORAL | Status: DC
  Administered 2020-07-24 – 2020-07-26 (×5): 1 mg via ORAL
  Filled 2020-07-24 (×5): qty 1

## 2020-07-24 NOTE — ED Notes (Signed)
Pt given a cup of water per request.

## 2020-07-24 NOTE — ED Notes (Signed)
VOL, pend dispo

## 2020-07-24 NOTE — ED Notes (Signed)
VOL, papers rescinded, pend dispo

## 2020-07-24 NOTE — ED Notes (Signed)
Report to include Situation, Background, Assessment, and Recommendations received from [place], [place]. Patient alert and oriented, warm and dry, in no acute distress. Patient denies SI, HI, AVH and pain. Patient made aware of Q15 minute rounds and Engineer, drilling presence for their safety. Patient instructed to come to this nurse with needs or concerns.

## 2020-07-24 NOTE — ED Notes (Signed)
Hourly rounding reveals patient awake in hallway bed. No complaints, stable, in no acute distress. Q15 minute rounds and monitoring via Engineer, drilling to continue.

## 2020-07-24 NOTE — ED Notes (Signed)
Patient ate 100% of supper and beverage, no signs of distress, will continue to monitor.

## 2020-07-24 NOTE — ED Notes (Addendum)
Patient's sister called and said that He lives with her and it has been very difficulty that He will curse at the family and is restless, keeping them up at night, and that Gis brother is involved in care as well, they are both interested in Him going to a group home if possible, especially if nothing is going to change with His status, she did say that they would pick him up if He was discharged and that she or His brother could come to get him, Her number is listed, and the Brother Demontrae Gilbert number is [phone].

## 2020-07-24 NOTE — ED Notes (Signed)
Hourly rounding reveals patient asleep in hallway bed. No complaints, stable, in no acute distress. Q15 minute rounds and monitoring via Engineer, drilling to continue.

## 2020-07-24 NOTE — Discharge Summary (Signed)
Physician Discharge Summary Note  Patient:  Keith Roberson is an 69 y.o., male MRN:  [id number] DOB:  04-May-1951 Patient phone:  [phone] (home)  Patient address:   [place] [address],  Total Time spent with patient:   One hour including meeting with guardian   Date of Admission:  07/23/2020 Date of Discharge:    07/24/20  Reason for Admission:    Dementia with behavioral disturbance   Principal Problem: <principal problem not specified>    Dementia and or cognitive changes with behavioral disturbance  Has medicare and Medicaid --family cannot manage his memory changes, disinhibition and general difficulties with overall behaviors   New med started today and he is taking them   He awaits now [facility] for NH placement   Guardian to speak in details with TTC  Patient is generally psych cleared at this point        Discharge Diagnoses: Active Problems:   * No active hospital problems. * Dementia with behavioral disturbance   Past Psychiatric History:  No regular follow up except at times primary care   Has two co guardians who want and seek placement   Past Medical History:  Past Medical History:  Diagnosis Date  . Cancer ([place])    lung  . COPD (chronic obstructive pulmonary disease) ([place])   . Dementia ([place])   . Dyspnea   . Neck mass 04/21/2020  . Seizures ([place])    History reviewed. No pertinent surgical history. Family History:  Family History  Problem Relation Age of Onset  . Breast cancer Mother   . Prostate cancer Father    Family Psychiatric  History:  Previously written  Social History:  Social History   Substance and Sexual Activity  Alcohol Use Yes   Comment: occ     Social History   Substance and Sexual Activity  Drug Use No    Social History   Socioeconomic History  . Marital status: Divorced    Spouse name: Not on file  . Number of children: 1  . Years of education: Not on file  . Highest education  level: Not on file  Occupational History  . Occupation: DISABLED  Tobacco Use  . Smoking status: Current Every Day Smoker    Packs/day: 0.50    Types: Cigarettes  . Smokeless tobacco: Never Used  Vaping Use  . Vaping Use: Never used  Substance and Sexual Activity  . Alcohol use: Yes    Comment: occ  . Drug use: No  . Sexual activity: Not Currently  Other Topics Concern  . Not on file  Social History Narrative  . Not on file   Social Determinants of Health   Financial Resource Strain: Low Risk   . Difficulty of Paying Living Expenses: Not hard at all  Food Insecurity: No Food Insecurity  . Worried About Charity fundraiser in the Last Year: Never true  . Ran Out of Food in the Last Year: Never true  Transportation Needs: Unmet Transportation Needs  . Lack of Transportation (Medical): Yes  . Lack of Transportation (Non-Medical): Yes  Physical Activity: Inactive  . Days of Exercise per Week: 0 days  . Minutes of Exercise per Session: 0 min  Stress: No Stress Concern Present  . Feeling of Stress : Not at all  Social Connections: Socially Isolated  . Frequency of Communication with Friends and Family: Once a week  . Frequency of Social Gatherings with Friends and Family: Once a  week  . Attends Religious Services: More than 4 times per year  . Active Member of Clubs or Organizations: No  . Attends Archivist Meetings: Never  . Marital Status: Divorced    Hospital Course:    He was admitted when he came in and IVC rescinded --remains okay in ER however cannot go home due to behaviors Awaits NH placement with psych meds written  --now has to wait here for NH placement       Physical Findings: AIMS:  , ,  ,  ,   not needed  CIWA:    COWS:     Musculoskeletal: Strength & Muscle Tone: thin wiry  Gait & Station: slow  Patient leans: none   Psychiatric Specialty Exam: Physical Exam  Review of Systems  Blood pressure 109/68, pulse 94, temperature 97.8 F  (36.6 C), temperature source Oral, resp. rate 18, height 5\' 11"  (1.803 m), weight 73.5 kg, SpO2 100 %.Body mass index is 22.59 kg/m.    Appearance --haggard whizened thin --unkept strange Oriented times two or three Consciousness not clouded or fluctuant Mood and affect somewhat flat Speech low tone volume fluency okay  Judgement insight reliability all poor Fund of knowledge poor intelligence declining Memory makes errors on immediate and recent memory No frank SI or HI  Abstraction poor Eye contact and rapport fair to poor Thought process and content --vague No frank psychosis or mania  No shakes tics tremors                                                           Sleep at times erratic Cognition declining  ADL's--needs more care and direction Assets caring guardians Aims not n eeded Akathisia none Recall por Handedness not known  Language  --english        Has this patient used any form of tobacco in the last 30 days? (Cigarettes, Smokeless Tobacco, Cigars, and/or Pipes) Yes, on and off  Blood Alcohol level:  Lab Results  Component Value Date   ETH 248 (H) 07/23/2020   ETH <10 84/69/6295    Metabolic Disorder Labs:  Lab Results  Component Value Date   HGBA1C  10/22/2008    4.8 (NOTE)   The ADA recommends the following therapeutic goal for glycemic   control related to Hgb A1C measurement:   Goal of Therapy:   < 7.0% Hgb A1C   Reference: American Diabetes Association: Clinical Practice   Recommendations 2008, Diabetes Care,  2008, 31:(Suppl 1).   MPG 91 10/22/2008   No results found for: PROLACTIN Lab Results  Component Value Date   CHOL  10/22/2008    155        ATP III CLASSIFICATION:  <200     mg/dL   Desirable  200-239  mg/dL   Borderline High  >=240    mg/dL   High          TRIG 54 10/22/2008   HDL 54 10/22/2008   CHOLHDL 2.9 10/22/2008   VLDL 11 10/22/2008   LDLCALC  10/22/2008    90        Total  Cholesterol/HDL:CHD Risk Coronary Heart Disease Risk Table                     Men  Women  1/2 Average Risk   3.4   3.3  Average Risk       5.0   4.4  2 X Average Risk   9.6   7.1  3 X Average Risk  23.4   11.0        Use the calculated Patient Ratio above and the CHD Risk Table to determine the patient's CHD Risk.        ATP III CLASSIFICATION (LDL):  <100     mg/dL   Optimal  100-129  mg/dL   Near or Above                    Optimal  130-159  mg/dL   Borderline  160-189  mg/dL   High  >190     mg/dL   Very High    See Psychiatric Specialty Exam and Suicide Risk Assessment completed by Attending Physician prior to discharge.  Discharge destination:  Awaits NH placement now  --Psych cleared family and guardian cannot take him   Is patient on multiple antipsychotic therapies at discharge:  Yes    Has Patient had three or more failed trials of antipsychotic monotherapy by history:        N/a   Recommended Plan for Multiple Antipsychotic Therapies: Continue  Risks benefits side effects       Follow-up recommendations:   TOC consult to help with SW issues   Comments:  Would not benefit from gero psych at this time.     Psych cleared at this time .  Signed: Eulas Post, MD 07/24/2020, 12:46 PM

## 2020-07-24 NOTE — ED Provider Notes (Signed)
Emergency Medicine Observation Re-evaluation Note  Keith Roberson is a 69 y.o. male, seen on rounds today.  Pt initially presented to the ED for complaints of Psychiatric Evaluation Currently, the patient is sleeping.  Physical Exam  BP 109/68   Pulse 94   Temp 97.8 F (36.6 C) (Oral)   Resp 18   Ht 1.803 m (5\' 11" )   Wt 73.5 kg   SpO2 100%   BMI 22.59 kg/m  Physical Exam Gen:  No acute distress Resp:  Breathing easily and comfortably, no accessory muscle usage Neuro:  Moving all four extremities, no gross focal neuro deficits Psych:  Resting currently, required medication earlier tonight.  ED Course / MDM  EKG:    I have reviewed the labs performed to date as well as medications administered while in observation.  Recent changes in the last 24 hours include attempted evaluation by psychiatry, but the patient was too somnolent after medication.  Plan  Current plan is for reassessment by psychiatry to obtain dispo recommendations. Patient is under full IVC at this time.   Hinda Kehr, MD 07/24/20 [phone]
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
## 2020-07-24 NOTE — ED Notes (Addendum)
Hourly rounding reveals patient asleep in hallway bed. No complaints, stable, in no acute distress. Q15 minute rounds and monitoring via Engineer, drilling to continue.

## 2020-07-24 NOTE — ED Notes (Signed)
Pt took IV out and stated "it started to itch" as his reasoning. Pt cooperative and comfortable, resting in bed at this time.

## 2020-07-24 NOTE — ED Notes (Signed)
Pt given breakfast tray

## 2020-07-24 NOTE — ED Notes (Signed)
Gave food tray with juice. 

## 2020-07-24 NOTE — ED Notes (Signed)
Patient did ask to use the phone, he is cooperative, no signs of distress, He did eat 75% of lunch and beverage.
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
## 2020-07-25 NOTE — ED Notes (Signed)
Hourly rounding reveals patient asleep in hallway bed. No complaints, stable, in no acute distress. Q15 minute rounds and monitoring via Engineer, drilling to continue.

## 2020-07-25 NOTE — ED Notes (Signed)
Pt attempted to get up and go to restroom to void, restroom was occupied and pt urinated in bed. Bed linens and pt clothing changes to fresh, dry

## 2020-07-25 NOTE — ED Notes (Addendum)
Patient speaking loud to this writer saying, "I want to go to the lunch room."  This writer advised patient he is currently in the emergency room and we don't have a lunch room. EDP made aware. No new orders at this time.

## 2020-07-25 NOTE — ED Notes (Signed)
VOL  PENDING  PLACEMENT 

## 2020-07-25 NOTE — ED Notes (Signed)
Pt provided with warm blanket. 

## 2020-07-25 NOTE — Progress Notes (Signed)
Patient ID: Keith Roberson, male   DOB: 01-05-1951, 69 y.o.   MRN: [id number]   Rama Anne Fu MD  Psych Brief Note  Patient awaits [facility] consult and expertise in NH placement.  History of dementia with behavioral disturbance --family cannot handle --does not sleep well, makes poor judgements, has memory problems, wanders away  ---argues and cannot be handled and managed at home.  Decreasing self care needs direction and support   His Psych meds have again been adjusted today for max effects \   MS about the same  Haggard thin  Forlorn unkept  Mood and affect the same  No major insight, reliability and judgement  He feels he has no major problems and awaits his sis or bro to pick him up  No active SI HI or plans No active violent threats or plans  No other major side effects or medical   Psych cleared yesterday  Awaits best location in [place] or ER for awaiting disposition and transfer Will not benefit further on [facility] Psych or regular inpatient Psych

## 2020-07-25 NOTE — ED Notes (Signed)
Patient continues to come out of room and trying to go into other patients rooms. Writer and ED tech redirecting patient into his room. And reminding patient he can only be in his room and only come out to the restroom.  Patient getting loud with Probation officer and ED tech wanting something to eat. Sandwich tray given. Patient after getting sandwich tray came out of room again and had to be redirected to room.

## 2020-07-25 NOTE — TOC Progression Note (Signed)
Transition of Care [facility]) - Progression Note    Patient Details  Name: Keith Roberson MRN: [id number] Date of Birth: 11-14-1950  Transition of Care [facility]) CM/SW [place], [place] Phone Number:  [phone] 07/25/2020, 6:25 PM  Clinical Narrative:     CSW received call from [place] (Sister) [phone], [phone], the patient's brother/legal guardian [name], will contact CSW to pick up patient tomorrow morning.  Ms. Rolena Infante stated Mr. [name] will contact CSW when he is on his way to pick up the patient. CSW verbalized understanding.  Expected Discharge Plan: Group Home Barriers to Discharge: ED Unsafe disposition, ED Facility/Family Refusing to Allow Patient to Return, ED No Barriers  Expected Discharge Plan and Services Expected Discharge Plan: Group Home In-house Referral: Clinical Social Work     Living arrangements for the past 2 months: Single Family Home                                       Social Determinants of Health (SDOH) Interventions    Readmission Risk Interventions No flowsheet data found.

## 2020-07-25 NOTE — Progress Notes (Signed)
[facility] Liaison note:  Patient is currently followed by Manufacturing engineer at home with a hospice diagnosis of throat cancer. He is a DNR code with out of facility DNR in place in the home. Per chart notes patient was brought to the [facility] ED on 10/3 due to erratic behavior and was involuntarily committed. Hospice was notified by family on 10/4 that patient was "not at home and would not be for a while".  He has been evaluated by the hospital psychiatrist and IVC was rescinded on 10/4.  Visited patient at bedside, he was calm and interactive. Speech difficult to understand. Writer spoke with [employer] and with patient's sister [name].  Plan at this time is for placement ina group home or long term care facility, with continued hospice services. Will continue to follow and provide support to patient and communicate with both hospice and hospital staff regarding discharge plan. Thank you. Flo Shanks BSN, RN, [place] [phone]

## 2020-07-25 NOTE — ED Provider Notes (Signed)
Emergency Medicine Observation Re-evaluation Note  Keith Roberson is a 69 y.o. male, seen on rounds today.  Pt initially presented to the ED for complaints of Psychiatric Evaluation Currently, the patient is, no complaints.  Physical Exam  BP (!) 142/100 (BP Location: Left Arm)   Pulse 72   Temp 97.7 F (36.5 C) (Oral)   Resp 18   Ht 5\' 11"  (1.803 m)   Wt 73.5 kg   SpO2 100%   BMI 22.59 kg/m  Physical Exam  General: No apparent distress HEENT: moist mucous membranes CV: RRR Pulm: Normal WOB GI: soft and non tender MSK: no edema or cyanosis Neuro: face symmetric, moving all extremities   ED Course / MDM  EKG:    I have reviewed the labs performed to date as well as medications administered while in observation. No acute changes overnight or new labs this morning.  Plan  Current plan is for psychiatric placement.Alfred Levins, [place], MD 07/25/20 [phone]

## 2020-07-26 NOTE — ED Notes (Signed)
Pt given new scrubs and things to clean self up with. Did not want to get in shower.

## 2020-07-26 NOTE — TOC Transition Note (Signed)
Transition of Care [facility]) - CM/SW Discharge Note   Patient Details  Name: Keith Roberson MRN: [id number] Date of Birth: 1951/06/27  Transition [facility] Care [facility]) CM/SW Contact:  Ova Freshwater Phone Number: [phone] 07/26/2020, 11:24 AM   Clinical Narrative:     Patient d/c home with legal guardians Keith Roberson and Keith Roberson.  EDP/ED Staff notified.  TOC consult completed.   Final next level of care: Group Home Barriers to Discharge: ED Unsafe disposition, ED Facility/Family Refusing to Allow Patient to Return, ED No Barriers   Patient Goals and CMS Choice Patient states their goals for this hospitalization and ongoing recovery are:: Family states they are unable to care for patient and maintain his safety due to his behaviors. CMS Medicare.gov Compare Post Acute Care list provided to::  Keith Roberson (Sister) [phone]) Choice offered to / list presented to :  Keith Roberson (Sister) [phone])  Discharge Placement                       Discharge Plan and Services In-house Referral: Clinical Social Work                                   Social Determinants of Health (SDOH) Interventions     Readmission Risk Interventions No flowsheet data found.

## 2020-07-26 NOTE — ED Provider Notes (Signed)
Emergency Medicine Observation Re-evaluation Note  Keith Roberson is a 69 y.o. male, seen on rounds today.  Pt initially presented to the ED for complaints of Psychiatric Evaluation Currently, the patient is, no complaints this morning.  Physical Exam  BP (!) 144/88   Pulse 97   Temp 97.9 F (36.6 C) (Oral)   Resp 20   Ht 5\' 11"  (1.803 m)   Wt 73.5 kg   SpO2 100%   BMI 22.59 kg/m  Physical Exam  General: No apparent distress HEENT: moist mucous membranes CV: RRR Pulm: Normal WOB GI: soft and non tender MSK: no edema or cyanosis Neuro: face symmetric, moving all extremities    ED Course / MDM  EKG:    I have reviewed the labs performed to date as well as medications administered while in observation.  No acute changes overnight or new labs this morning.  Plan  Current plan is for placement.    Alfred Levins, [place], MD 07/26/20 [phone]

## 2020-07-26 NOTE — ED Notes (Signed)
Breakfast tray with juice was given.
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
## 2020-07-26 NOTE — ED Notes (Signed)
Pt is very manic. Moving mattress to the floor, sitting on the floor, messing with the bed frame. Pt is now sitting on his mattress on the floor. Pt speaks word salad and is very confused.  Will keep monorting his safety at this time.  lw edt

## 2020-07-26 NOTE — ED Notes (Signed)
Pt was lifting bed and then dropping it and making a loud noise. Tech and RN staff went into room to assess the situation. Decided removing bed frame was the most  Logical and safe plan at this time as pt is very confused and restless.  Bed frame is in ambulance bay

## 2020-07-26 NOTE — ED Notes (Signed)
Pt not at capacity to sign for self. D/c paperwork given to legal guardian/sister. Wheeled out to car.

## 2020-07-26 NOTE — ED Notes (Signed)
Gave patient a bed bath and a change of clothes.

## 2020-07-26 NOTE — ED Notes (Signed)
Per social work, family coming to pick patient up

## 2020-09-10 ENCOUNTER — Other Ambulatory Visit: Payer: Self-pay

## 2020-09-10 ENCOUNTER — Observation Stay: Payer: Self-pay

## 2020-09-10 ENCOUNTER — Encounter (HOSPITAL_COMMUNITY): Payer: Self-pay | Admitting: Internal Medicine

## 2020-09-10 ENCOUNTER — Observation Stay (HOSPITAL_COMMUNITY)
Admission: AD | Admit: 2020-09-10 | Discharge: 2020-09-11 | Disposition: A | Discharge status: Home / Self Care | Source: Other Acute Inpatient Hospital | Attending: Student | Admitting: Student

## 2020-09-10 DIAGNOSIS — R221 Localized swelling, mass and lump, neck: Secondary | ICD-10-CM | POA: Diagnosis not present

## 2020-09-10 DIAGNOSIS — R200 Anesthesia of skin: Secondary | ICD-10-CM | POA: Diagnosis not present

## 2020-09-10 DIAGNOSIS — F17210 Nicotine dependence, cigarettes, uncomplicated: Secondary | ICD-10-CM | POA: Insufficient documentation

## 2020-09-10 DIAGNOSIS — J449 Chronic obstructive pulmonary disease, unspecified: Secondary | ICD-10-CM | POA: Diagnosis not present

## 2020-09-10 DIAGNOSIS — Z79899 Other long term (current) drug therapy: Secondary | ICD-10-CM | POA: Insufficient documentation

## 2020-09-10 DIAGNOSIS — F0390 Unspecified dementia without behavioral disturbance: Secondary | ICD-10-CM | POA: Insufficient documentation

## 2020-09-10 DIAGNOSIS — C4442 Squamous cell carcinoma of skin of scalp and neck: Principal | ICD-10-CM | POA: Insufficient documentation

## 2020-09-10 LAB — CBC WITH DIFFERENTIAL/PLATELET
Abs Immature Granulocytes: 0.03 10*3/uL (ref 0.00–0.07)
Basophils Absolute: 0 10*3/uL (ref 0.0–0.1)
Basophils Relative: 0 %
Eosinophils Absolute: 0.1 10*3/uL (ref 0.0–0.5)
Eosinophils Relative: 1 %
HCT: 41.8 % (ref 39.0–52.0)
Hemoglobin: 13.4 g/dL (ref 13.0–17.0)
Immature Granulocytes: 0 %
Lymphocytes Relative: 30 %
Lymphs Abs: 2.3 10*3/uL (ref 0.7–4.0)
MCH: 34 pg (ref 26.0–34.0)
MCHC: 32.1 g/dL (ref 30.0–36.0)
MCV: 106.1 fL — ABNORMAL HIGH (ref 80.0–100.0)
Monocytes Absolute: 0.5 10*3/uL (ref 0.1–1.0)
Monocytes Relative: 6 %
Neutro Abs: 4.8 10*3/uL (ref 1.7–7.7)
Neutrophils Relative %: 63 %
Platelets: 244 10*3/uL (ref 150–400)
RBC: 3.94 MIL/uL — ABNORMAL LOW (ref 4.22–5.81)
RDW: 14.9 % (ref 11.5–15.5)
WBC: 7.7 10*3/uL (ref 4.0–10.5)
nRBC: 0 % (ref 0.0–0.2)

## 2020-09-10 LAB — COMPREHENSIVE METABOLIC PANEL
ALT: 7 U/L (ref 0–44)
AST: 17 U/L (ref 15–41)
Albumin: 4.2 g/dL (ref 3.5–5.0)
Alkaline Phosphatase: 74 U/L (ref 38–126)
Anion gap: 11 (ref 5–15)
BUN: 15 mg/dL (ref 8–23)
CO2: 25 mmol/L (ref 22–32)
Calcium: 9.4 mg/dL (ref 8.9–10.3)
Chloride: 102 mmol/L (ref 98–111)
Creatinine, Ser: 0.93 mg/dL (ref 0.61–1.24)
GFR, Estimated: >60 mL/min (ref >60)
Glucose, Bld: 95 mg/dL (ref 70–99)
Potassium: 4.3 mmol/L (ref 3.5–5.1)
Sodium: 138 mmol/L (ref 135–145)
Total Bilirubin: 0.6 mg/dL (ref 0.3–1.2)
Total Protein: 7.9 g/dL (ref 6.5–8.1)

## 2020-09-10 MED ORDER — NICOTINE 14 MG/24HR TD PT24
14.0000 mg | MEDICATED_PATCH | Freq: Every day | TRANSDERMAL | Status: DC
  Administered 2020-09-10 – 2020-09-11 (×2): 14 mg via TRANSDERMAL
  Filled 2020-09-10 (×2): qty 1

## 2020-09-10 NOTE — H&P (Signed)
History and Physical    NEHEMYAH FOUSHEE Roberson:[id number] DOB: November 23, 1950 DOA: 09/10/2020  PCP: Abran Richard, MD  Patient coming from: [facility]  Chief Complaint: Right neck mass  HPI: Keith Roberson is a 69 y.o. male with medical history significant of right neck mass. Presenting with right neck mass. He reports that he has had worsening swelling of a known neck mass over the last 3 weeks. It has resulted in some intermittent numbness in he right ear. He otherwise has no symptoms. He has not tried any treatment to make this sensation better. He denies any alleviating or aggravating factors. He was previously offered help in work up of this mass, but he declined. He now feels it is time to figure out what is happening so he went to the hospital at [facility]. A CT showing a neck mass was obtained there. He was transferred to [facility].    Review of Systems:  Denies CP, palpitations, dyspnea, stridor, difficulty swallowing, neck pain, headache. Review of systems is otherwise negative for all not mentioned in HPI.   PMHx Past Medical History:  Diagnosis Date  . Cancer ([place])    lung  . COPD (chronic obstructive pulmonary disease) ([place])   . Dementia ([place])   . Dyspnea   . Neck mass 04/21/2020  . Seizures ([place])     PSHx No past surgical history on file.  SocHx  reports that he has been smoking cigarettes. He has been smoking about 0.50 packs per day. He has never used smokeless tobacco. He reports current alcohol use. He reports that he does not use drugs.  No Known Allergies  FamHx Family History  Problem Relation Age of Onset  . Breast cancer Mother   . Prostate cancer Father     Prior to Admission medications   Medication Sig Start Date End Date Taking? Authorizing Provider  albuterol (VENTOLIN HFA) 108 (90 Base) MCG/ACT inhaler Inhale 2 puffs into the lungs every 4 (four) hours as needed for wheezing or shortness of breath. 05/28/20   Wyvonnia Dusky, MD    Physical Exam: Vitals:     09/10/20 1015  BP: (!) 149/84  Pulse: 68  Resp: 14  Temp: 97.9 F (36.6 C)  TempSrc: Oral  SpO2: 100%    General: 69 y.o. male resting in bed in NAD Eyes: PERRL, normal sclera ENMT: Nares patent w/o discharge, orophaynx clear, dentition normal, ears w/o discharge/lesions/ulcers Neck: right side neck mass just inferior and anterior to right ear, non-tender to palpation, non-erythematous, no drainage noted Cardiovascular: RRR, +S1, S2, no m/g/r, equal pulses throughout Respiratory: CTABL, no w/r/r, normal WOB GI: BS+, NDNT, no masses noted, no organomegaly noted MSK: No e/c/c Skin: No rashes, bruises, ulcerations noted Neuro: A&O x 3, no focal deficits Psyc: Appropriate interaction and affect, calm/cooperative  Labs on Admission: I have personally reviewed following labs and imaging studies  CBC: No results for input(s): WBC, NEUTROABS, HGB, HCT, MCV, PLT in the last 168 hours. Basic Metabolic Panel: No results for input(s): NA, K, CL, CO2, GLUCOSE, BUN, CREATININE, CALCIUM, MG, PHOS in the last 168 hours. GFR: CrCl cannot be calculated (Patient's most recent lab result is older than the maximum 21 days allowed.). Liver Function Tests: No results for input(s): AST, ALT, ALKPHOS, BILITOT, PROT, ALBUMIN in the last 168 hours. No results for input(s): LIPASE, AMYLASE in the last 168 hours. No results for input(s): AMMONIA in the last 168 hours. Coagulation Profile: No results for input(s): INR, PROTIME in the  last 168 hours. Cardiac Enzymes: No results for input(s): CKTOTAL, CKMB, CKMBINDEX, TROPONINI in the last 168 hours. BNP (last 3 results) No results for input(s): PROBNP in the last 8760 hours. HbA1C: No results for input(s): HGBA1C in the last 72 hours. CBG: No results for input(s): GLUCAP in the last 168 hours. Lipid Profile: No results for input(s): CHOL, HDL, LDLCALC, TRIG, CHOLHDL, LDLDIRECT in the last 72 hours. Thyroid Function Tests: No results for input(s):  TSH, T4TOTAL, FREET4, T3FREE, THYROIDAB in the last 72 hours. Anemia Panel: No results for input(s): VITAMINB12, FOLATE, FERRITIN, TIBC, IRON, RETICCTPCT in the last 72 hours. Urine analysis:    Component Value Date/Time   COLORURINE YELLOW 10/04/2019 1301   APPEARANCEUR CLEAR 10/04/2019 1301   LABSPEC 1.044 (H) 10/04/2019 1301   PHURINE 8.0 10/04/2019 1301   GLUCOSEU NEGATIVE 10/04/2019 1301   HGBUR NEGATIVE 10/04/2019 1301   BILIRUBINUR NEGATIVE 10/04/2019 1301   KETONESUR NEGATIVE 10/04/2019 1301   PROTEINUR 30 (A) 10/04/2019 1301   UROBILINOGEN 0.2 10/26/2008 1802   NITRITE NEGATIVE 10/04/2019 1301   LEUKOCYTESUR NEGATIVE 10/04/2019 1301    Radiological Exams on Admission: No results found.  Assessment/Plan Right neck mass     - admit to obs, med-surg     - a right mass was previously noted on CT on 04/17/20     - a CT at [facility] redemonstrated the neck mass; we are working on getting the imaging from that exam (not just the report); if we are unable to obtain the imaging, we will need to rpt CT neck w/ contrast so that IR can have a resource for Bx     - consult IR for Bx once imaging obtained     - respiratory status is good, his swallowing status is good; monitor   Tobacco abuse     - counseled again further tobacco use     - add nicotine patch  DVT prophylaxis: SCDs  Code Status: FULL  Family Communication: None at bedside.  Consults called: None    Status is: Observation  The patient remains OBS appropriate and will d/c before 2 midnights.  Dispo: The patient is from: Home              Anticipated d/c is to: Home              Anticipated d/c date is: 1 day              Patient currently is medically stable to d/c.  Jonnie Finner DO [facility]  If 7PM-7AM, please contact night-coverage [email or web address]  09/10/2020, 11:11 AM

## 2020-09-10 NOTE — Plan of Care (Signed)
  Problem: Education: Goal: Knowledge of General Education information will improve Description Including pain rating scale, medication(s)/side effects and non-pharmacologic comfort measures Outcome: Progressing   Problem: Health Behavior/Discharge Planning: Goal: Ability to manage health-related needs will improve Outcome: Progressing   

## 2020-09-10 NOTE — Progress Notes (Signed)
Pt arrived via EMS from [facility], [place], pt noted with AMS at times, there are times conversation are indifferent. Pt oriented to name, place (unable to state location of hospital), time and some situational question he is slow to respond and have trouble remembering. Pt reports rt neck discomfort to the extent of lower anterior/posrterior ear numbness. Pt states this has been going on for a couple of weeks. Pt sister Keith Roberson who has Guardianship, reports, at d/c from the the previous hospital, the ED MD instructed family to bring pt to nearest ED if numbness occur as pt maybe at risk for bleeding from veins and arteries of his neck. Md made aware and assessed area. Request x-rays reports from [place], reached out to [facility] to fax over film , etc per MD request. Pt currently restless, had to remind pt this is a non smoking facility as pt was clothed and planned to leave the hospital to smoke, again MD updated and ordered received. Will cont to monitor pt as needed. SRP, RN

## 2020-09-11 ENCOUNTER — Observation Stay (HOSPITAL_COMMUNITY)

## 2020-09-11 DIAGNOSIS — F0390 Unspecified dementia without behavioral disturbance: Secondary | ICD-10-CM

## 2020-09-11 DIAGNOSIS — F17200 Nicotine dependence, unspecified, uncomplicated: Secondary | ICD-10-CM

## 2020-09-11 DIAGNOSIS — C4442 Squamous cell carcinoma of skin of scalp and neck: Secondary | ICD-10-CM | POA: Diagnosis not present

## 2020-09-11 DIAGNOSIS — R221 Localized swelling, mass and lump, neck: Secondary | ICD-10-CM

## 2020-09-11 DIAGNOSIS — J449 Chronic obstructive pulmonary disease, unspecified: Secondary | ICD-10-CM

## 2020-09-11 LAB — CBC
HCT: 36.4 % — ABNORMAL LOW (ref 39.0–52.0)
Hemoglobin: 11.9 g/dL — ABNORMAL LOW (ref 13.0–17.0)
MCH: 34.3 pg — ABNORMAL HIGH (ref 26.0–34.0)
MCHC: 32.7 g/dL (ref 30.0–36.0)
MCV: 104.9 fL — ABNORMAL HIGH (ref 80.0–100.0)
Platelets: 210 10*3/uL (ref 150–400)
RBC: 3.47 MIL/uL — ABNORMAL LOW (ref 4.22–5.81)
RDW: 14.8 % (ref 11.5–15.5)
WBC: 6.6 10*3/uL (ref 4.0–10.5)
nRBC: 0 % (ref 0.0–0.2)

## 2020-09-11 LAB — COMPREHENSIVE METABOLIC PANEL
ALT: 8 U/L (ref 0–44)
AST: 14 U/L — ABNORMAL LOW (ref 15–41)
Albumin: 3.5 g/dL (ref 3.5–5.0)
Alkaline Phosphatase: 64 U/L (ref 38–126)
Anion gap: 10 (ref 5–15)
BUN: 20 mg/dL (ref 8–23)
CO2: 24 mmol/L (ref 22–32)
Calcium: 9 mg/dL (ref 8.9–10.3)
Chloride: 103 mmol/L (ref 98–111)
Creatinine, Ser: 1.03 mg/dL (ref 0.61–1.24)
GFR, Estimated: >60 mL/min (ref >60)
Glucose, Bld: 89 mg/dL (ref 70–99)
Potassium: 4.1 mmol/L (ref 3.5–5.1)
Sodium: 137 mmol/L (ref 135–145)
Total Bilirubin: 0.5 mg/dL (ref 0.3–1.2)
Total Protein: 6.7 g/dL (ref 6.5–8.1)

## 2020-09-11 LAB — PROTIME-INR
INR: 1 (ref 0.8–1.2)
Prothrombin Time: 12.8 seconds (ref 11.4–15.2)

## 2020-09-11 MED ORDER — FENTANYL CITRATE (PF) 100 MCG/2ML IJ SOLN
INTRAMUSCULAR | Status: AC | PRN
Start: 2020-09-11 — End: 2020-09-11
  Administered 2020-09-11 (×2): 50 ug via INTRAVENOUS

## 2020-09-11 MED ORDER — LIDOCAINE HCL 1 % IJ SOLN
INTRAMUSCULAR | Status: AC
  Filled 2020-09-11: qty 20

## 2020-09-11 MED ORDER — FENTANYL CITRATE (PF) 100 MCG/2ML IJ SOLN
INTRAMUSCULAR | Status: AC
  Filled 2020-09-11: qty 2

## 2020-09-11 MED ORDER — ACETAMINOPHEN 500 MG PO TABS: 500.0000 mg | ORAL_TABLET | Freq: Three times a day (TID) | ORAL | Status: AC

## 2020-09-11 MED ORDER — MIDAZOLAM HCL 2 MG/2ML IJ SOLN
INTRAMUSCULAR | Status: AC | PRN
  Administered 2020-09-11 (×2): 1 mg via INTRAVENOUS

## 2020-09-11 MED ORDER — MIDAZOLAM HCL 2 MG/2ML IJ SOLN
INTRAMUSCULAR | Status: AC
  Filled 2020-09-11: qty 2

## 2020-09-11 MED ORDER — LIDOCAINE HCL (PF) 1 % IJ SOLN
INTRAMUSCULAR | Status: AC | PRN
  Administered 2020-09-11: 5 mL

## 2020-09-11 NOTE — Progress Notes (Signed)
Pt discharged to home, instruction reviewed over the phone to sister and Legal Real. Acknowledged understanding. Willl pick up pt late this PM. SRP, RN

## 2020-09-11 NOTE — Consult Note (Signed)
Chief Complaint: Patient was seen in consultation today for image guided right neck mass/lymph node biopsy  Referring Physician(s): Kyle,T  Supervising Physician: Daryll Brod  Patient Status: [facility] - In-pt  History of Present Illness: Keith Roberson is a 69 y.o. male smoker with past medical history of COPD, dementia, seizures, and right tonsillar/neck mass which according to patient's sister has been previously biopsied at [facility] in 2019 with diagnosis of "cancer"-no records available.  Patient declined any further work-up at that time.  He recently presented to hospital in [place] to pursue further treatment of this neck mass and was subsequently transferred to [facility] to undergo right neck mass biopsy.  He currently denies pain in the region but does have some intermittent numbness in the right ear.  Past Medical History:  Diagnosis Date  . Cancer ([place])    lung  . COPD (chronic obstructive pulmonary disease) ([place])   . Dementia ([place])   . Dyspnea   . Neck mass 04/21/2020  . Seizures ([place])     History reviewed. No pertinent surgical history.  Allergies: Patient has no known allergies.  Medications: Prior to Admission medications   Medication Sig Start Date End Date Taking? Authorizing Provider  albuterol (VENTOLIN HFA) 108 (90 Base) MCG/ACT inhaler Inhale 2 puffs into the lungs every 4 (four) hours as needed for wheezing or shortness of breath. 05/28/20  Yes Wyvonnia Dusky, MD     Family History  Problem Relation Age of Onset  . Breast cancer Mother   . Prostate cancer Father     Social History   Socioeconomic History  . Marital status: Divorced    Spouse name: Not on file  . Number of children: 1  . Years of education: Not on file  . Highest education level: Not on file  Occupational History  . Occupation: DISABLED  Tobacco Use  . Smoking status: Current Every Day Smoker    Packs/day: 0.50    Types: Cigarettes  . Smokeless tobacco: Never Used   Vaping Use  . Vaping Use: Never used  Substance and Sexual Activity  . Alcohol use: Yes    Comment: occ  . Drug use: No  . Sexual activity: Not Currently  Other Topics Concern  . Not on file  Social History Narrative  . Not on file   Social Determinants of Health   Financial Resource Strain: Low Risk   . Difficulty of Paying Living Expenses: Not hard at all  Food Insecurity: No Food Insecurity  . Worried About Charity fundraiser in the Last Year: Never true  . Ran Out of Food in the Last Year: Never true  Transportation Needs: Unmet Transportation Needs  . Lack of Transportation (Medical): Yes  . Lack of Transportation (Non-Medical): Yes  Physical Activity: Inactive  . Days of Exercise per Week: 0 days  . Minutes of Exercise per Session: 0 min  Stress: No Stress Concern Present  . Feeling of Stress : Not at all  Social Connections: Socially Isolated  . Frequency of Communication with Friends and Family: Once a week  . Frequency of Social Gatherings with Friends and Family: Once a week  . Attends Religious Services: More than 4 times per year  . Active Member of Clubs or Organizations: No  . Attends Archivist Meetings: Never  . Marital Status: Divorced      Review of Systems currently denies fever, headache, chest pain, dyspnea, cough, abdominal/back pain, nausea, vomiting or bleeding.  Vital Signs: BP 110/75   Pulse 79   Temp 97.9 F (36.6 C)   Resp 18   Ht 5\' 11"  (1.803 m)   Wt 140 lb (63.5 kg)   SpO2 100%   BMI 19.53 kg/m   Physical Exam awake, answers few simple questions okay.  Chest clear to auscultation bilaterally.  Heart with regular rate and rhythm.  Abdomen soft, positive bowel sounds, nontender.  No lower extremity edema.  Right-sided neck/nodal mass noted inferior and anterior to the right ear ,nontender to palpation  Imaging: CT OUTSIDE FILMS HEAD/FACE  Result Date: 09/10/2020 This examination belongs to an outside facility and is  stored here for comparison purposes only.  Contact the originating outside institution for any associated report or interpretation.  CT OUTSIDE FILMS SOFT TISSUE NECK  Result Date: 09/10/2020 This examination belongs to an outside facility and is stored here for comparison purposes only.  Contact the originating outside institution for any associated report or interpretation.   Labs:  CBC: Recent Labs    05/28/20 1648 07/23/20 1555 09/10/20 1250 09/11/20 0527  WBC 7.4 7.8 7.7 6.6  HGB 14.6 13.0 13.4 11.9*  HCT 43.4 38.6* 41.8 36.4*  PLT 197 271 244 210    COAGS: Recent Labs    09/11/20 0527  INR 1.0    BMP: Recent Labs    04/17/20 1709 04/17/20 1709 05/10/20 1340 05/10/20 1340 05/28/20 1648 07/23/20 1555 09/10/20 1250 09/11/20 0527  NA 136   < > 138   < > 139 141 138 137  K 4.1   < > 5.0   < > 5.0 4.2 4.3 4.1  CL 101   < > 103   < > 102 105 102 103  CO2 27   < > 24   < > 24 26 25 24   GLUCOSE 94   < > 80   < > 84 82 95 89  BUN 10   < > 11   < > 12 9 15 20   CALCIUM 8.7*   < > 9.0   < > 9.3 8.7* 9.4 9.0  CREATININE 1.06   < > 0.93   < > 1.15 0.97 0.93 1.03  GFRNONAA >60   < > >60   < > >60 >60 >60 >60  GFRAA >60  --  >60  --  >60 >60  --   --    < > = values in this interval not displayed.    LIVER FUNCTION TESTS: Recent Labs    10/04/19 1226 07/23/20 1555 09/10/20 1250 09/11/20 0527  BILITOT 1.4* 0.6 0.6 0.5  AST 28 17 17  14*  ALT 13 8 7 8   ALKPHOS 90 80 74 64  PROT 6.6 7.2 7.9 6.7  ALBUMIN 3.6 3.8 4.2 3.5    TUMOR MARKERS: No results for input(s): AFPTM, CEA, CA199, CHROMGRNA in the last 8760 hours.  Assessment and Plan: 69 y.o. male smoker with past medical history of COPD, dementia, seizures, and right tonsillar/neck mass which according to patient's sister has been previously biopsied at [facility] in 2019 with diagnosis of "cancer"-no records available.  Patient declined any further work-up at that time.  He recently presented to hospital in  [place] to pursue further treatment of this neck mass and was subsequently transferred to [facility] to undergo right neck mass biopsy.  Imaging studies have been reviewed by Dr.Shick. Risks and benefits of procedure was discussed with the patient and/or patient's family(sister and guardian Keith Roberson) including,  but not limited to bleeding, infection, damage to adjacent structures or low yield requiring additional tests.  All of the questions were answered and there is agreement to proceed.  Consent signed and in chart.  Procedure scheduled for later this afternoon.   Thank you for this interesting consult.  I greatly enjoyed meeting Keith Roberson and look forward to participating in their care.  A copy of this report was sent to the requesting provider on this date.  Electronically Signed: D. Rowe Robert, PA-C 09/11/2020, 10:46 AM   I spent a total of 30 minutes   in face to face in clinical consultation, greater than 50% of which was counseling/coordinating care for image guided right neck/nodal mass biopsy

## 2020-09-11 NOTE — Plan of Care (Signed)
  Problem: Education: Goal: Knowledge of General Education information will improve Description Including pain rating scale, medication(s)/side effects and non-pharmacologic comfort measures Outcome: Progressing   Problem: Health Behavior/Discharge Planning: Goal: Ability to manage health-related needs will improve Outcome: Progressing   

## 2020-09-11 NOTE — Procedures (Addendum)
Interventional Radiology Procedure Note  Procedure:US BX RT NECK NODAL MASS  Complications: None  Estimated Blood Loss:  MIN  Findings: 18 G CORES X 4    M. Daryll Brod, MD

## 2020-09-11 NOTE — Discharge Summary (Signed)
Physician Discharge Summary  Keith Roberson YFV:[id number] DOB: Jun 10, 1951 DOA: 09/10/2020  PCP: Abran Richard, MD  Admit date: 09/10/2020 Discharge date: 09/11/2020  Admitted From: Home Disposition: Home  Recommendations for Outpatient Follow-up:  1. Follow ups as below. 2. Please obtain CBC/BMP/Mag at follow up 3. Please follow up on the following pending results: Right leg muscle biopsy  Home Health: None Equipment/Devices: None  Discharge Condition: Stable CODE STATUS: Full code   Follow-up Information    Abran Richard, MD. Schedule an appointment as soon as possible for a visit in 1 week(s).   Specialty: Internal Medicine Contact information: [address] [place] [address] [phone]                Hospital Course: 69 year old male with history of right neck mass, COPD, dementia, seizure and tobacco use disorder presented to [facility] with worsening right neck mass.  Patient had a CT scan at [facility] that confirmed right neck mass, and transferred to [facility] for further evaluation.  Patient was asymptomatic from this other than some intermittent numbness in the right ear.  Has no dyspnea, stridor, dysphagia, odynophagia, neck pain or headache.  In ED, hemodynamically stable.  100% on RA.  CMP and CBC with differential without significant finding other than macrocytosis.  Patient underwent core biopsy of right neck mass and left IJ PowerPort placement.  Pathology sent.  Discharged home to follow-up with PCP and oncology.   Attempted to call patient's sister for update but no answer.  See individual problem list below for more hospital course.  Discharge Diagnoses:  Right neck mass: firm right neck mass measuring about 2 inch x 1.5 inch.  No tenderness, erythema or fluid loculation.  Full range of motion in his neck. -Ultrasound core biopsy on 09/11/2020 -Follow pathology. -Ambulatory referral to oncology   Chronic  COPD: Stable. -Continue home albuterol  History of seizure: Not on medication.  Dementia without behavioral disturbance.  Not on medication.  Tobacco use disorder -Encourage cessation.   Body mass index is 19.53 kg/m.            Discharge Exam: Vitals:   09/11/20 1525 09/11/20 1530  BP: 117/78 119/78  Pulse: 70 74  Resp: 14 12  Temp:    SpO2: 100% 100%    GENERAL: No apparent distress.  Nontoxic. HEENT: MMM.  Some irregularity behind the right soft palate NECK: From none mobile right neck mass measuring about 2 inch x 1.5 inch.  No tenderness RESP: On room air.  No IWOB.  Fair aeration bilaterally. CVS:  RRR. Heart sounds normal.  ABD/GI/GU: Bowel sounds present. Soft. Non tender.  MSK/EXT:  Moves extremities. No apparent deformity. No edema.  SKIN: no apparent skin lesion or wound NEURO: Awake, alert and oriented x2.  No apparent focal neuro deficit. PSYCH: Calm. Normal affect.  Discharge Instructions  Discharge Instructions    Ambulatory referral to Hematology / Oncology   Complete by: As directed    Right neck mass concerning for malignancy. S/p US-guided biopsy by IR at [facility] long   Call MD for:   Complete by: As directed    Difficulty swallowing   Call MD for:  difficulty breathing, headache or visual disturbances   Complete by: As directed    Call MD for:  severe uncontrolled pain   Complete by: As directed    Diet general   Complete by: As directed    Discharge instructions   Complete by:  As directed    It has been a pleasure taking care of you!  You were hospitalized due to right neck mass.  You had a biopsy done.  It may take 3 to 4 days for the results.  Someone will get in touch with you about the results, and the plan going forward.   Take care,   Increase activity slowly   Complete by: As directed      Allergies as of 09/11/2020   No Known Allergies     Medication List    TAKE these medications   acetaminophen 500 MG  tablet Commonly known as: TYLENOL Take 1 tablet (500 mg total) by mouth every 8 (eight) hours.   albuterol 108 (90 Base) MCG/ACT inhaler Commonly known as: VENTOLIN HFA Inhale 2 puffs into the lungs every 4 (four) hours as needed for wheezing or shortness of breath.       Consultations:  Interventional radiology  Procedures/Studies:  Ultrasound guided core biopsy of right neck mass  Left IJ PowerPort placement   [place] CORE BIOPSY (LYMPH NODES)  Result Date: 09/11/2020 INDICATION: Right neck nodal mass EXAM: ULTRASOUND GUIDED CORE BIOPSY OF RIGHT NECK NODAL MASS MEDICATIONS: 1% LIDOCAINE LOCAL ANESTHESIA/SEDATION: Versed 1.0mg  IV; Fentanyl 32mcg IV; Moderate Sedation Time:  10 MINUTES The patient was continuously monitored during the procedure by the interventional radiology nurse under my direct supervision. FLUOROSCOPY TIME:  Fluoroscopy Time: NONE. COMPLICATIONS: None immediate. PROCEDURE: The procedure, risks, benefits, and alternatives were explained to the patient. Questions regarding the procedure were encouraged and answered. The patient understands and consents to the procedure. Previous imaging reviewed. Preliminary ultrasound performed. Right neck nodal mass was localized and marked. Under sterile conditions and local anesthesia, 18 gauge core biopsy was advanced to the right neck irregular nodal mass. 18 gauge core biopsies obtained. Samples placed in saline. Postprocedure imaging demonstrates no hemorrhage or hematoma. Patient tolerated biopsy well. FINDINGS: Imaging confirms needle placed in the right neck nodal mass for core biopsy IMPRESSION: Successful ultrasound right neck nodal mass 18 gauge core biopsies Electronically Signed   By: Jerilynn Mages.  Shick M.D.   On: 09/11/2020 16:01   CT OUTSIDE FILMS HEAD/FACE  Result Date: 09/10/2020 This examination belongs to an outside facility and is stored here for comparison purposes only.  Contact the originating outside institution for any  associated report or interpretation.  CT OUTSIDE FILMS SOFT TISSUE NECK  Result Date: 09/10/2020 This examination belongs to an outside facility and is stored here for comparison purposes only.  Contact the originating outside institution for any associated report or interpretation.      The results of significant diagnostics from this hospitalization (including imaging, microbiology, ancillary and laboratory) are listed below for reference.     Microbiology: No results found for this or any previous visit (from the past 240 hour(s)).   Labs: BNP (last 3 results) Recent Labs    10/04/19 1226 05/28/20 1649  BNP 154.0* 87.5   Basic Metabolic Panel: Recent Labs  Lab 09/10/20 1250 09/11/20 0527  NA 138 137  K 4.3 4.1  CL 102 103  CO2 25 24  GLUCOSE 95 89  BUN 15 20  CREATININE 0.93 1.03  CALCIUM 9.4 9.0   Liver Function Tests: Recent Labs  Lab 09/10/20 1250 09/11/20 0527  AST 17 14*  ALT 7 8  ALKPHOS 74 64  BILITOT 0.6 0.5  PROT 7.9 6.7  ALBUMIN 4.2 3.5   No results for input(s): LIPASE, AMYLASE in the last 168 hours.  No results for input(s): AMMONIA in the last 168 hours. CBC: Recent Labs  Lab 09/10/20 1250 09/11/20 0527  WBC 7.7 6.6  NEUTROABS 4.8  --   HGB 13.4 11.9*  HCT 41.8 36.4*  MCV 106.1* 104.9*  PLT 244 210   Cardiac Enzymes: No results for input(s): CKTOTAL, CKMB, CKMBINDEX, TROPONINI in the last 168 hours. BNP: Invalid input(s): POCBNP CBG: No results for input(s): GLUCAP in the last 168 hours. D-Dimer No results for input(s): DDIMER in the last 72 hours. Hgb A1c No results for input(s): HGBA1C in the last 72 hours. Lipid Profile No results for input(s): CHOL, HDL, LDLCALC, TRIG, CHOLHDL, LDLDIRECT in the last 72 hours. Thyroid function studies No results for input(s): TSH, T4TOTAL, T3FREE, THYROIDAB in the last 72 hours.  Invalid input(s): FREET3 Anemia work up No results for input(s): VITAMINB12, FOLATE, FERRITIN, TIBC, IRON,  RETICCTPCT in the last 72 hours. Urinalysis    Component Value Date/Time   COLORURINE YELLOW 10/04/2019 1301   APPEARANCEUR CLEAR 10/04/2019 1301   LABSPEC 1.044 (H) 10/04/2019 1301   PHURINE 8.0 10/04/2019 1301   GLUCOSEU NEGATIVE 10/04/2019 1301   HGBUR NEGATIVE 10/04/2019 1301   BILIRUBINUR NEGATIVE 10/04/2019 1301   KETONESUR NEGATIVE 10/04/2019 1301   PROTEINUR 30 (A) 10/04/2019 1301   UROBILINOGEN 0.2 10/26/2008 1802   NITRITE NEGATIVE 10/04/2019 1301   LEUKOCYTESUR NEGATIVE 10/04/2019 1301   Sepsis Labs Invalid input(s): PROCALCITONIN,  WBC,  LACTICIDVEN   Time coordinating discharge: 25 minutes  SIGNED:  Mercy Riding, MD  [facility] 09/11/2020, 4:50 PM  If 7PM-7AM, please contact night-coverage [email or web address]

## 2020-09-19 ENCOUNTER — Other Ambulatory Visit (HOSPITAL_COMMUNITY): Payer: Medicare HMO

## 2020-09-19 ENCOUNTER — Other Ambulatory Visit: Payer: Self-pay

## 2020-09-19 ENCOUNTER — Inpatient Hospital Stay (HOSPITAL_COMMUNITY): Payer: Medicare HMO | Attending: Hematology | Admitting: Hematology

## 2020-09-19 VITALS — BP 141/77 | HR 93 | Temp 97.3°F | Resp 18 | Wt 147.6 lb

## 2020-09-19 DIAGNOSIS — Z79899 Other long term (current) drug therapy: Secondary | ICD-10-CM | POA: Insufficient documentation

## 2020-09-19 DIAGNOSIS — F17210 Nicotine dependence, cigarettes, uncomplicated: Secondary | ICD-10-CM | POA: Diagnosis not present

## 2020-09-19 DIAGNOSIS — C4442 Squamous cell carcinoma of skin of scalp and neck: Secondary | ICD-10-CM | POA: Diagnosis not present

## 2020-09-19 DIAGNOSIS — R221 Localized swelling, mass and lump, neck: Secondary | ICD-10-CM

## 2020-09-19 DIAGNOSIS — C099 Malignant neoplasm of tonsil, unspecified: Secondary | ICD-10-CM | POA: Diagnosis present

## 2020-09-19 DIAGNOSIS — Z803 Family history of malignant neoplasm of breast: Secondary | ICD-10-CM | POA: Diagnosis not present

## 2020-09-19 DIAGNOSIS — Z23 Encounter for immunization: Secondary | ICD-10-CM | POA: Diagnosis not present

## 2020-09-19 DIAGNOSIS — J449 Chronic obstructive pulmonary disease, unspecified: Secondary | ICD-10-CM | POA: Diagnosis not present

## 2020-09-19 MED ORDER — INFLUENZA VAC A&B SA ADJ QUAD 0.5 ML IM PRSY
0.5000 mL | PREFILLED_SYRINGE | Freq: Once | INTRAMUSCULAR | Status: DC
  Filled 2020-09-19: qty 0.5

## 2020-09-19 NOTE — Progress Notes (Signed)
Keith Roberson, [place] [address]   CLINIC:  Medical Oncology/Hematology  PCP:  Abran Richard, MD [address] [place] / [place] [address]  [phone]  REASON FOR VISIT:  Follow-up for right neck mass  PRIOR THERAPY: None  CURRENT THERAPY: Under work-up  INTERVAL HISTORY:  Keith Roberson, a 69 y.o. male, returns for routine follow-up for his right neck mass. Keith Roberson was last seen by Robynn Pane on 04/21/2020.  Today Keith Roberson is accompanied by his sister and Keith Roberson reports feeling okay. Keith Roberson went to [facility] in July 2019 to see Dr. Ihor Austin for his right neck mass. Keith Roberson reports that Keith Roberson gets occasional aching and pain in his right neck. Keith Roberson has to eat soft foods due to not having teeth. Keith Roberson complains of having pain in his right medial foot. Keith Roberson denies having dizziness or gait problems, though Keith Roberson has pain coming from his right foot when ambulating. Keith Roberson denies having any MI's or CP. Keith Roberson denies losing weight.  Keith Roberson lives with his son in [place]. Keith Roberson is not able to do any work around the house. Keith Roberson used to work in [employer]. Keith Roberson continues smoking 1/2 PPD. Keith Roberson continues drinking Clear wine daily, but Keith Roberson has cut back. His mother had breast cancer; his father had prostate cancer; maternal aunt had ovarian cancer.   REVIEW OF SYSTEMS:  Review of Systems  Constitutional: Positive for appetite change (60%) and fatigue (75%).  HENT:   Positive for trouble swallowing (w/ solids).   Gastrointestinal: Positive for constipation.  Musculoskeletal: Positive for arthralgias (8/10 R foot pain). Negative for gait problem.  Neurological: Negative for dizziness and gait problem.  All other systems reviewed and are negative.   PAST MEDICAL/SURGICAL HISTORY:  Past Medical History:  Diagnosis Date  . Cancer ([place])    lung  . COPD (chronic obstructive pulmonary disease) ([place])   . Dementia ([place])   . Dyspnea   . Neck mass 04/21/2020  . Seizures ([place])    No past surgical  history on file.  SOCIAL HISTORY:  Social History   Socioeconomic History  . Marital status: Divorced    Spouse name: Not on file  . Number of children: 1  . Years of education: Not on file  . Highest education level: Not on file  Occupational History  . Occupation: DISABLED  Tobacco Use  . Smoking status: Current Every Day Smoker    Packs/day: 0.50    Types: Cigarettes  . Smokeless tobacco: Never Used  Vaping Use  . Vaping Use: Never used  Substance and Sexual Activity  . Alcohol use: Yes    Comment: occ  . Drug use: No  . Sexual activity: Not Currently  Other Topics Concern  . Not on file  Social History Narrative  . Not on file   Social Determinants of Health   Financial Resource Strain: Low Risk   . Difficulty of Paying Living Expenses: Not hard at all  Food Insecurity: No Food Insecurity  . Worried About Charity fundraiser in the Last Year: Never true  . Ran Out of Food in the Last Year: Never true  Transportation Needs: Unmet Transportation Needs  . Lack of Transportation (Medical): Yes  . Lack of Transportation (Non-Medical): Yes  Physical Activity: Inactive  . Days of Exercise per Week: 0 days  . Minutes of Exercise per Session: 0 min  Stress: No Stress Concern Present  . Feeling of Stress : Not at  all  Social Connections: Socially Isolated  . Frequency of Communication with Friends and Family: Once a week  . Frequency of Social Gatherings with Friends and Family: Once a week  . Attends Religious Services: More than 4 times per year  . Active Member of Clubs or Organizations: No  . Attends Archivist Meetings: Never  . Marital Status: Divorced  Human resources officer Violence:   . Fear of Current or Ex-Partner: Not on file  . Emotionally Abused: Not on file  . Physically Abused: Not on file  . Sexually Abused: Not on file    FAMILY HISTORY:  Family History  Problem Relation Age of Onset  . Breast cancer Mother   . Prostate cancer Father      CURRENT MEDICATIONS:  Current Outpatient Medications  Medication Sig Dispense Refill  . acetaminophen (TYLENOL) 500 MG tablet Take 1 tablet (500 mg total) by mouth every 8 (eight) hours. 60 tablet   . albuterol (VENTOLIN HFA) 108 (90 Base) MCG/ACT inhaler Inhale 2 puffs into the lungs every 4 (four) hours as needed for wheezing or shortness of breath. 8 g 3   Current Facility-Administered Medications  Medication Dose Route Frequency Provider Last Rate Last Admin  . influenza vaccine adjuvanted (FLUAD) injection 0.5 mL  0.5 mL Intramuscular Once Derek Jack, MD        ALLERGIES:  No Known Allergies  PHYSICAL EXAM:  Performance status (ECOG): 2 - Symptomatic, <50% confined to bed  Vitals:   09/19/20 1139  BP: (!) 141/77  Pulse: 93  Resp: 18  Temp: (!) 97.3 F (36.3 C)  SpO2: 98%   Wt Readings from Last 3 Encounters:  09/19/20 147 lb 9.6 oz (67 kg)  09/10/20 140 lb (63.5 kg)  07/23/20 162 lb (73.5 kg)   Physical Exam Vitals reviewed.  Constitutional:      Appearance: Normal appearance.  HENT:     Mouth/Throat:     Lips: No lesions.     Mouth: No oral lesions.     Dentition: No gum lesions.     Tongue: No lesions.     Palate: No mass and lesions.     Tonsils: Tonsillar abscess (anterior pillar of R tonsil) present.  Cardiovascular:     Rate and Rhythm: Normal rate and regular rhythm.     Pulses: Normal pulses.     Heart sounds: Normal heart sounds.  Pulmonary:     Effort: Pulmonary effort is normal.     Breath sounds: Normal breath sounds.  Abdominal:     Palpations: Abdomen is soft. There is no mass.     Tenderness: There is no abdominal tenderness.  Musculoskeletal:     Right lower leg: No edema.     Left lower leg: No edema.  Lymphadenopathy:     Cervical: Cervical adenopathy (R mass at C3 level 5 cm x 8 cm freely mobile) present.  Neurological:     General: No focal deficit present.     Mental Status: Keith Roberson is alert and oriented to person,  place, and time.  Psychiatric:        Mood and Affect: Mood normal.        Behavior: Behavior normal.      LABORATORY DATA:  I have reviewed the labs as listed.  CBC Latest Ref Rng & Units 09/11/2020 09/10/2020 07/23/2020  WBC 4.0 - 10.5 K/uL 6.6 7.7 7.8  Hemoglobin 13.0 - 17.0 g/dL 11.9(L) 13.4 13.0  Hematocrit 39 - 52 % 36.4(L) 41.8  38.6(L)  Platelets 150 - 400 K/uL 210 244 271   CMP Latest Ref Rng & Units 09/11/2020 09/10/2020 07/23/2020  Glucose 70 - 99 mg/dL 89 95 82  BUN 8 - 23 mg/dL 20 15 9   Creatinine 0.61 - 1.24 mg/dL 1.03 0.93 0.97  Sodium 135 - 145 mmol/L 137 138 141  Potassium 3.5 - 5.1 mmol/L 4.1 4.3 4.2  Chloride 98 - 111 mmol/L 103 102 105  CO2 22 - 32 mmol/L 24 25 26   Calcium 8.9 - 10.3 mg/dL 9.0 9.4 8.7(L)  Total Protein 6.5 - 8.1 g/dL 6.7 7.9 7.2  Total Bilirubin 0.3 - 1.2 mg/dL 0.5 0.6 0.6  Alkaline Phos 38 - 126 U/L 64 74 80  AST 15 - 41 U/L 14(L) 17 17  ALT 0 - 44 U/L 8 7 8       Component Value Date/Time   RBC 3.47 (L) 09/11/2020 0527   MCV 104.9 (H) 09/11/2020 0527   MCH 34.3 (H) 09/11/2020 0527   MCHC 32.7 09/11/2020 0527   RDW 14.8 09/11/2020 0527   LYMPHSABS 2.3 09/10/2020 1250   MONOABS 0.5 09/10/2020 1250   EOSABS 0.1 09/10/2020 1250   BASOSABS 0.0 09/10/2020 1250   Surgical pathology ([id number]) on 09/11/2020:   DIAGNOSTIC IMAGING:  I have independently reviewed the scans and discussed with the patient. [place] CORE BIOPSY (LYMPH NODES)  Result Date: 09/11/2020 INDICATION: Right neck nodal mass EXAM: ULTRASOUND GUIDED CORE BIOPSY OF RIGHT NECK NODAL MASS MEDICATIONS: 1% LIDOCAINE LOCAL ANESTHESIA/SEDATION: Versed 1.0mg  IV; Fentanyl 20mcg IV; Moderate Sedation Time:  10 MINUTES The patient was continuously monitored during the procedure by the interventional radiology nurse under my direct supervision. FLUOROSCOPY TIME:  Fluoroscopy Time: NONE. COMPLICATIONS: None immediate. PROCEDURE: The procedure, risks, benefits, and alternatives were  explained to the patient. Questions regarding the procedure were encouraged and answered. The patient understands and consents to the procedure. Previous imaging reviewed. Preliminary ultrasound performed. Right neck nodal mass was localized and marked. Under sterile conditions and local anesthesia, 18 gauge core biopsy was advanced to the right neck irregular nodal mass. 18 gauge core biopsies obtained. Samples placed in saline. Postprocedure imaging demonstrates no hemorrhage or hematoma. Patient tolerated biopsy well. FINDINGS: Imaging confirms needle placed in the right neck nodal mass for core biopsy IMPRESSION: Successful ultrasound right neck nodal mass 18 gauge core biopsies Electronically Signed   By: Jerilynn Mages.  Shick M.D.   On: 09/11/2020 16:01   CT OUTSIDE FILMS HEAD/FACE  Result Date: 09/10/2020 This examination belongs to an outside facility and is stored here for comparison purposes only.  Contact the originating outside institution for any associated report or interpretation.  CT OUTSIDE FILMS SOFT TISSUE NECK  Result Date: 09/10/2020 This examination belongs to an outside facility and is stored here for comparison purposes only.  Contact the originating outside institution for any associated report or interpretation.    ASSESSMENT:  1.  Right tonsil squamous cell carcinoma: -Patient reported right neck swelling for the last couple of years.  Keith Roberson is accompanied by his sister. -Weight loss of 10 pounds in the last 6 months. -Reportedly seen at [facility] in 2019.  I could not find any records on Care Everywhere. -Keith Roberson had biopsy of the right neck lymph node mass on 09/11/2020 consistent with squamous cell carcinoma. -Physical examination today shows right tonsillar mass involving the anterior tonsillar pillar.  Also some involvement of the soft palate.  2.  Social/family history: -Keith Roberson lives at home with his son. -Current active smoker.  Also history of alcohol abuse.  Drinks alcohol  daily. -Mother had breast cancer.  Father had prostate cancer and maternal aunt had ovarian cancer.    PLAN:  1.  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 -Recommend PET CT scan for staging.  Recommend ENT evaluation. -Would also recommend nutrition and swallow evaluation. -We will request p16 staining on the biopsy. -Follow-up after the PET CT scan.  If no evidence of metastatic disease, Keith Roberson will be referred to radiation therapy and possibly weekly cisplatin.  Orders placed this encounter:  Orders Placed This Encounter  Procedures  . NM PET Image Initial (PI) Skull Base To Thigh     Derek Jack, MD [place] [phone]   I, Milinda Antis, am acting as a scribe for Dr. Sanda Linger.  I, Derek Jack MD, have reviewed the above documentation for accuracy and completeness, and I agree with the above.

## 2020-09-19 NOTE — Patient Instructions (Signed)
[place] at [facility] Discharge Instructions  You were seen today by Dr. Delton Coombes. He went over your recent results; your biopsy results showed a squamous cell carcinoma of the neck. You will be referred to an ENT specialist for further imaging. You will be scheduled for a PET scan before your next visit. Dr. Delton Coombes will see you back in 5 weeks for labs and follow up.   Thank you for choosing [place] at [facility] to provide your oncology and hematology care.  To afford each patient quality time with our provider, please arrive at least 15 minutes before your scheduled appointment time.   If you have a lab appointment with the [facility] please come in thru the Main Entrance and check in at the main information desk  You need to re-schedule your appointment should you arrive 10 or more minutes late.  We strive to give you quality time with our providers, and arriving late affects you and other patients whose appointments are after yours.  Also, if you no show three or more times for appointments you may be dismissed from the clinic at the providers discretion.     Again, thank you for choosing [facility].  Our hope is that these requests will decrease the amount of time that you wait before being seen by our physicians.       _____________________________________________________________  Should you have questions after your visit to [facility], please contact our office at [phone] between the hours of 8:00 a.m. and 4:30 p.m.  Voicemails left after 4:00 p.m. will not be returned until the following business day.  For prescription refill requests, have your pharmacy contact our office and allow 72 hours.    Cancer Center Support Programs:   > Cancer Support Group  2nd Tuesday of the month 1pm-2pm, Journey Room

## 2020-09-19 NOTE — Progress Notes (Signed)
Request faxed to [facility] for medical records from when the patient seen Dr. Marchelle Gearing.

## 2020-09-20 ENCOUNTER — Encounter (HOSPITAL_COMMUNITY): Payer: Self-pay | Admitting: Lab

## 2020-09-20 ENCOUNTER — Ambulatory Visit (HOSPITAL_COMMUNITY): Payer: Medicare HMO | Admitting: Hematology

## 2020-09-20 LAB — SURGICAL PATHOLOGY

## 2020-09-20 NOTE — Progress Notes (Signed)
Referral sent to [facility].  Records faxed on 12/1

## 2020-09-20 NOTE — Progress Notes (Signed)
Per [facility], provider reviewed and refused to see patient.  Provider recommended the patient return to Dr Ihor Austin @  [facility].  Patient does not want to travel that far.  New referral sent to Dr. Benjamine Mola.

## 2020-09-21 ENCOUNTER — Other Ambulatory Visit (HOSPITAL_COMMUNITY): Payer: Self-pay | Admitting: *Deleted

## 2020-09-21 ENCOUNTER — Other Ambulatory Visit (HOSPITAL_COMMUNITY): Payer: Self-pay | Admitting: Hematology

## 2020-09-21 DIAGNOSIS — C4442 Squamous cell carcinoma of skin of scalp and neck: Secondary | ICD-10-CM

## 2020-09-21 MED ORDER — TRAMADOL HCL 50 MG PO TABS
50.0000 mg | ORAL_TABLET | Freq: Two times a day (BID) | ORAL | 0 refills | Status: AC | PRN
Start: 2020-09-21 — End: ?

## 2020-09-23 ENCOUNTER — Encounter (HOSPITAL_COMMUNITY): Payer: Self-pay | Admitting: Radiology

## 2020-09-23 ENCOUNTER — Emergency Department (HOSPITAL_COMMUNITY): Payer: Medicare HMO

## 2020-09-23 ENCOUNTER — Emergency Department (HOSPITAL_COMMUNITY)
Admission: EM | Admit: 2020-09-23 | Discharge: 2020-09-23 | Disposition: A | Discharge status: Home / Self Care | Payer: Medicare HMO | Attending: Emergency Medicine | Admitting: Emergency Medicine

## 2020-09-23 DIAGNOSIS — J441 Chronic obstructive pulmonary disease with (acute) exacerbation: Secondary | ICD-10-CM | POA: Diagnosis not present

## 2020-09-23 DIAGNOSIS — G893 Neoplasm related pain (acute) (chronic): Secondary | ICD-10-CM | POA: Diagnosis not present

## 2020-09-23 DIAGNOSIS — F0390 Unspecified dementia without behavioral disturbance: Secondary | ICD-10-CM | POA: Diagnosis not present

## 2020-09-23 DIAGNOSIS — F17210 Nicotine dependence, cigarettes, uncomplicated: Secondary | ICD-10-CM | POA: Diagnosis not present

## 2020-09-23 DIAGNOSIS — H9201 Otalgia, right ear: Secondary | ICD-10-CM | POA: Diagnosis present

## 2020-09-23 LAB — CBC WITH DIFFERENTIAL/PLATELET
Abs Immature Granulocytes: 0.03 10*3/uL (ref 0.00–0.07)
Basophils Absolute: 0 10*3/uL (ref 0.0–0.1)
Basophils Relative: 0 %
Eosinophils Absolute: 0.1 10*3/uL (ref 0.0–0.5)
Eosinophils Relative: 1 %
HCT: 39.7 % (ref 39.0–52.0)
Hemoglobin: 12.8 g/dL — ABNORMAL LOW (ref 13.0–17.0)
Immature Granulocytes: 0 %
Lymphocytes Relative: 29 %
Lymphs Abs: 2.3 10*3/uL (ref 0.7–4.0)
MCH: 34.1 pg — ABNORMAL HIGH (ref 26.0–34.0)
MCHC: 32.2 g/dL (ref 30.0–36.0)
MCV: 105.9 fL — ABNORMAL HIGH (ref 80.0–100.0)
Monocytes Absolute: 0.6 10*3/uL (ref 0.1–1.0)
Monocytes Relative: 7 %
Neutro Abs: 4.8 10*3/uL (ref 1.7–7.7)
Neutrophils Relative %: 63 %
Platelets: 220 10*3/uL (ref 150–400)
RBC: 3.75 MIL/uL — ABNORMAL LOW (ref 4.22–5.81)
RDW: 14.3 % (ref 11.5–15.5)
WBC: 7.8 10*3/uL (ref 4.0–10.5)
nRBC: 0 % (ref 0.0–0.2)

## 2020-09-23 LAB — BASIC METABOLIC PANEL
Anion gap: 8 (ref 5–15)
BUN: 14 mg/dL (ref 8–23)
CO2: 25 mmol/L (ref 22–32)
Calcium: 8.8 mg/dL — ABNORMAL LOW (ref 8.9–10.3)
Chloride: 106 mmol/L (ref 98–111)
Creatinine, Ser: 0.96 mg/dL (ref 0.61–1.24)
GFR, Estimated: >60 mL/min (ref >60)
Glucose, Bld: 82 mg/dL (ref 70–99)
Potassium: 3.9 mmol/L (ref 3.5–5.1)
Sodium: 139 mmol/L (ref 135–145)

## 2020-09-23 MED ORDER — OXYCODONE-ACETAMINOPHEN 5-325 MG PO TABS
1.0000 | ORAL_TABLET | Freq: Once | ORAL | Status: AC
  Administered 2020-09-23: 1 via ORAL
  Filled 2020-09-23: qty 1

## 2020-09-23 MED ORDER — NAPROXEN 250 MG PO TABS
500.0000 mg | ORAL_TABLET | Freq: Once | ORAL | Status: AC
  Administered 2020-09-23: 500 mg via ORAL
  Filled 2020-09-23: qty 2

## 2020-09-23 MED ORDER — HYDROCODONE-ACETAMINOPHEN 5-325 MG PO TABS
1.0000 | ORAL_TABLET | Freq: Two times a day (BID) | ORAL | 0 refills | Status: AC | PRN
Start: 2020-09-23 — End: 2020-09-27

## 2020-09-23 MED ORDER — IOHEXOL 300 MG/ML  SOLN
75.0000 mL | Freq: Once | INTRAMUSCULAR | Status: AC | PRN
  Administered 2020-09-23: 75 mL via INTRAVENOUS

## 2020-09-23 MED ORDER — NAPROXEN 375 MG PO TABS: 375.0000 mg | ORAL_TABLET | Freq: Two times a day (BID) | ORAL | 0 refills | Status: AC

## 2020-09-23 MED ORDER — BENZOCAINE 2.1 % EX LIQD: 4.0000 [drp] | Freq: Four times a day (QID) | CUTANEOUS | 0 refills | Status: DC | PRN

## 2020-09-23 NOTE — Discharge Instructions (Addendum)
The CT scan of Keith Roberson is unchanged. We suspect that the pain he is having is cancer related.  In addition to the Tylenol and tramadol that you have been giving him, please give him the Naprosyn as prescribed. Additionally you can put topical eardrops if needed for persistent pain. Norco should be given only if all pain medication has failed and he is in severe pain.  During his next appointment with the cancer doctor or ENT, discuss pain regimen if needed.

## 2020-09-23 NOTE — ED Triage Notes (Signed)
Pt. Is complaining of right ear pain for the last two days. Pt. Also has a lump present on the right side of the neck below the ear. Pt. States they have seen the doctor for the lump but they do not remember what the doctor told them about it.

## 2020-09-23 NOTE — ED Provider Notes (Addendum)
[facility] EMERGENCY DEPARTMENT Provider Note   CSN: [id number] Arrival date & time: 09/23/20  1442     History Chief Complaint  Patient presents with  . Otalgia    Keith Roberson is a 69 y.o. male.  HPI    69 year old male comes in a chief complaint of right-sided earache.  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  He reports that over the last 3 days the neck mass has gotten larger.  Patient is now having pain in his ear.  No ringing in the ear, no drainage from the ear.  No fevers, chills.   Past Medical History:  Diagnosis Date  . Cancer ([place])    lung  . COPD (chronic obstructive pulmonary disease) ([place])   . Dementia ([place])   . Dyspnea   . Neck mass 04/21/2020  . Seizures [facility])     Patient Active Problem List   Diagnosis Date Noted  . Mass of right side of neck 09/10/2020  . Neck mass 04/21/2020  . Respiratory failure with hypoxia ([place]) 10/04/2019  . COPD with acute exacerbation ([place]) 10/04/2019  . AKI (acute kidney injury) ([place]) 10/04/2019  . Alcohol abuse 10/06/2018  . Alcohol intoxication ([place]) 10/06/2018  . Dementia ([place]) 10/06/2018  . Seizures ([place]) 10/06/2018  . Special screening for malignant neoplasms, colon 10/23/2016    No past surgical history on file.     Family History  Problem Relation Age of Onset  . Breast cancer Mother   . Prostate cancer Father     Social History   Tobacco Use  . Smoking status: Current Every Day Smoker    Packs/day: 0.50    Types: Cigarettes  . Smokeless tobacco: Never Used  Vaping Use  . Vaping Use: Never used  Substance Use Topics  . Alcohol use: Yes    Comment: occ  . Drug use: No    Home Medications Prior to Admission medications   Medication Sig Start Date End Date Taking? Authorizing Provider  acetaminophen (TYLENOL) 500 MG tablet Take 1 tablet (500 mg total) by mouth every 8 (eight) hours. 09/11/20 10/11/20  Mercy Riding, MD  albuterol  (VENTOLIN HFA) 108 (90 Base) MCG/ACT inhaler Inhale 2 puffs into the lungs every 4 (four) hours as needed for wheezing or shortness of breath. 05/28/20   Wyvonnia Dusky, MD  BENZOCAINE, TOPICAL, 2.1 % LIQD Apply 4 drops topically 4 (four) times daily as needed. 09/23/20   Varney Biles, MD  HYDROcodone-acetaminophen (NORCO/VICODIN) 5-325 MG tablet Take 1 tablet by mouth every 12 (twelve) hours as needed for up to 4 days for severe pain. 09/23/20 09/27/20  Varney Biles, MD  naproxen (NAPROSYN) 375 MG tablet Take 1 tablet (375 mg total) by mouth 2 (two) times daily. 09/23/20   Varney Biles, MD  traMADol (ULTRAM) 50 MG tablet Take 1 tablet (50 mg total) by mouth every 12 (twelve) hours as needed. 09/21/20   Derek Jack, MD    Allergies    Patient has no known allergies.  Review of Systems   Review of Systems  Constitutional: Positive for activity change.  HENT: Negative for hearing loss.   Respiratory: Negative for shortness of breath.   Cardiovascular: Negative for chest pain.  Gastrointestinal: Negative for nausea and vomiting.  All other systems reviewed and are negative.   Physical Exam Updated Vital Signs BP 137/85   Pulse 81   Temp 97.7 F (36.5 C) (Oral)  Resp 13   Ht 5\' 11"  (1.803 m)   Wt 68 kg   SpO2 98%   BMI 20.92 kg/m   Physical Exam Vitals and nursing note reviewed.  Constitutional:      Appearance: He is well-developed.  HENT:     Head: Atraumatic.     Right Ear: Tympanic membrane and ear canal normal.     Left Ear: Tympanic membrane and ear canal normal.  Eyes:     Pupils: Pupils are equal, round, and reactive to light.  Neck:     Comments: Below the right ear patient has a large, hard, fixed mass in the subcutaneous region.  There is mild tenderness to palpation.  No fluctuance. Cardiovascular:     Rate and Rhythm: Normal rate.  Pulmonary:     Effort: Pulmonary effort is normal.  Musculoskeletal:     Cervical back: Neck supple.  Skin:     General: Skin is warm.  Neurological:     Mental Status: He is alert and oriented to person, place, and time.     ED Results / Procedures / Treatments   Labs (all labs ordered are listed, but only abnormal results are displayed) Labs Reviewed  CBC WITH DIFFERENTIAL/PLATELET - Abnormal; Notable for the following components:      Result Value   RBC 3.75 (*)    Hemoglobin 12.8 (*)    MCV 105.9 (*)    MCH 34.1 (*)    All other components within normal limits  BASIC METABOLIC PANEL - Abnormal; Notable for the following components:   Calcium 8.8 (*)    All other components within normal limits    EKG None  Radiology CT Soft Tissue Neck W Contrast  Result Date: 09/23/2020 CLINICAL DATA:  Head/neck cancer, surveillance. Additional provided: Right ear pain for the last 2 days, lump on right side of neck below ear. History of neck mass, lung cancer, dementia. EXAM: CT NECK WITH CONTRAST TECHNIQUE: Multidetector CT imaging of the neck was performed using the standard protocol following the bolus administration of intravenous contrast. CONTRAST:  76mL OMNIPAQUE IOHEXOL 300 MG/ML  SOLN COMPARISON:  Neck CT 09/06/2020. CT chest 05/10/2020 FINDINGS: Pharynx and larynx: Again demonstrated is a known right tonsillar mass. Not significantly changed from the prior CT of 09/06/2020, the mass measures 2.5 x 3.1 x 3.6 cm (AP x TV x CC) (for instance as seen on series 6, image 36) (series 5, image 37). The mass extends to but does not definitively cross midline. Salivary glands: No inflammation, mass, or stone. Thyroid: Unremarkable. Lymph nodes: Necrotic nodal mass at the right level 2 station measuring 4.2 x 3.7 x 5.9 cm, unchanged as compared to the prior CT. As before, there are ill-defined nodal margins suspicious for extracapsular extension. There is also suspected infiltration of the adjacent right sternocleidomastoid muscle. Unchanged adjacent right level 5 lymph nodes which are asymmetrically prominent  and suspicious, although subcentimeter in short axis (series 6, image 66). Unchanged right level 3/4 lymph node measuring only 7 mm in short axis, but with abnormal central low density, suspicious (series 6, image 79). Nonspecific mildly enlarged left level 2 lymph node measuring 12 mm in short axis, also unchanged (series 6, image 44). Vascular: Unchanged, a right level 2 necrotic nodal mass completely effaces, and may invade, the right internal jugular vein. Calcified plaque within the left greater than right carotid bifurcations. Limited intracranial: No acute intracranial abnormality identified. Visualized orbits: No mass or acute finding. Mastoids and visualized paranasal  sinuses: Trace scattered paranasal sinus mucosal thickening. No significant mastoid effusion. Skeleton: No acute bony abnormality or aggressive osseous lesion. Mild chronic C7 and T2 vertebral body compression fractures. Upper chest: Unchanged 7 mm nodule within the left upper lobe (series 2, image 114). No consolidation within the imaged lung apices. Centrilobular emphysema. IMPRESSION: 1. No significant interval change as compared to the CT neck of 09/06/2020. 2. Unchanged 2.5 x 3.1 x 3.6 cm right tonsillar mass. 3. Redemonstrated 4.2 x 5.9 cm necrotic nodal mass at the right level 2 station. This nodal mass completely effaces, and may invade, the right internal jugular vein. There is suspicion for extracapsular extension and this nodal mass may also involve the adjacent right sternocleidomastoid. 4. Suspicion for adjacent nodal metastatic disease at the right level 5 and 3/4 stations. 5. Nonspecific mildly enlarged left level 2 lymph node. 6. Stable 7 mm left upper lobe pulmonary nodule. 7.  Emphysema (ICD10-J43.9). Electronically Signed   By: Kellie Simmering DO   On: 09/23/2020 17:05    Procedures Procedures (including critical care time)  Medications Ordered in ED Medications  oxyCODONE-acetaminophen (PERCOCET/ROXICET) 5-325 MG per  tablet 1 tablet (has no administration in time range)  naproxen (NAPROSYN) tablet 500 mg (has no administration in time range)  iohexol (OMNIPAQUE) 300 MG/ML solution 75 mL (75 mLs Intravenous Contrast Given 09/23/20 1637)    ED Course  I have reviewed the triage vital signs and the nursing notes.  Pertinent labs & imaging results that were available during my care of the patient were reviewed by me and considered in my medical decision making (see chart for details).  Clinical Course as of Sep 23 1758  Sat Sep 23, 2020  1758 CT scan reveals mostly no new findings. I suspect that the pain is related to the cancer itself. I called Ms. Brooks, patient's sisters and went over the findings with her. It appears that patient has not quite able to articulate the extent of his condition, and the family is not surprised. She will come to pick up the patient. There is an appointment with ENT and oncology coming up this month. We will try to address the pain better. Currently is on tramadol 50 and Tylenol as needed.  CT Soft Tissue Neck W Contrast [AN]    Clinical Course User Index [AN] Varney Biles, MD   MDM Rules/Calculators/A&P                          69 year old male comes in a chief complaint of ear pain.  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  Patient is still awaiting PET scan for appropriate staging.  He comes in with complaints of ear pain that started within the last 24 hours.  He has noted swelling of the neck mass get worse over the last 3 days.  Patient does not have significant information on the neck mass and unaware of the actual diagnosis.   We will get CT scan soft tissue of the neck to further assess the neck mass. My suspicion is that the earache could be secondary to the lesion itself.  There is no functional loss and on exam there is no evidence of infection within the ear canal -doubt otitis media  Final  Clinical Impression(s) / ED Diagnoses Final diagnoses:  Cancer related pain    Rx / DC Orders ED Discharge Orders  Ordered    HYDROcodone-acetaminophen (NORCO/VICODIN) 5-325 MG tablet  Every 12 hours PRN        09/23/20 1755    BENZOCAINE, TOPICAL, 2.1 % LIQD  4 times daily PRN        09/23/20 1755    naproxen (NAPROSYN) 375 MG tablet  2 times daily        09/23/20 1755           Varney Biles, MD 09/23/20 1648    Varney Biles, MD 09/23/20 1759

## 2020-09-23 NOTE — ED Notes (Signed)
Spoke with Pt's sister who is coming to pick him up and discussed discharge instructions

## 2020-10-11 ENCOUNTER — Telehealth (HOSPITAL_COMMUNITY): Payer: Self-pay

## 2020-10-11 NOTE — Telephone Encounter (Signed)
Received a call from patient's sister stating that his voice was very hoarse.  Patient denies pain but his throat feels raw and irritated.    This nurse advised family member per V. Tanner, PA,  get cough drops or throat lozenges and to make sure he keeps his next scheduled appointment for 10/27/2019 to see MD, if she notices any changes in his breathing or swelling of the neck or throat bring patient to the ER immediately.  Family member also stated that she has been giving him hot tea with honey and lemon.  This nurse advised that hot tea would be fine as well.  No further questions or concerns at this time.

## 2020-10-19 ENCOUNTER — Other Ambulatory Visit (HOSPITAL_COMMUNITY): Payer: Self-pay

## 2020-10-19 ENCOUNTER — Telehealth (HOSPITAL_COMMUNITY): Payer: Self-pay | Admitting: *Deleted

## 2020-10-19 ENCOUNTER — Encounter (HOSPITAL_COMMUNITY): Payer: Self-pay

## 2020-10-19 MED ORDER — DIAZEPAM 5 MG PO TABS: 5.0000 mg | ORAL_TABLET | Freq: Once | ORAL | 0 refills | Status: AC

## 2020-10-19 NOTE — Progress Notes (Signed)
Call received from patient's sister asking to see if there is anything that the patient can take anything to calm his nerves prior to PET scan. Dr. Delton Coombes aware and has prescribed a one time dose of Valium 5mg . [name], sister, aware and will be driving the patient to that appt.

## 2020-10-23 ENCOUNTER — Other Ambulatory Visit (HOSPITAL_COMMUNITY): Payer: Medicare HMO

## 2020-10-23 ENCOUNTER — Ambulatory Visit (HOSPITAL_COMMUNITY): Payer: Medicare HMO | Admitting: Hematology

## 2020-10-23 ENCOUNTER — Inpatient Hospital Stay (HOSPITAL_COMMUNITY): Payer: Medicare HMO

## 2020-10-23 ENCOUNTER — Encounter (HOSPITAL_COMMUNITY): Payer: Medicare HMO

## 2020-10-26 ENCOUNTER — Ambulatory Visit (HOSPITAL_COMMUNITY): Payer: Medicare HMO | Admitting: Hematology

## 2020-10-26 ENCOUNTER — Other Ambulatory Visit (HOSPITAL_COMMUNITY): Payer: Medicare HMO

## 2020-10-31 ENCOUNTER — Inpatient Hospital Stay (HOSPITAL_COMMUNITY): Payer: Medicare HMO | Attending: Hematology

## 2020-10-31 ENCOUNTER — Other Ambulatory Visit: Payer: Self-pay

## 2020-10-31 DIAGNOSIS — Z85118 Personal history of other malignant neoplasm of bronchus and lung: Secondary | ICD-10-CM | POA: Insufficient documentation

## 2020-10-31 DIAGNOSIS — C4442 Squamous cell carcinoma of skin of scalp and neck: Secondary | ICD-10-CM

## 2020-10-31 DIAGNOSIS — J449 Chronic obstructive pulmonary disease, unspecified: Secondary | ICD-10-CM | POA: Insufficient documentation

## 2020-10-31 DIAGNOSIS — F17210 Nicotine dependence, cigarettes, uncomplicated: Secondary | ICD-10-CM | POA: Insufficient documentation

## 2020-10-31 DIAGNOSIS — Z79899 Other long term (current) drug therapy: Secondary | ICD-10-CM | POA: Insufficient documentation

## 2020-10-31 DIAGNOSIS — C099 Malignant neoplasm of tonsil, unspecified: Secondary | ICD-10-CM | POA: Diagnosis not present

## 2020-10-31 DIAGNOSIS — C770 Secondary and unspecified malignant neoplasm of lymph nodes of head, face and neck: Secondary | ICD-10-CM | POA: Diagnosis not present

## 2020-10-31 LAB — CBC WITH DIFFERENTIAL/PLATELET
Abs Immature Granulocytes: 0.03 10*3/uL (ref 0.00–0.07)
Basophils Absolute: 0 10*3/uL (ref 0.0–0.1)
Basophils Relative: 0 %
Eosinophils Absolute: 0.1 10*3/uL (ref 0.0–0.5)
Eosinophils Relative: 1 %
HCT: 47.8 % (ref 39.0–52.0)
Hemoglobin: 15.4 g/dL (ref 13.0–17.0)
Immature Granulocytes: 0 %
Lymphocytes Relative: 28 %
Lymphs Abs: 2.3 10*3/uL (ref 0.7–4.0)
MCH: 34.7 pg — ABNORMAL HIGH (ref 26.0–34.0)
MCHC: 32.2 g/dL (ref 30.0–36.0)
MCV: 107.7 fL — ABNORMAL HIGH (ref 80.0–100.0)
Monocytes Absolute: 0.5 10*3/uL (ref 0.1–1.0)
Monocytes Relative: 5 %
Neutro Abs: 5.5 10*3/uL (ref 1.7–7.7)
Neutrophils Relative %: 66 %
Platelets: 187 10*3/uL (ref 150–400)
RBC: 4.44 MIL/uL (ref 4.22–5.81)
RDW: 15.1 % (ref 11.5–15.5)
WBC: 8.5 10*3/uL (ref 4.0–10.5)
nRBC: 0 % (ref 0.0–0.2)

## 2020-10-31 LAB — COMPREHENSIVE METABOLIC PANEL
ALT: 9 U/L (ref 0–44)
AST: 20 U/L (ref 15–41)
Albumin: 4.4 g/dL (ref 3.5–5.0)
Alkaline Phosphatase: 118 U/L (ref 38–126)
Anion gap: 12 (ref 5–15)
BUN: 26 mg/dL — ABNORMAL HIGH (ref 8–23)
CO2: 26 mmol/L (ref 22–32)
Calcium: 9.2 mg/dL (ref 8.9–10.3)
Chloride: 101 mmol/L (ref 98–111)
Creatinine, Ser: 1.42 mg/dL — ABNORMAL HIGH (ref 0.61–1.24)
GFR, Estimated: 53 mL/min — ABNORMAL LOW (ref >60)
Glucose, Bld: 105 mg/dL — ABNORMAL HIGH (ref 70–99)
Potassium: 3.5 mmol/L (ref 3.5–5.1)
Sodium: 139 mmol/L (ref 135–145)
Total Bilirubin: 0.8 mg/dL (ref 0.3–1.2)
Total Protein: 7.9 g/dL (ref 6.5–8.1)

## 2020-11-06 ENCOUNTER — Encounter (HOSPITAL_COMMUNITY): Payer: Medicare HMO

## 2020-11-08 ENCOUNTER — Ambulatory Visit (HOSPITAL_COMMUNITY): Payer: Medicare HMO | Admitting: Hematology

## 2020-11-20 ENCOUNTER — Other Ambulatory Visit: Payer: Self-pay

## 2020-11-20 ENCOUNTER — Encounter (HOSPITAL_COMMUNITY)
Admission: RE | Admit: 2020-11-20 | Discharge: 2020-11-20 | Disposition: A | Discharge status: Home / Self Care | Payer: Medicare HMO | Source: Ambulatory Visit | Attending: Hematology | Admitting: Hematology

## 2020-11-20 DIAGNOSIS — R221 Localized swelling, mass and lump, neck: Secondary | ICD-10-CM | POA: Diagnosis not present

## 2020-11-20 MED ORDER — FLUDEOXYGLUCOSE F - 18 (FDG) INJECTION
8.6400 | Freq: Once | INTRAVENOUS | Status: AC | PRN
  Administered 2020-11-20: 8.64 via INTRAVENOUS

## 2020-11-22 ENCOUNTER — Other Ambulatory Visit: Payer: Self-pay

## 2020-11-22 ENCOUNTER — Inpatient Hospital Stay (HOSPITAL_COMMUNITY): Payer: Medicare HMO | Attending: Hematology | Admitting: Hematology

## 2020-11-22 VITALS — BP 121/81 | HR 98 | Temp 98.0°F | Resp 18 | Wt 141.0 lb

## 2020-11-22 DIAGNOSIS — Z79899 Other long term (current) drug therapy: Secondary | ICD-10-CM | POA: Diagnosis not present

## 2020-11-22 DIAGNOSIS — Z85118 Personal history of other malignant neoplasm of bronchus and lung: Secondary | ICD-10-CM | POA: Diagnosis not present

## 2020-11-22 DIAGNOSIS — F17210 Nicotine dependence, cigarettes, uncomplicated: Secondary | ICD-10-CM | POA: Diagnosis not present

## 2020-11-22 DIAGNOSIS — R634 Abnormal weight loss: Secondary | ICD-10-CM | POA: Diagnosis not present

## 2020-11-22 DIAGNOSIS — F0390 Unspecified dementia without behavioral disturbance: Secondary | ICD-10-CM | POA: Insufficient documentation

## 2020-11-22 DIAGNOSIS — J449 Chronic obstructive pulmonary disease, unspecified: Secondary | ICD-10-CM | POA: Insufficient documentation

## 2020-11-22 DIAGNOSIS — C770 Secondary and unspecified malignant neoplasm of lymph nodes of head, face and neck: Secondary | ICD-10-CM | POA: Diagnosis not present

## 2020-11-22 DIAGNOSIS — Z8042 Family history of malignant neoplasm of prostate: Secondary | ICD-10-CM | POA: Diagnosis not present

## 2020-11-22 DIAGNOSIS — Z803 Family history of malignant neoplasm of breast: Secondary | ICD-10-CM | POA: Insufficient documentation

## 2020-11-22 DIAGNOSIS — C099 Malignant neoplasm of tonsil, unspecified: Secondary | ICD-10-CM

## 2020-11-22 NOTE — Progress Notes (Signed)
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    CANCER STAGING: Cancer Staging No matching staging information was found for the patient.  INTERVAL HISTORY:  Mr. Keith Roberson, a 70 y.o. male, returns for routine follow-up of his squamous cell carcinoma of right tonsil. Flavius was last seen on 09/19/2020.   Today he is accompanied by his sister and brother and he reports feeling okay. He denies having any right ear pain, though he reports numbness in his right ear. His appetite is okay, though he lost 6 lbs in the last 2 months.  He does not drive. He is open to getting treatment. According to his children, he will not be able to give himself tube feeds. He drinks about a pint of wine in the evening.   REVIEW OF SYSTEMS:  Review of Systems  Constitutional: Positive for appetite change (50%), fatigue (50%) and unexpected weight change (lost 6 lbs in 2 months).  Neurological: Positive for numbness (R ear).  Hematological: Positive for adenopathy (swollen LN on R side of neck).  All other systems reviewed and are negative.   PAST MEDICAL/SURGICAL HISTORY:  Past Medical History:  Diagnosis Date  . Cancer ([facility])    lung  . COPD (chronic obstructive pulmonary disease) ([facility])   . Dementia ([facility])   . Dyspnea   . Neck mass 04/21/2020  . Seizures ([facility])    No past surgical history on file.  SOCIAL HISTORY:  Social History   Socioeconomic History  . Marital status: Divorced    Spouse name: Not on file  . Number of children: 1  . Years of education: Not on file  . Highest education level: Not on file  Occupational  History  . Occupation: DISABLED  Tobacco Use  . Smoking status: Current Every Day Smoker    Packs/day: 0.50    Types: Cigarettes  . Smokeless tobacco: Never Used  Vaping Use  . Vaping Use: Never used  Substance and Sexual Activity  . Alcohol use: Yes    Comment: occ  . Drug use: No  . Sexual activity: Not Currently  Other Topics Concern  . Not on file  Social History Narrative  . Not on file   Social Determinants of Health   Financial Resource Strain: Low Risk   . Difficulty of Paying Living Expenses: Not hard at all  Food Insecurity: No Food Insecurity  . Worried About Charity fundraiser in the Last Year: Never true  . Ran Out of Food in the Last Year: Never true  Transportation Needs: Unmet Transportation Needs  . Lack of Transportation (Medical): Yes  . Lack of Transportation (Non-Medical): Yes  Physical Activity: Inactive  . Days of Exercise per Week: 0 days  . Minutes of Exercise per Session: 0 min  Stress: No Stress Concern Present  . Feeling of Stress : Not at all  Social Connections: Socially Isolated  . Frequency of Communication with Friends and Family: Once a week  . Frequency of Social Gatherings with Friends and Family: Once a week  . Attends Religious Services: More than 4 times per year  .  Active Member of Clubs or Organizations: No  . Attends Archivist Meetings: Never  . Marital Status: Divorced  Human resources officer Violence: Not on file    FAMILY HISTORY:  Family History  Problem Relation Age of Onset  . Breast cancer Mother   . Prostate cancer Father     CURRENT MEDICATIONS:  Current Outpatient Medications  Medication Sig Dispense Refill  . amoxicillin-clavulanate (AUGMENTIN) 875-125 MG tablet 1 tablet    . ergocalciferol (VITAMIN D2) 1.25 MG (50000 UT) capsule Take by mouth.    Marland Kitchen LORazepam (ATIVAN) 0.5 MG tablet 1 tablet as needed every for hours    . morphine (ROXANOL) 20 MG/ML concentrated solution 1 ml as needed    . naproxen  (NAPROSYN) 375 MG tablet Take 1 tablet (375 mg total) by mouth 2 (two) times daily. 20 tablet 0  . ondansetron (ZOFRAN) 8 MG tablet 1 tablet    . phenytoin (DILANTIN) 200 MG ER capsule 1 capsule    . thiamine 100 MG tablet Take by mouth.    . traMADol (ULTRAM) 50 MG tablet Take 1 tablet (50 mg total) by mouth every 12 (twelve) hours as needed. 30 tablet 0  . albuterol (VENTOLIN HFA) 108 (90 Base) MCG/ACT inhaler Inhale 2 puffs into the lungs every 4 (four) hours as needed for wheezing or shortness of breath. (Patient not taking: Reported on 11/22/2020) 8 g 3  . BENZOCAINE, TOPICAL, 2.1 % LIQD Apply 4 drops topically 4 (four) times daily as needed. 30 mL 0   No current facility-administered medications for this visit.    ALLERGIES:  No Known Allergies  PHYSICAL EXAM:  Performance status (ECOG): 2 - Symptomatic, <50% confined to bed  Vitals:   11/22/20 1042  BP: 121/81  Pulse: 98  Resp: 18  Temp: 98 F (36.7 C)  SpO2: 100%   Wt Readings from Last 3 Encounters:  11/22/20 141 lb (64 kg)  09/23/20 150 lb (68 kg)  09/19/20 147 lb 9.6 oz (67 kg)   Physical Exam Vitals reviewed.  Constitutional:      Appearance: Normal appearance.  HENT:     Mouth/Throat:     Lips: No lesions.     Mouth: No oral lesions.     Tongue: No lesions.     Palate: No mass.     Tonsils: Tonsillar abscess (R tonsilar mass) present.  Neck:   Cardiovascular:     Rate and Rhythm: Normal rate and regular rhythm.     Pulses: Normal pulses.     Heart sounds: Normal heart sounds.  Pulmonary:     Effort: Pulmonary effort is normal.     Breath sounds: Normal breath sounds.  Lymphadenopathy:     Cervical: Cervical adenopathy present.     Right cervical: Superficial cervical adenopathy (upper neck mass less mobile and slightly bigger) present.     Left cervical: Superficial cervical adenopathy present.  Neurological:     General: No focal deficit present.     Mental Status: He is alert and oriented to  person, place, and time.  Psychiatric:        Mood and Affect: Mood normal.        Behavior: Behavior normal.      LABORATORY DATA:  I have reviewed the labs as listed.  CBC Latest Ref Rng & Units 10/31/2020 09/23/2020 09/11/2020  WBC 4.0 - 10.5 K/uL 8.5 7.8 6.6  Hemoglobin 13.0 - 17.0 g/dL 15.4 12.8(L) 11.9(L)  Hematocrit 39.0 - 52.0 % 47.8  39.7 36.4(L)  Platelets 150 - 400 K/uL 187 220 210   CMP Latest Ref Rng & Units 10/31/2020 09/23/2020 09/11/2020  Glucose 70 - 99 mg/dL 105(H) 82 89  BUN 8 - 23 mg/dL 26(H) 14 20  Creatinine 0.61 - 1.24 mg/dL 1.42(H) 0.96 1.03  Sodium 135 - 145 mmol/L 139 139 137  Potassium 3.5 - 5.1 mmol/L 3.5 3.9 4.1  Chloride 98 - 111 mmol/L 101 106 103  CO2 22 - 32 mmol/L 26 25 24   Calcium 8.9 - 10.3 mg/dL 9.2 8.8(L) 9.0  Total Protein 6.5 - 8.1 g/dL 7.9 - 6.7  Total Bilirubin 0.3 - 1.2 mg/dL 0.8 - 0.5  Alkaline Phos 38 - 126 U/L 118 - 64  AST 15 - 41 U/L 20 - 14(L)  ALT 0 - 44 U/L 9 - 8    DIAGNOSTIC IMAGING:  I have independently reviewed the scans and discussed with the patient. NM PET Image Initial (PI) Skull Base To Thigh  Result Date: 11/21/2020 CLINICAL DATA:  Initial treatment strategy for head and neck cancer (right tonsillar mass with hypermetabolic right neck adenopathy). EXAM: NUCLEAR MEDICINE PET SKULL BASE TO THIGH TECHNIQUE: 8.6 mCi F-18 FDG was injected intravenously. Full-ring PET imaging was performed from the skull base to thigh after the radiotracer. CT data was obtained and used for attenuation correction and anatomic localization. Fasting blood glucose: 86 mg/dl COMPARISON:  CT neck 09/23/2020 FINDINGS: Mediastinal blood pool activity: SUV max 2.5 Liver activity: SUV max NA NECK: Approximately 2.4 cm right tonsillar mass has maximum SUV of 17.9, compatible with malignancy. Centrally necrotic hypermetabolic right level II lymph node about 3.2 cm in short axis on image 43 series 3 (formerly 3.0 cm on 09/06/2020), maximum SUV 15.1. A  hypermetabolic left level IIa lymph node measures 1.1 cm in short axis on image 42 of series 3 (formerly 1.0 cm on 09/23/2020) with maximum SUV of 7.0, compatible with malignant involvement. A small right level V lymph node measuring 0.5 cm in short axis on image 54 series 3 has a maximum SUV of 4.1, suspicious for malignant involvement. A right level IV lymph node measuring 0.5 cm in short axis on image 67 of series 3 has a maximum SUV of 4.4, suspicious for malignant involvement. Incidental CT findings: Left common carotid atherosclerotic calcification. CHEST: No significant abnormal hypermetabolic activity in this region. Incidental CT findings: Coronary, aortic arch, and branch vessel atherosclerotic vascular disease. Mild biapical pleuroparenchymal scarring. Centrilobular emphysema. 0.6 by 0.2 cm nodule in the apicoposterior segment left upper lobe on image 90 of series 3, no change from 07/27/2019, not hypermetabolic but below sensitive PET-CT size thresholds. Mild volume loss in the left lower lobe. ABDOMEN/PELVIS: No significant abnormal hypermetabolic activity in this region. Incidental CT findings: Aortoiliac atherosclerotic vascular disease. 1.3 cm densely calcified lesion in the pancreatic head without associated hypermetabolic activity, possibly postinflammatory. Mild nodularity of the left adrenal gland without overt mass. Prominent stool throughout the colon favors constipation. SKELETON: Incidental accentuated activity in the left antecubital region, probably injection related. Incidental CT findings: Deformity of the right proximal femur associated with a prior fracture, with ORIF noted. Degenerative disc disease and degenerative endplate findings at K0-X3. IMPRESSION: 1. Hypermetabolic right tonsillar mass with a dominant 3.2 cm in short axis right level II hypermetabolic lymph node, and also small hypermetabolic lymph nodes in the left level IIa distribution, and right level V and IV distribution.  No definite metastatic involvement outside of the neck. 2. A 0.6 by 0.2  cm left upper lobe nodule is stable over the last 15 months, and probably benign but may merit surveillance in this setting. 3. Accentuated activity along the left antecubital fossa focally, this is usually injection related and incidental. 4. Focal dense calcification in the pancreatic head, without hypermetabolic activity, possibly postinflammatory. 5. Other imaging findings of potential clinical significance: Aortic Atherosclerosis (ICD10-I70.0). Coronary atherosclerosis. Emphysema (ICD10-J43.9). Prominent stool throughout the colon favors constipation. Electronically Signed   By: Van Clines M.D.   On: 11/21/2020 08:43     ASSESSMENT:  1.  Right tonsil squamous cell carcinoma: -Patient reported right neck swelling for the last couple of years.  He is accompanied by his sister. -Weight loss of 10 pounds in the last 6 months. -Reported[facility] seen at [facility] in [facility].  I could not find any records on Care Everywhere. -He had biopsy of the right neck lymph node mass on 09/11/2020 consistent with squamous cell carcinoma. -Physical examination today shows right tonsillar mass involving the anterior tonsillar pillar.  Also some involvement of the soft palate. -PET scan on 11/20/2020 shows 2.4 cm right tonsillar mass with SUV 17.9.  Centrally necrotic hypermetabolic right level 2 lymph node about 3.2 cm in short axis, SUV 15.1.  Hypermetabolic left level 2 lymph node measuring 1.1 cm.  Small right level 5 lymph node measuring 0.5 cm and right level 4 lymph node measuring 0.5 cm.  2.  Social/family history: -He lives at home with his son. -Current active smoker.  Also history of alcohol abuse.  Drinks alcohol daily. -Mother had breast cancer.  Father had prostate cancer and maternal aunt had ovarian cancer.   PLAN:  1.  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 -Reviewed results  of PET scan and images with the patient, his sister and brother. -Patient has mild dementia and has his sister as legal guardian.  He reportedly lives by himself.  He drinks a pint of wine every day. -We talked about options including active treatment with chemoradiation therapy versus best supportive care in the form of hospice. -Patient clearly wants to have treatment done.  I have discussed the side effects of chemotherapy in detail.  I also briefly discussed radiation therapy side effects and schedule. -I will make a referral to Dr. Adella Nissen. -He will also require port placement and feeding tube prior to start of therapy. -He will also need a nutrition and swallow consult. -Patient's brother and sister would like to have a family meeting on how to proceed. -We will see him back in a couple of weeks to discuss this again. -His latest creatinine is 1.42.  He will likely be a candidate for weekly cisplatin rather than high-dose cisplatin.   Orders placed this encounter:  No orders of the defined types were placed in this encounter.  Total time spent is 40 minutes with more than 50% of the time spent face-to-face discussing and reviewing the scan results, treatment plan, counseling and coordination of care.  Derek Jack, MD [place] [phone]   I, Milinda Antis, am acting as a scribe for Dr. Sanda Linger.  I, Derek Jack MD, have reviewed the above documentation for accuracy and completeness, and I agree with the above.

## 2020-11-22 NOTE — Patient Instructions (Signed)
[place] at [facility] Discharge Instructions  You were seen today by Dr. Delton Coombes. He went over your recent results. Your cancer, which is a squamous carcinoma of the right tonsil, has spread to the lymph nodes of the neck on the right and left side. Your cancer will require chemotherapy and radiation therapy. The chemotherapy is given every week at the cancer center, while the radiation will be in [place] given daily for 6 weeks. Given the toll that radiation takes on the neck, you will not be able to tolerate swallowing, so a feeding tube will have to be placed to facilitate tube feedings; you will be referred to the radiation oncologist in [place] for further details. You will need a port placed into your chest to receive chemotherapy; you will be referred to a general surgeon for port placement and PEG tube placement. Dr. Delton Coombes will see you back in 2 weeks for follow up.   Thank you for choosing [place] at [facility] to provide your oncology and hematology care.  To afford each patient quality time with our provider, please arrive at least 15 minutes before your scheduled appointment time.   If you have a lab appointment with the [facility] please come in thru the Main Entrance and check in at the main information desk  You need to re-schedule your appointment should you arrive 10 or more minutes late.  We strive to give you quality time with our providers, and arriving late affects you and other patients whose appointments are after yours.  Also, if you no show three or more times for appointments you may be dismissed from the clinic at the providers discretion.     Again, thank you for choosing [facility].  Our hope is that these requests will decrease the amount of time that you wait before being seen by our physicians.       _____________________________________________________________  Should you have questions after your  visit to [facility], please contact our office at [phone] between the hours of 8:00 a.m. and 4:30 p.m.  Voicemails left after 4:00 p.m. will not be returned until the following business day.  For prescription refill requests, have your pharmacy contact our office and allow 72 hours.    Cancer Center Support Programs:   > Cancer Support Group  2nd Tuesday of the month 1pm-2pm, Journey Room

## 2020-11-23 ENCOUNTER — Telehealth: Payer: Self-pay | Admitting: Internal Medicine

## 2020-11-23 NOTE — Telephone Encounter (Signed)
Pt has legal guardian.  Spoke with [employer]. Pt had the J&J vaccine, but not the booster. Offered appt for the booster, but she declined at this time.  Will call back when he is ready.

## 2020-11-24 ENCOUNTER — Encounter (HOSPITAL_COMMUNITY): Payer: Medicare HMO

## 2020-11-28 ENCOUNTER — Ambulatory Visit: Payer: Medicare HMO | Admitting: General Surgery

## 2020-12-01 ENCOUNTER — Encounter (HOSPITAL_COMMUNITY): Payer: Medicare HMO

## 2020-12-06 ENCOUNTER — Ambulatory Visit (HOSPITAL_COMMUNITY): Payer: Medicare HMO | Admitting: Hematology

## 2020-12-24 ENCOUNTER — Emergency Department (HOSPITAL_COMMUNITY): Payer: Medicare HMO

## 2020-12-24 ENCOUNTER — Emergency Department (HOSPITAL_COMMUNITY)
Admission: EM | Admit: 2020-12-24 | Discharge: 2020-12-25 | Disposition: A | Discharge status: Home / Self Care | Payer: Medicare HMO | Attending: Emergency Medicine | Admitting: Emergency Medicine

## 2020-12-24 ENCOUNTER — Encounter (HOSPITAL_COMMUNITY): Payer: Self-pay

## 2020-12-24 ENCOUNTER — Other Ambulatory Visit: Payer: Self-pay

## 2020-12-24 DIAGNOSIS — F17210 Nicotine dependence, cigarettes, uncomplicated: Secondary | ICD-10-CM | POA: Diagnosis not present

## 2020-12-24 DIAGNOSIS — N179 Acute kidney failure, unspecified: Secondary | ICD-10-CM | POA: Diagnosis not present

## 2020-12-24 DIAGNOSIS — R109 Unspecified abdominal pain: Secondary | ICD-10-CM | POA: Insufficient documentation

## 2020-12-24 DIAGNOSIS — Z85118 Personal history of other malignant neoplasm of bronchus and lung: Secondary | ICD-10-CM | POA: Diagnosis not present

## 2020-12-24 DIAGNOSIS — R1110 Vomiting, unspecified: Secondary | ICD-10-CM | POA: Diagnosis not present

## 2020-12-24 DIAGNOSIS — C099 Malignant neoplasm of tonsil, unspecified: Secondary | ICD-10-CM

## 2020-12-24 DIAGNOSIS — J441 Chronic obstructive pulmonary disease with (acute) exacerbation: Secondary | ICD-10-CM | POA: Insufficient documentation

## 2020-12-24 DIAGNOSIS — C091 Malignant neoplasm of tonsillar pillar (anterior) (posterior): Secondary | ICD-10-CM | POA: Insufficient documentation

## 2020-12-24 DIAGNOSIS — R221 Localized swelling, mass and lump, neck: Secondary | ICD-10-CM | POA: Diagnosis present

## 2020-12-24 DIAGNOSIS — F0390 Unspecified dementia without behavioral disturbance: Secondary | ICD-10-CM | POA: Insufficient documentation

## 2020-12-24 DIAGNOSIS — Z20822 Contact with and (suspected) exposure to covid-19: Secondary | ICD-10-CM | POA: Insufficient documentation

## 2020-12-24 DIAGNOSIS — E860 Dehydration: Secondary | ICD-10-CM | POA: Insufficient documentation

## 2020-12-24 LAB — CBC WITH DIFFERENTIAL/PLATELET
Abs Immature Granulocytes: 0.05 10*3/uL (ref 0.00–0.07)
Basophils Absolute: 0.1 10*3/uL (ref 0.0–0.1)
Basophils Relative: 1 %
Eosinophils Absolute: 0.1 10*3/uL (ref 0.0–0.5)
Eosinophils Relative: 1 %
HCT: 53.4 % — ABNORMAL HIGH (ref 39.0–52.0)
Hemoglobin: 17.6 g/dL — ABNORMAL HIGH (ref 13.0–17.0)
Immature Granulocytes: 1 %
Lymphocytes Relative: 31 %
Lymphs Abs: 2.9 10*3/uL (ref 0.7–4.0)
MCH: 36.2 pg — ABNORMAL HIGH (ref 26.0–34.0)
MCHC: 33 g/dL (ref 30.0–36.0)
MCV: 109.9 fL — ABNORMAL HIGH (ref 80.0–100.0)
Monocytes Absolute: 0.7 10*3/uL (ref 0.1–1.0)
Monocytes Relative: 7 %
Neutro Abs: 5.7 10*3/uL (ref 1.7–7.7)
Neutrophils Relative %: 59 %
Platelets: 118 10*3/uL — ABNORMAL LOW (ref 150–400)
RBC: 4.86 MIL/uL (ref 4.22–5.81)
RDW: 19.4 % — ABNORMAL HIGH (ref 11.5–15.5)
WBC: 9.5 10*3/uL (ref 4.0–10.5)
nRBC: 0.6 % — ABNORMAL HIGH (ref 0.0–0.2)

## 2020-12-24 LAB — URINALYSIS, ROUTINE W REFLEX MICROSCOPIC
Bacteria, UA: NONE SEEN
Bilirubin Urine: NEGATIVE
Glucose, UA: NEGATIVE mg/dL
Hgb urine dipstick: NEGATIVE
Ketones, ur: NEGATIVE mg/dL
Nitrite: NEGATIVE
Protein, ur: 30 mg/dL — AB
Specific Gravity, Urine: >1.046 — ABNORMAL HIGH (ref 1.005–1.030)
pH: 7 (ref 5.0–8.0)

## 2020-12-24 LAB — TROPONIN I (HIGH SENSITIVITY)
Troponin I (High Sensitivity): 10 ng/L (ref <18)
Troponin I (High Sensitivity): 10 ng/L (ref <18)

## 2020-12-24 LAB — COMPREHENSIVE METABOLIC PANEL
ALT: 7 U/L (ref 0–44)
AST: 18 U/L (ref 15–41)
Albumin: 3.7 g/dL (ref 3.5–5.0)
Alkaline Phosphatase: 122 U/L (ref 38–126)
Anion gap: 14 (ref 5–15)
BUN: 20 mg/dL (ref 8–23)
CO2: 28 mmol/L (ref 22–32)
Calcium: 8.7 mg/dL — ABNORMAL LOW (ref 8.9–10.3)
Chloride: 99 mmol/L (ref 98–111)
Creatinine, Ser: 1.38 mg/dL — ABNORMAL HIGH (ref 0.61–1.24)
GFR, Estimated: 55 mL/min — ABNORMAL LOW (ref >60)
Glucose, Bld: 110 mg/dL — ABNORMAL HIGH (ref 70–99)
Potassium: 3.6 mmol/L (ref 3.5–5.1)
Sodium: 141 mmol/L (ref 135–145)
Total Bilirubin: 1.7 mg/dL — ABNORMAL HIGH (ref 0.3–1.2)
Total Protein: 7 g/dL (ref 6.5–8.1)

## 2020-12-24 LAB — RESP PANEL BY RT-PCR (FLU A&B, COVID) ARPGX2
Influenza A by PCR: NEGATIVE
Influenza B by PCR: NEGATIVE
SARS Coronavirus 2 by RT PCR: NEGATIVE

## 2020-12-24 LAB — LACTIC ACID, PLASMA: Lactic Acid, Venous: 1.8 mmol/L (ref 0.5–1.9)

## 2020-12-24 LAB — LIPASE, BLOOD: Lipase: 21 U/L (ref 11–51)

## 2020-12-24 MED ORDER — SODIUM CHLORIDE 0.9 % IV BOLUS
500.0000 mL | Freq: Once | INTRAVENOUS | Status: AC
  Administered 2020-12-24: 500 mL via INTRAVENOUS

## 2020-12-24 MED ORDER — IOHEXOL 300 MG/ML  SOLN
100.0000 mL | Freq: Once | INTRAMUSCULAR | Status: AC | PRN
  Administered 2020-12-24: 100 mL via INTRAVENOUS

## 2020-12-24 MED ORDER — ONDANSETRON 4 MG PO TBDP
4.0000 mg | ORAL_TABLET | Freq: Three times a day (TID) | ORAL | 1 refills | Status: AC | PRN
Start: 2020-12-24 — End: ?

## 2020-12-24 MED ORDER — SODIUM CHLORIDE 0.9 % IV SOLN: INTRAVENOUS | Status: DC

## 2020-12-24 NOTE — ED Notes (Signed)
Patient transported to CT 

## 2020-12-24 NOTE — ED Triage Notes (Signed)
Pt presents to ED via [facility] for not eating, drinking and vomiting for several days. Family states had fever yesterday. Up to 100.0.

## 2020-12-24 NOTE — Discharge Instructions (Addendum)
Social worker will be contacting you to help arrange hospice care any home health needs that you have.  Return for any new or worse symptoms.  Did speak with Dr. Delton Coombes your hematology oncology physician and he stated that you all had decided not to do any further treatment.  Patient well-hydrated here.  At the Zofran as needed for nausea and vomiting.

## 2020-12-24 NOTE — ED Provider Notes (Signed)
[facility] EMERGENCY DEPARTMENT Provider Note   CSN: [id number] Arrival date & time: 12/24/20  1555     History Chief Complaint  Patient presents with  . Emesis    Keith Roberson is a 70 y.o. male.  Patient with obvious right-sided neck mass.  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  Patient last seen by hematology oncology February 2.  It appeared from that note that they were planning to do treatments.  Maybe patient got lost to follow-up.  Patient brought in by [facility] for not eating drinking for several days.  They stated vomiting.  No vomiting here.  Family states that patient had a fever yesterday up to 100.0.  Patient apparently lives with son.  His sister normally brought him to his hematology oncology appointments.  Patient states he has had some abdominal pain as well.  Chart review also shows the patient's had significant weight loss over the last several months.        Past Medical History:  Diagnosis Date  . Cancer ([place])    lung  . COPD (chronic obstructive pulmonary disease) ([place])   . Dementia ([place])   . Dyspnea   . Neck mass 04/21/2020  . Seizures [facility])     Patient Active Problem List   Diagnosis Date Noted  . Mass of right side of neck 09/10/2020  . Neck mass 04/21/2020  . Respiratory failure with hypoxia ([place]) 10/04/2019  . COPD with acute exacerbation ([place]) 10/04/2019  . AKI (acute kidney injury) ([place]) 10/04/2019  . Alcohol abuse 10/06/2018  . Alcohol intoxication ([place]) 10/06/2018  . Dementia ([place]) 10/06/2018  . Seizures ([place]) 10/06/2018  . Special screening for malignant neoplasms, colon 10/23/2016    History reviewed. No pertinent surgical history.     Family History  Problem Relation Age of Onset  . Breast cancer Mother   . Prostate cancer Father     Social History   Tobacco Use  . Smoking status: Current Every Day Smoker    Packs/day: 0.50    Types:  Cigarettes  . Smokeless tobacco: Never Used  Vaping Use  . Vaping Use: Never used  Substance Use Topics  . Alcohol use: Yes    Comment: occ  . Drug use: No    Home Medications Prior to Admission medications   Medication Sig Start Date End Date Taking? Authorizing Provider  albuterol (VENTOLIN HFA) 108 (90 Base) MCG/ACT inhaler Inhale 2 puffs into the lungs every 4 (four) hours as needed for wheezing or shortness of breath. Patient not taking: Reported on 11/22/2020 05/28/20   Wyvonnia Dusky, MD  amoxicillin-clavulanate (AUGMENTIN) 875-125 MG tablet 1 tablet 01/20/19   [provider]  BENZOCAINE, TOPICAL, 2.1 % LIQD Apply 4 drops topically 4 (four) times daily as needed. 09/23/20   Varney Biles, MD  ergocalciferol (VITAMIN D2) 1.25 MG (50000 UT) capsule Take by mouth.    [provider]  LORazepam (ATIVAN) 0.5 MG tablet 1 tablet as needed every for hours 05/11/20   [provider]  morphine (ROXANOL) 20 MG/ML concentrated solution 1 ml as needed 05/11/20   [provider]  naproxen (NAPROSYN) 375 MG tablet Take 1 tablet (375 mg total) by mouth 2 (two) times daily. 09/23/20   Varney Biles, MD  ondansetron (ZOFRAN) 8 MG tablet 1 tablet    [provider]  phenytoin (DILANTIN) 200 MG ER capsule 1 capsule  [provider]  thiamine 100 MG tablet Take by mouth.    [provider]  traMADol (ULTRAM) 50 MG tablet Take 1 tablet (50 mg total) by mouth every 12 (twelve) hours as needed. 09/21/20   Derek Jack, MD    Allergies    Patient has no known allergies.  Review of Systems   Review of Systems  Constitutional: Positive for fever. Negative for chills.  HENT: Negative for congestion, rhinorrhea and sore throat.   Eyes: Negative for visual disturbance.  Respiratory: Negative for cough and shortness of breath.   Cardiovascular: Negative for chest pain and leg swelling.  Gastrointestinal: Positive for abdominal pain,  nausea and vomiting. Negative for diarrhea.  Genitourinary: Negative for dysuria.  Musculoskeletal: Negative for back pain and neck pain.  Skin: Negative for rash.  Neurological: Negative for dizziness, light-headedness and headaches.  Hematological: Does not bruise/bleed easily.  Psychiatric/Behavioral: Negative for confusion.    Physical Exam Updated Vital Signs BP 129/85   Pulse 82   Temp 98 F (36.7 C) (Oral)   Resp 16   Ht 1.816 m (5' 11.5")   Wt 64 kg   SpO2 98%   BMI 19.40 kg/m   Physical Exam Vitals and nursing note reviewed.  Constitutional:      Appearance: He is well-developed and well-nourished. He is ill-appearing.     Comments: Patient is very thin  HENT:     Head: Normocephalic.     Mouth/Throat:     Mouth: Mucous membranes are dry.  Eyes:     Extraocular Movements: Extraocular movements intact.     Conjunctiva/sclera: Conjunctivae normal.     Pupils: Pupils are equal, round, and reactive to light.  Neck:     Comments: Large soft tissue mass right side of the neck.  Measuring about 5 x 7 cm.  Firm but nontender. Cardiovascular:     Rate and Rhythm: Normal rate and regular rhythm.     Heart sounds: No murmur heard.   Pulmonary:     Effort: Pulmonary effort is normal. No respiratory distress.     Breath sounds: Normal breath sounds.  Abdominal:     Palpations: Abdomen is soft.     Tenderness: There is no abdominal tenderness.  Musculoskeletal:        General: No swelling or edema.     Cervical back: Neck supple.  Lymphadenopathy:     Cervical: Cervical adenopathy present.  Skin:    General: Skin is warm and dry.     Capillary Refill: Capillary refill takes less than 2 seconds.  Neurological:     General: No focal deficit present.     Mental Status: He is alert and oriented to person, place, and time.     Cranial Nerves: No cranial nerve deficit.     Sensory: No sensory deficit.     Motor: No weakness.  Psychiatric:        Mood and Affect:  Mood and affect normal.     ED Results / Procedures / Treatments   Labs (all labs ordered are listed, but only abnormal results are displayed) Labs Reviewed  COMPREHENSIVE METABOLIC PANEL - Abnormal; Notable for the following components:      Result Value   Glucose, Bld 110 (*)    Creatinine, Ser 1.38 (*)    Calcium 8.7 (*)    Total Bilirubin 1.7 (*)    GFR, Estimated 55 (*)    All other components within normal limits  CBC WITH DIFFERENTIAL/PLATELET -  Abnormal; Notable for the following components:   Hemoglobin 17.6 (*)    HCT 53.4 (*)    MCV 109.9 (*)    MCH 36.2 (*)    RDW 19.4 (*)    Platelets 118 (*)    nRBC 0.6 (*)    All other components within normal limits  URINALYSIS, ROUTINE W REFLEX MICROSCOPIC - Abnormal; Notable for the following components:   Color, Urine AMBER (*)    Specific Gravity, Urine >1.046 (*)    Protein, ur 30 (*)    Leukocytes,Ua TRACE (*)    All other components within normal limits  CULTURE, BLOOD (ROUTINE X 2)  CULTURE, BLOOD (ROUTINE X 2)  RESP PANEL BY RT-PCR (FLU A&B, COVID) ARPGX2  LIPASE, BLOOD  LACTIC ACID, PLASMA  TROPONIN I (HIGH SENSITIVITY)  TROPONIN I (HIGH SENSITIVITY)    EKG EKG Interpretation  Date/Time:  Sunday December 24 2020 16:05:38 EST Ventricular Rate:  95 PR Interval:    QRS Duration: 78 QT Interval:  353 QTC Calculation: 444 R Axis:   76 Text Interpretation: Sinus rhythm Consider left atrial enlargement Posterior infarct, recent No significant change since last tracing Confirmed by Fredia Sorrow [phone]) on 12/24/2020 4:30:01 PM   Radiology CT Abdomen Pelvis W Contrast  Result Date: 12/24/2020 CLINICAL DATA:  Nausea and vomiting.  Abdominal pain EXAM: CT ABDOMEN AND PELVIS WITH CONTRAST TECHNIQUE: Multidetector CT imaging of the abdomen and pelvis was performed using the standard protocol following bolus administration of intravenous contrast. CONTRAST:  148mL OMNIPAQUE IOHEXOL 300 MG/ML  SOLN COMPARISON:  None.  FINDINGS: Lower chest: Lung bases are clear. Hepatobiliary: Benign cyst in the posterior RIGHT hepatic lobe. Gallbladder normal. Pancreas: Pancreas is normal. No ductal dilatation. No pancreatic inflammation. Spleen: Normal spleen Adrenals/urinary tract: Adrenal glands and kidneys are normal. The ureters and bladder normal. Stomach/Bowel: Mild hernia. Stomach duodenum small bowel normal. Cecum normal. Colon scattered diverticula. No evidence of acute inflammation. Rectum normal. Vascular/Lymphatic: Abdominal aorta is normal caliber with atherosclerotic calcification. There is no retroperitoneal or periportal lymphadenopathy. No pelvic lymphadenopathy. Reproductive: Prostate unremarkable Other: No free fluid. Musculoskeletal: No aggressive osseous lesion. Internal fixation of RIGHT hip IMPRESSION: 1. No explanation for abdominal pain. 2. Diverticulosis without evidence diverticulitis. 3.  Aortic Atherosclerosis (ICD10-I70.0). Electronically Signed   By: Suzy Bouchard M.D.   On: 12/24/2020 18:18   DG Chest Port 1 View  Result Date: 12/24/2020 CLINICAL DATA:  Vomiting and not eating for several days. EXAM: PORTABLE CHEST 1 VIEW COMPARISON:  July 23, 2020 FINDINGS: The lungs are hyperinflated. Diffuse, chronic appearing increased lung markings are seen. There is no evidence of acute infiltrate, pleural effusion or pneumothorax. The heart size and mediastinal contours are within normal limits. The visualized skeletal structures are unremarkable. IMPRESSION: Chronic appearing increased lung markings without evidence of acute or active cardiopulmonary disease. Electronically Signed   By: Virgina Norfolk M.D.   On: 12/24/2020 16:27    Procedures Procedures   CRITICAL CARE Performed by: Fredia Sorrow Total critical care time: 35 minutes Critical care time was exclusive of separately billable procedures and treating other patients. Critical care was necessary to treat or prevent imminent or  life-threatening deterioration. Critical care was time spent personally by me on the following activities: development of treatment plan with patient and/or surrogate as well as nursing, discussions with consultants, evaluation of patient's response to treatment, examination of patient, obtaining history from patient or surrogate, ordering and performing treatments and interventions, ordering and review of laboratory studies, ordering  and review of radiographic studies, pulse oximetry and re-evaluation of patient's condition.   Medications Ordered in ED Medications  0.9 %  sodium chloride infusion ( Intravenous Rate/Dose Verify 12/24/20 1921)  sodium chloride 0.9 % bolus 500 mL (0 mLs Intravenous Stopped 12/24/20 1728)  iohexol (OMNIPAQUE) 300 MG/ML solution 100 mL (100 mLs Intravenous Contrast Given 12/24/20 1737)  sodium chloride 0.9 % bolus 500 mL (500 mLs Intravenous New Bag/Given 12/24/20 2049)    ED Course  I have reviewed the triage vital signs and the nursing notes.  Pertinent labs & imaging results that were available during my care of the patient were reviewed by me and considered in my medical decision making (see chart for details).    MDM Rules/Calculators/A&P                          Patient very cachectic.  Patient appears clinically very dehydrated.  Abdomen is flat no tenderness to palpation but patient had complained of some abdominal pain.  Patient has not done any vomiting here.  Patient from chart review appears that he was to receive treatment for the squamous cell carcinoma in May of got lost to follow-up.  Patient kidney function is been bad in January and February.  It was normal in December.  Creatinine was 1.4 in January may have been very dehydrated then today it is in the 1.38 range.  Work-up here without any findings to explain the fever urine although extremely concentrated is not consistent with infection.  Troponins negative Covid testing negative chest x-ray does not  show pneumonia.  An abdominal CT had no acute findings.  Discussed with hospitalist for consideration for admission for hydration.  I been giving patient IV fluids here he is feeling a little bit better with it.  They felt he did not meet criteria for admission and that does not meet criteria for acute kidney injury because it was abnormal and January.  Discussed with Dr. Delton Coombes.  Apparently patient is no longer to receive treatment apparently family decided against that he was post to be moving towards hospice.  But is not sure if the connections have been made with that.  Based on that I will hydrate patient here give him additional fluid challenges and go ahead and have the social worker follow-up with him for home needs and to get him towards hospice.  Patient's family may not be able to come and get him here tonight if so we will continue to hydrate him overnight.  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

## 2020-12-24 NOTE — ED Notes (Signed)
Called pts family members as listed in demographics to notify them of discharge.

## 2020-12-25 DIAGNOSIS — C091 Malignant neoplasm of tonsillar pillar (anterior) (posterior): Secondary | ICD-10-CM | POA: Diagnosis not present

## 2020-12-25 NOTE — ED Notes (Signed)
Pt to be discharged to home, will perform vital signs routine.

## 2020-12-25 NOTE — ED Notes (Signed)
Call placed to patients sister as listed in the demographics, call successful. Advised Ms. Keith Roberson that pt has been discharged from ED.

## 2020-12-25 NOTE — ED Notes (Signed)
Called pts family as listed in demographics to return a call to [facility] ED.

## 2020-12-29 LAB — CULTURE, BLOOD (ROUTINE X 2)
Culture: NO GROWTH
Culture: NO GROWTH
Special Requests: ADEQUATE
Special Requests: ADEQUATE

## 2021-02-10 ENCOUNTER — Emergency Department (HOSPITAL_COMMUNITY)
Admission: EM | Admit: 2021-02-10 | Discharge: 2021-02-11 | Disposition: A | Discharge status: Home / Self Care | Attending: Emergency Medicine | Admitting: Emergency Medicine

## 2021-02-10 ENCOUNTER — Encounter (HOSPITAL_COMMUNITY): Payer: Self-pay | Admitting: Emergency Medicine

## 2021-02-10 ENCOUNTER — Emergency Department (HOSPITAL_COMMUNITY)

## 2021-02-10 ENCOUNTER — Other Ambulatory Visit: Payer: Self-pay

## 2021-02-10 DIAGNOSIS — Z85118 Personal history of other malignant neoplasm of bronchus and lung: Secondary | ICD-10-CM | POA: Diagnosis not present

## 2021-02-10 DIAGNOSIS — F0390 Unspecified dementia without behavioral disturbance: Secondary | ICD-10-CM | POA: Diagnosis not present

## 2021-02-10 DIAGNOSIS — I700 Atherosclerosis of aorta: Secondary | ICD-10-CM | POA: Diagnosis not present

## 2021-02-10 DIAGNOSIS — E860 Dehydration: Secondary | ICD-10-CM | POA: Diagnosis not present

## 2021-02-10 DIAGNOSIS — Z20822 Contact with and (suspected) exposure to covid-19: Secondary | ICD-10-CM | POA: Diagnosis not present

## 2021-02-10 DIAGNOSIS — K5730 Diverticulosis of large intestine without perforation or abscess without bleeding: Secondary | ICD-10-CM | POA: Diagnosis not present

## 2021-02-10 DIAGNOSIS — R197 Diarrhea, unspecified: Secondary | ICD-10-CM | POA: Insufficient documentation

## 2021-02-10 DIAGNOSIS — K861 Other chronic pancreatitis: Secondary | ICD-10-CM | POA: Insufficient documentation

## 2021-02-10 DIAGNOSIS — F17210 Nicotine dependence, cigarettes, uncomplicated: Secondary | ICD-10-CM | POA: Insufficient documentation

## 2021-02-10 DIAGNOSIS — R0602 Shortness of breath: Secondary | ICD-10-CM | POA: Insufficient documentation

## 2021-02-10 DIAGNOSIS — C099 Malignant neoplasm of tonsil, unspecified: Secondary | ICD-10-CM | POA: Diagnosis not present

## 2021-02-10 DIAGNOSIS — R000 Tachycardia, unspecified: Secondary | ICD-10-CM | POA: Diagnosis not present

## 2021-02-10 DIAGNOSIS — J441 Chronic obstructive pulmonary disease with (acute) exacerbation: Secondary | ICD-10-CM | POA: Insufficient documentation

## 2021-02-10 LAB — CBC WITH DIFFERENTIAL/PLATELET
Abs Immature Granulocytes: 0.06 10*3/uL (ref 0.00–0.07)
Basophils Absolute: 0 10*3/uL (ref 0.0–0.1)
Basophils Relative: 0 %
Eosinophils Absolute: 0 10*3/uL (ref 0.0–0.5)
Eosinophils Relative: 0 %
HCT: 47.5 % (ref 39.0–52.0)
Hemoglobin: 16.2 g/dL (ref 13.0–17.0)
Immature Granulocytes: 1 %
Lymphocytes Relative: 14 %
Lymphs Abs: 1.8 10*3/uL (ref 0.7–4.0)
MCH: 35.4 pg — ABNORMAL HIGH (ref 26.0–34.0)
MCHC: 34.1 g/dL (ref 30.0–36.0)
MCV: 103.9 fL — ABNORMAL HIGH (ref 80.0–100.0)
Monocytes Absolute: 0.6 10*3/uL (ref 0.1–1.0)
Monocytes Relative: 4 %
Neutro Abs: 10.4 10*3/uL — ABNORMAL HIGH (ref 1.7–7.7)
Neutrophils Relative %: 81 %
Platelets: 268 10*3/uL (ref 150–400)
RBC: 4.57 MIL/uL (ref 4.22–5.81)
RDW: 17 % — ABNORMAL HIGH (ref 11.5–15.5)
WBC: 12.9 10*3/uL — ABNORMAL HIGH (ref 4.0–10.5)
nRBC: 0.2 % (ref 0.0–0.2)

## 2021-02-10 LAB — COMPREHENSIVE METABOLIC PANEL
ALT: 13 U/L (ref 0–44)
AST: 28 U/L (ref 15–41)
Albumin: 3.9 g/dL (ref 3.5–5.0)
Alkaline Phosphatase: 122 U/L (ref 38–126)
Anion gap: 15 (ref 5–15)
BUN: 20 mg/dL (ref 8–23)
CO2: 21 mmol/L — ABNORMAL LOW (ref 22–32)
Calcium: 9.4 mg/dL (ref 8.9–10.3)
Chloride: 102 mmol/L (ref 98–111)
Creatinine, Ser: 1.15 mg/dL (ref 0.61–1.24)
GFR, Estimated: >60 mL/min (ref >60)
Glucose, Bld: 97 mg/dL (ref 70–99)
Potassium: 5.3 mmol/L — ABNORMAL HIGH (ref 3.5–5.1)
Sodium: 138 mmol/L (ref 135–145)
Total Bilirubin: 1.8 mg/dL — ABNORMAL HIGH (ref 0.3–1.2)
Total Protein: 7.9 g/dL (ref 6.5–8.1)

## 2021-02-10 LAB — RESP PANEL BY RT-PCR (FLU A&B, COVID) ARPGX2
Influenza A by PCR: NEGATIVE
Influenza B by PCR: NEGATIVE
SARS Coronavirus 2 by RT PCR: NEGATIVE

## 2021-02-10 MED ORDER — SODIUM CHLORIDE 0.9 % IV BOLUS
1000.0000 mL | Freq: Once | INTRAVENOUS | Status: AC
  Administered 2021-02-10: 1000 mL via INTRAVENOUS

## 2021-02-10 MED ORDER — IPRATROPIUM-ALBUTEROL 0.5-2.5 (3) MG/3ML IN SOLN
3.0000 mL | Freq: Once | RESPIRATORY_TRACT | Status: AC
  Administered 2021-02-10: 3 mL via RESPIRATORY_TRACT
  Filled 2021-02-10: qty 3

## 2021-02-10 MED ORDER — IOHEXOL 350 MG/ML SOLN
100.0000 mL | Freq: Once | INTRAVENOUS | Status: AC | PRN
  Administered 2021-02-10: 100 mL via INTRAVENOUS

## 2021-02-10 NOTE — ED Notes (Signed)
Pt ambulating to bathroom multiple times with no signs of physical weakness.

## 2021-02-10 NOTE — ED Notes (Signed)
Pts Family Evette Georges) updated on Pts status.

## 2021-02-10 NOTE — ED Provider Notes (Signed)
[facility] EMERGENCY DEPARTMENT Provider Note   CSN: [id number] Arrival date & time: 02/10/21  1838     History Chief Complaint  Patient presents with  . Weakness  . Shortness of Breath    Keith Roberson is a 70 y.o. male.  Pt presents to the ED today with weakness.  The pt has a hx of squamous cell carcinoma of the right tonsil.  Pt had opted against any treatment.  Pt's chart said he was on hospice.  However, pt denied this.  He lives by himself.  His sister comes over to help every once in a while.  The pt has no other help at home.  Pt feels weak.  He has not been eating today.          Past Medical History:  Diagnosis Date  . Cancer ([place])    lung  . COPD (chronic obstructive pulmonary disease) ([place])   . Dementia ([place])   . Dyspnea   . Neck mass 04/21/2020  . Seizures [facility])     Patient Active Problem List   Diagnosis Date Noted  . Mass of right side of neck 09/10/2020  . Neck mass 04/21/2020  . Respiratory failure with hypoxia ([place]) 10/04/2019  . COPD with acute exacerbation ([place]) 10/04/2019  . AKI (acute kidney injury) ([place]) 10/04/2019  . Alcohol abuse 10/06/2018  . Alcohol intoxication ([place]) 10/06/2018  . Dementia ([place]) 10/06/2018  . Seizures ([place]) 10/06/2018  . Special screening for malignant neoplasms, colon 10/23/2016    History reviewed. No pertinent surgical history.     Family History  Problem Relation Age of Onset  . Breast cancer Mother   . Prostate cancer Father     Social History   Tobacco Use  . Smoking status: Current Every Day Smoker    Packs/day: 0.50    Types: Cigarettes  . Smokeless tobacco: Never Used  Vaping Use  . Vaping Use: Never used  Substance Use Topics  . Alcohol use: Yes    Comment: occ  . Drug use: No    Home Medications Prior to Admission medications   Medication Sig Start Date End Date Taking? Authorizing Provider  albuterol (VENTOLIN HFA) 108 (90 Base) MCG/ACT inhaler Inhale 2 puffs into the lungs every 4  (four) hours as needed for wheezing or shortness of breath. Patient not taking: Reported on 11/22/2020 05/28/20   Wyvonnia Dusky, MD  LORazepam (ATIVAN) 0.5 MG tablet 1 tablet as needed every for hours 05/11/20   [provider]  naproxen (NAPROSYN) 375 MG tablet Take 1 tablet (375 mg total) by mouth 2 (two) times daily. 09/23/20   Varney Biles, MD  ondansetron (ZOFRAN ODT) 4 MG disintegrating tablet Take 1 tablet (4 mg total) by mouth every 8 (eight) hours as needed. 12/24/20   Fredia Sorrow, MD  traMADol (ULTRAM) 50 MG tablet Take 1 tablet (50 mg total) by mouth every 12 (twelve) hours as needed. 09/21/20   Derek Jack, MD    Allergies    Patient has no known allergies.  Review of Systems   Review of Systems  Respiratory: Positive for shortness of breath.   Neurological: Positive for weakness.  All other systems reviewed and are negative.   Physical Exam Updated Vital Signs BP (!) 142/68   Pulse (!) 107   Temp 99.7 F (37.6 C) (Oral)   Resp 18   Ht 5\' 11"  (1.803 m)   Wt 64 kg   SpO2 100%   BMI 19.67 kg/m  Physical Exam Vitals and nursing note reviewed.  Constitutional:      Appearance: He is cachectic.  HENT:     Mouth/Throat:     Mouth: Mucous membranes are dry.  Eyes:     Extraocular Movements: Extraocular movements intact.     Pupils: Pupils are equal, round, and reactive to light.  Neck:     Comments: Large mass to right neck Cardiovascular:     Rate and Rhythm: Regular rhythm. Tachycardia present.  Pulmonary:     Effort: Pulmonary effort is normal.     Breath sounds: Normal breath sounds.  Abdominal:     General: Bowel sounds are normal.     Palpations: Abdomen is soft.  Musculoskeletal:        General: Normal range of motion.  Skin:    General: Skin is warm.     Capillary Refill: Capillary refill takes less than 2 seconds.  Neurological:     General: No focal deficit present.     Mental Status: He is alert and oriented to person,  place, and time.  Psychiatric:        Mood and Affect: Mood normal.        Behavior: Behavior normal.     ED Results / Procedures / Treatments   Labs (all labs ordered are listed, but only abnormal results are displayed) Labs Reviewed  COMPREHENSIVE METABOLIC PANEL - Abnormal; Notable for the following components:      Result Value   Potassium 5.3 (*)    CO2 21 (*)    Total Bilirubin 1.8 (*)    All other components within normal limits  CBC WITH DIFFERENTIAL/PLATELET - Abnormal; Notable for the following components:   WBC 12.9 (*)    MCV 103.9 (*)    MCH 35.4 (*)    RDW 17.0 (*)    Neutro Abs 10.4 (*)    All other components within normal limits  RESP PANEL BY RT-PCR (FLU A&B, COVID) ARPGX2  GASTROINTESTINAL PANEL BY PCR, STOOL (REPLACES STOOL CULTURE)  C DIFFICILE QUICK SCREEN W PCR REFLEX  URINALYSIS, ROUTINE W REFLEX MICROSCOPIC    EKG None  Radiology DG Chest Port 1 View  Result Date: 02/10/2021 CLINICAL DATA:  Shortness of breath EXAM: PORTABLE CHEST 1 VIEW COMPARISON:  12/24/2020 FINDINGS: The heart size and mediastinal contours are within normal limits. Chronic mildly coarsened interstitial markings within the lung bases. Mildly hyperinflated lungs. No focal airspace consolidation, pleural effusion, or pneumothorax. The visualized skeletal structures are unremarkable. IMPRESSION: No active disease. Electronically Signed   By: Davina Poke D.O.   On: 02/10/2021 19:26    Procedures Procedures   Medications Ordered in ED Medications  sodium chloride 0.9 % bolus 1,000 mL (has no administration in time range)  iohexol (OMNIPAQUE) 350 MG/ML injection 100 mL (has no administration in time range)  sodium chloride 0.9 % bolus 1,000 mL (0 mLs Intravenous Stopped 02/10/21 2153)  ipratropium-albuterol (DUONEB) 0.5-2.5 (3) MG/3ML nebulizer solution 3 mL (3 mLs Nebulization Given 02/10/21 2106)    ED Course  I have reviewed the triage vital signs and the nursing  notes.  Pertinent labs & imaging results that were available during my care of the patient were reviewed by me and considered in my medical decision making (see chart for details).    MDM Rules/Calculators/A&P                          Pt said he does not  have home health, so I ordered home health.  Pt's HR initially went down after IVFs, but it went back up.  The nurse told me that he's been making frequent trips to the bathroom to have diarrhea.  Pt reports sob when he ambulated.  He is at high risk for PE due to his untreated cancer, so I will add a CTA.  Due to the diarrhea, I will add on stool studies and CT abd/pelvis.  Additional IVFs ordered.  Pt signed out to Dr. Wyvonnia Dusky at shift change.   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

## 2021-02-10 NOTE — ED Notes (Signed)
Patient transported to CT 

## 2021-02-10 NOTE — ED Triage Notes (Addendum)
Pt c/o of sob. Hx neck mass

## 2021-02-11 ENCOUNTER — Other Ambulatory Visit: Payer: Self-pay

## 2021-02-11 ENCOUNTER — Encounter (HOSPITAL_COMMUNITY): Payer: Self-pay | Admitting: Emergency Medicine

## 2021-02-11 ENCOUNTER — Emergency Department (HOSPITAL_COMMUNITY)

## 2021-02-11 ENCOUNTER — Emergency Department (HOSPITAL_COMMUNITY)
Admission: EM | Admit: 2021-02-11 | Discharge: 2021-02-12 | Disposition: A | Discharge status: Home / Self Care | Source: Home / Self Care | Attending: Emergency Medicine | Admitting: Emergency Medicine

## 2021-02-11 DIAGNOSIS — R000 Tachycardia, unspecified: Secondary | ICD-10-CM | POA: Insufficient documentation

## 2021-02-11 DIAGNOSIS — Z85118 Personal history of other malignant neoplasm of bronchus and lung: Secondary | ICD-10-CM | POA: Insufficient documentation

## 2021-02-11 DIAGNOSIS — J441 Chronic obstructive pulmonary disease with (acute) exacerbation: Secondary | ICD-10-CM | POA: Insufficient documentation

## 2021-02-11 DIAGNOSIS — F0390 Unspecified dementia without behavioral disturbance: Secondary | ICD-10-CM | POA: Insufficient documentation

## 2021-02-11 DIAGNOSIS — F17210 Nicotine dependence, cigarettes, uncomplicated: Secondary | ICD-10-CM | POA: Insufficient documentation

## 2021-02-11 DIAGNOSIS — R0602 Shortness of breath: Secondary | ICD-10-CM | POA: Insufficient documentation

## 2021-02-11 DIAGNOSIS — R0600 Dyspnea, unspecified: Secondary | ICD-10-CM

## 2021-02-11 DIAGNOSIS — C099 Malignant neoplasm of tonsil, unspecified: Secondary | ICD-10-CM | POA: Insufficient documentation

## 2021-02-11 DIAGNOSIS — Z85038 Personal history of other malignant neoplasm of large intestine: Secondary | ICD-10-CM | POA: Insufficient documentation

## 2021-02-11 LAB — CBC WITH DIFFERENTIAL/PLATELET
Abs Immature Granulocytes: 0.05 10*3/uL (ref 0.00–0.07)
Basophils Absolute: 0 10*3/uL (ref 0.0–0.1)
Basophils Relative: 0 %
Eosinophils Absolute: 0 10*3/uL (ref 0.0–0.5)
Eosinophils Relative: 0 %
HCT: 46.4 % (ref 39.0–52.0)
Hemoglobin: 15.4 g/dL (ref 13.0–17.0)
Immature Granulocytes: 1 %
Lymphocytes Relative: 27 %
Lymphs Abs: 2.7 10*3/uL (ref 0.7–4.0)
MCH: 35.3 pg — ABNORMAL HIGH (ref 26.0–34.0)
MCHC: 33.2 g/dL (ref 30.0–36.0)
MCV: 106.4 fL — ABNORMAL HIGH (ref 80.0–100.0)
Monocytes Absolute: 0.4 10*3/uL (ref 0.1–1.0)
Monocytes Relative: 4 %
Neutro Abs: 6.7 10*3/uL (ref 1.7–7.7)
Neutrophils Relative %: 68 %
Platelets: 245 10*3/uL (ref 150–400)
RBC: 4.36 MIL/uL (ref 4.22–5.81)
RDW: 17.2 % — ABNORMAL HIGH (ref 11.5–15.5)
WBC: 9.9 10*3/uL (ref 4.0–10.5)
nRBC: 0.2 % (ref 0.0–0.2)

## 2021-02-11 LAB — HEPATIC FUNCTION PANEL
ALT: 9 U/L (ref 0–44)
AST: 20 U/L (ref 15–41)
Albumin: 3.4 g/dL — ABNORMAL LOW (ref 3.5–5.0)
Alkaline Phosphatase: 87 U/L (ref 38–126)
Bilirubin, Direct: 0.3 mg/dL — ABNORMAL HIGH (ref 0.0–0.2)
Indirect Bilirubin: 1.1 mg/dL — ABNORMAL HIGH (ref 0.3–0.9)
Total Bilirubin: 1.4 mg/dL — ABNORMAL HIGH (ref 0.3–1.2)
Total Protein: 6.5 g/dL (ref 6.5–8.1)

## 2021-02-11 LAB — URINALYSIS, ROUTINE W REFLEX MICROSCOPIC
Bacteria, UA: NONE SEEN
Bilirubin Urine: NEGATIVE
Glucose, UA: NEGATIVE mg/dL
Ketones, ur: 5 mg/dL — AB
Leukocytes,Ua: NEGATIVE
Nitrite: NEGATIVE
Protein, ur: NEGATIVE mg/dL
Specific Gravity, Urine: >1.046 — ABNORMAL HIGH (ref 1.005–1.030)
pH: 6 (ref 5.0–8.0)

## 2021-02-11 LAB — BASIC METABOLIC PANEL
Anion gap: 12 (ref 5–15)
BUN: 15 mg/dL (ref 8–23)
CO2: 21 mmol/L — ABNORMAL LOW (ref 22–32)
Calcium: 8.8 mg/dL — ABNORMAL LOW (ref 8.9–10.3)
Chloride: 104 mmol/L (ref 98–111)
Creatinine, Ser: 0.96 mg/dL (ref 0.61–1.24)
GFR, Estimated: >60 mL/min (ref >60)
Glucose, Bld: 80 mg/dL (ref 70–99)
Potassium: 3.7 mmol/L (ref 3.5–5.1)
Sodium: 137 mmol/L (ref 135–145)

## 2021-02-11 LAB — BRAIN NATRIURETIC PEPTIDE: B Natriuretic Peptide: 92 pg/mL (ref 0.0–100.0)

## 2021-02-11 MED ORDER — SODIUM CHLORIDE 0.9 % IV BOLUS
1000.0000 mL | Freq: Once | INTRAVENOUS | Status: AC
  Administered 2021-02-11: 1000 mL via INTRAVENOUS

## 2021-02-11 MED ORDER — IOHEXOL 300 MG/ML  SOLN
75.0000 mL | Freq: Once | INTRAMUSCULAR | Status: AC | PRN
  Administered 2021-02-11: 75 mL via INTRAVENOUS

## 2021-02-11 NOTE — ED Provider Notes (Signed)
Care assumed from Dr. Gilford Raid.  Patient with squamous cell tonsillar cancer in hospice not in treatment.  He is here with feeling weak and generalized weakness with decreased PO intake.  Pending CT scan of the chest, abdomen and pelvis.  Does have some shortness of breath, tachycardia and frequent diarrhea.  Labs are reassuring.  CT scan shows no pulmonary embolism.  Does show chronic bronchiectatic changes in the left lower lobe and lingula which have been stable since 2021.  Similar appearing enlarged appendiceal tip without evidence of appendicitis but question for appendiceal carcinoid.  Findings of chronic pancreatitis.  D/w Dr. Arnoldo Morale of general surgery.  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  Dr. Arnoldo Morale states this does not need surgery and should be addressed with his oncologist.  This may be addressed after his tonsillar carcinoma is addressed.  Patient tolerating PO and able to ambulate without desaturation.  No further diarrhea. Unable to give a sample for stool studies.   D/w patient's sister and guardian Keith Roberson.  She states patient is in hospice for his tonsillar cancer and has elected against treatment.  He has not seen his oncologist Dr. Delton Coombes since February. There is been discussion amongst his family members that patient was not going to be treated for his cancer and patient had decided against this.  Results are discussed with Keith Roberson.  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 Results discussed with patient.  He does not appear to comprehend the severity of his situation. He is oriented to person and place.  He states he is not getting treatment for his tonsillar cancer and has chosen not to.  Heart rate is improved to the 90s.  Patient is tolerating p.o. and ambulatory.  No indication for admission. Follow-up with his oncologist.  Return precautions discussed   [name], MD 02/11/21 [phone]

## 2021-02-11 NOTE — ED Notes (Signed)
Pt oxygen saturation was above 95% while ambulating.

## 2021-02-11 NOTE — ED Notes (Signed)
Pt keeps pulling off leads, bp cuff and pulse ox continuously. Pt told the importance of keeping it on and encourage to not take it off.

## 2021-02-11 NOTE — ED Provider Notes (Signed)
[facility] EMERGENCY DEPARTMENT Provider Note   CSN: [id number] Arrival date & time: 02/11/21  2047     History Chief Complaint  Patient presents with  . Shortness of Breath    Keith Roberson is a 70 y.o. male.  Pt presents to the ED today with sob.  Pt has a hx of a large right tonsillar mass which the pt has not wanted treatment.  The pt was seen by me last night and discharged early this morning.  Work up yesterday did not reveal a pe or pna.  He had some diarrhea last night which has resolved now.  The pt said he feels like his neck mass is larger and he is still feeling sob.        Past Medical History:  Diagnosis Date  . 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 COPD (chronic obstructive pulmonary disease) ([place])   . Dementia ([place])   . Dyspnea   . Neck mass 04/21/2020  . Seizures [facility])     Patient Active Problem List   Diagnosis Date Noted  . Mass of right side of neck 09/10/2020  . Neck mass 04/21/2020  . Respiratory failure with hypoxia ([place]) 10/04/2019  . COPD with acute exacerbation ([place]) 10/04/2019  . AKI (acute kidney injury) ([place]) 10/04/2019  . Alcohol abuse 10/06/2018  . Alcohol intoxication ([place]) 10/06/2018  . Dementia ([place]) 10/06/2018  . Seizures ([place]) 10/06/2018  . Special screening for malignant neoplasms, colon 10/23/2016    History reviewed. No pertinent surgical history.     Family History  Problem Relation Age of Onset  . Breast cancer Mother   . Prostate cancer Father     Social History   Tobacco Use  . Smoking status: Current Every Day Smoker    Packs/day: 0.50    Types: Cigarettes  . Smokeless tobacco: Never Used  Vaping Use  . Vaping Use: Never used  Substance Use Topics  . Alcohol use: Yes    Comment: occ  . Drug use: No    Home Medications Prior to Admission medications   Medication Sig Start Date End Date Taking? Authorizing Provider  albuterol (VENTOLIN HFA) 108 (90 Base) MCG/ACT inhaler Inhale 2 puffs into the lungs every 4 (four)  hours as needed for wheezing or shortness of breath. Patient not taking: Reported on 11/22/2020 05/28/20   Wyvonnia Dusky, MD  LORazepam (ATIVAN) 0.5 MG tablet 1 tablet as needed every for hours 05/11/20   [provider]  naproxen (NAPROSYN) 375 MG tablet Take 1 tablet (375 mg total) by mouth 2 (two) times daily. 09/23/20   Varney Biles, MD  ondansetron (ZOFRAN ODT) 4 MG disintegrating tablet Take 1 tablet (4 mg total) by mouth every 8 (eight) hours as needed. 12/24/20   Fredia Sorrow, MD  traMADol (ULTRAM) 50 MG tablet Take 1 tablet (50 mg total) by mouth every 12 (twelve) hours as needed. 09/21/20   Derek Jack, MD    Allergies    Patient has no known allergies.  Review of Systems   Review of Systems  Respiratory: Positive for shortness of breath.   All other systems reviewed and are negative.   Physical Exam Updated Vital Signs BP (!) 160/103   Pulse 83   Temp 99.5 F (37.5 C) (Oral)   Resp 15   Ht 5\' 11"  (1.803 m)   Wt 64 kg   SpO2 99%   BMI 19.68 kg/m   Physical Exam Vitals and nursing note reviewed.  Constitutional:      Appearance: He is cachectic.  HENT:     Mouth/Throat:     Mouth: Mucous membranes are dry.  Eyes:     Extraocular Movements: Extraocular movements intact.     Pupils: Pupils are equal, round, and reactive to light.  Neck:     Comments: Large mass to right neck Cardiovascular:     Rate and Rhythm: Regular rhythm. Tachycardia present.  Pulmonary:     Effort: Tachypnea present.  Abdominal:     General: Bowel sounds are normal.     Palpations: Abdomen is soft.  Musculoskeletal:        General: Normal range of motion.     Cervical back: Normal range of motion and neck supple.  Skin:    General: Skin is warm.     Capillary Refill: Capillary refill takes less than 2 seconds.  Neurological:     General: No focal deficit present.     Mental Status: He is alert and oriented to person, place, and time.  Psychiatric:        Mood  and Affect: Mood normal.        Behavior: Behavior normal.     ED Results / Procedures / Treatments   Labs (all labs ordered are listed, but only abnormal results are displayed) Labs Reviewed  CBC WITH DIFFERENTIAL/PLATELET - Abnormal; Notable for the following components:      Result Value   MCV 106.4 (*)    MCH 35.3 (*)    RDW 17.2 (*)    All other components within normal limits  BASIC METABOLIC PANEL - Abnormal; Notable for the following components:   CO2 21 (*)    Calcium 8.8 (*)    All other components within normal limits  HEPATIC FUNCTION PANEL - Abnormal; Notable for the following components:   Albumin 3.4 (*)    Total Bilirubin 1.4 (*)    Bilirubin, Direct 0.3 (*)    Indirect Bilirubin 1.1 (*)    All other components within normal limits  BRAIN NATRIURETIC PEPTIDE    EKG EKG Interpretation  Date/Time:  Sunday February 11 2021 20:55:15 EDT Ventricular Rate:  97 PR Interval:  138 QRS Duration: 78 QT Interval:  356 QTC Calculation: 453 R Axis:   68 Text Interpretation: Sinus rhythm Since last tracing rate slower Confirmed by Isla Pence [id number]) on 02/11/2021 9:43:31 PM   Radiology CT Angio Chest PE W and/or Wo Contrast  Result Date: 02/11/2021 CLINICAL DATA:  Shortness of breath and diarrhea. Elevated D-dimer. Physical weakness. EXAM: CT ANGIOGRAPHY CHEST CT ABDOMEN AND PELVIS WITH CONTRAST TECHNIQUE: Multidetector CT imaging of the chest was performed using the standard protocol during bolus administration of intravenous contrast. Multiplanar CT image reconstructions and MIPs were obtained to evaluate the vascular anatomy. Multidetector CT imaging of the abdomen and pelvis was performed using the standard protocol during bolus administration of intravenous contrast. CONTRAST:  164mL OMNIPAQUE IOHEXOL 350 MG/ML SOLN COMPARISON:  CT abdomen pelvis 12/24/2020, CT abdomen pelvis 10/30/2008 FINDINGS: CTA CHEST FINDINGS Cardiovascular: Satisfactory opacification of the  pulmonary arteries to the segmental level. No evidence of pulmonary embolism. Normal heart size. No significant pericardial effusion. The thoracic aorta is normal in caliber. Mild atherosclerotic plaque of the thoracic aorta. At least left anterior descending coronary artery calcifications. Mediastinum/Nodes: Prominent but nonenlarged right hilar lymph node. No enlarged mediastinal, hilar, or axillary lymph nodes. Thyroid gland, trachea, and esophagus demonstrate no significant findings. Lungs/Pleura: Cystic bronchiectatic changes of the left lower  lobe and lingula. Bilateral lower lobe subsegmental atelectasis. No new pulmonary nodule. No pulmonary mass. No focal consolidation. No pleural effusion. No pneumothorax. Musculoskeletal: No chest wall abnormality. No suspicious lytic or blastic osseous lesions. No acute displaced fracture. Multilevel degenerative changes of the spine. Review of the MIP images confirms the above findings. CT ABDOMEN and PELVIS FINDINGS Hepatobiliary: A 1.3 cm fluid density lesion within the right hepatic lobe likely represents a simple hepatic cyst. Otherwise no focal hepatic lesion. No focal liver abnormality. No gallstones, gallbladder wall thickening, or pericholecystic fluid. No biliary dilatation. Pancreas: Coarse calcifications along the proximal pancreas. Diffusely atrophic. No focal lesion. Otherwise normal pancreatic contour. No surrounding inflammatory changes. No main pancreatic ductal dilatation. Spleen: Normal in size without focal abnormality. Adrenals/Urinary Tract: No adrenal nodule bilaterally. Bilateral kidneys enhance symmetrically. No hydronephrosis. No hydroureter. The urinary bladder is unremarkable. Stomach/Bowel: Stomach is within normal limits. No evidence of bowel wall thickening or dilatation. Diffuse colonic diverticulosis. Fatty infiltration of the ascending colon bowel wall suggestive of chronic inflammatory changes. The tip of the appendix is again noted to  be enlarged measuring up to 1.2 cm with associated calcifications. Vascular/Lymphatic: No abdominal aorta or iliac aneurysm. Severe atherosclerotic plaque of the aorta and its branches. No abdominal, pelvic, or inguinal lymphadenopathy. Reproductive: Prostate is unremarkable. Other: No intraperitoneal free fluid. No intraperitoneal free gas. No organized fluid collection. Musculoskeletal: No abdominal wall hernia or abnormality. No suspicious lytic or blastic osseous lesions. No acute displaced fracture. Multilevel degenerative changes of the spine. Status post chest surgical hardware fixation of a chronic healed right proximal femoral fracture. Review of the MIP images confirms the above findings. IMPRESSION: 1. No pulmonary embolus. 2. No acute intrathoracic abnormality in a patient with cystic bronchiectatic changes of the left lower lobe and lingula. 3. Similar-appearing enlarged appendiceal tip measuring up to 1.2 cm with associated calcifications. No findings of acute appendicitis; however, question appendiceal carcinoid. Recommend surgical consultation. 4. Other imaging findings of potential clinical significance: Findings of chronic pancreatitis. Diffuse colonic diverticulosis with no acute diverticulitis. Aortic Atherosclerosis (ICD10-I70.0). Electronically Signed   By: Iven Finn M.D.   On: 02/11/2021 00:48   CT ABDOMEN PELVIS W CONTRAST  Result Date: 02/11/2021 CLINICAL DATA:  Shortness of breath and diarrhea. Elevated D-dimer. Physical weakness. EXAM: CT ANGIOGRAPHY CHEST CT ABDOMEN AND PELVIS WITH CONTRAST TECHNIQUE: Multidetector CT imaging of the chest was performed using the standard protocol during bolus administration of intravenous contrast. Multiplanar CT image reconstructions and MIPs were obtained to evaluate the vascular anatomy. Multidetector CT imaging of the abdomen and pelvis was performed using the standard protocol during bolus administration of intravenous contrast. CONTRAST:   124mL OMNIPAQUE IOHEXOL 350 MG/ML SOLN COMPARISON:  CT abdomen pelvis 12/24/2020, CT abdomen pelvis 10/30/2008 FINDINGS: CTA CHEST FINDINGS Cardiovascular: Satisfactory opacification of the pulmonary arteries to the segmental level. No evidence of pulmonary embolism. Normal heart size. No significant pericardial effusion. The thoracic aorta is normal in caliber. Mild atherosclerotic plaque of the thoracic aorta. At least left anterior descending coronary artery calcifications. Mediastinum/Nodes: Prominent but nonenlarged right hilar lymph node. No enlarged mediastinal, hilar, or axillary lymph nodes. Thyroid gland, trachea, and esophagus demonstrate no significant findings. Lungs/Pleura: Cystic bronchiectatic changes of the left lower lobe and lingula. Bilateral lower lobe subsegmental atelectasis. No new pulmonary nodule. No pulmonary mass. No focal consolidation. No pleural effusion. No pneumothorax. Musculoskeletal: No chest wall abnormality. No suspicious lytic or blastic osseous lesions. No acute displaced fracture. Multilevel degenerative changes of the spine.  Review of the MIP images confirms the above findings. CT ABDOMEN and PELVIS FINDINGS Hepatobiliary: A 1.3 cm fluid density lesion within the right hepatic lobe likely represents a simple hepatic cyst. Otherwise no focal hepatic lesion. No focal liver abnormality. No gallstones, gallbladder wall thickening, or pericholecystic fluid. No biliary dilatation. Pancreas: Coarse calcifications along the proximal pancreas. Diffusely atrophic. No focal lesion. Otherwise normal pancreatic contour. No surrounding inflammatory changes. No main pancreatic ductal dilatation. Spleen: Normal in size without focal abnormality. Adrenals/Urinary Tract: No adrenal nodule bilaterally. Bilateral kidneys enhance symmetrically. No hydronephrosis. No hydroureter. The urinary bladder is unremarkable. Stomach/Bowel: Stomach is within normal limits. No evidence of bowel wall  thickening or dilatation. Diffuse colonic diverticulosis. Fatty infiltration of the ascending colon bowel wall suggestive of chronic inflammatory changes. The tip of the appendix is again noted to be enlarged measuring up to 1.2 cm with associated calcifications. Vascular/Lymphatic: No abdominal aorta or iliac aneurysm. Severe atherosclerotic plaque of the aorta and its branches. No abdominal, pelvic, or inguinal lymphadenopathy. Reproductive: Prostate is unremarkable. Other: No intraperitoneal free fluid. No intraperitoneal free gas. No organized fluid collection. Musculoskeletal: No abdominal wall hernia or abnormality. No suspicious lytic or blastic osseous lesions. No acute displaced fracture. Multilevel degenerative changes of the spine. Status post chest surgical hardware fixation of a chronic healed right proximal femoral fracture. Review of the MIP images confirms the above findings. IMPRESSION: 1. No pulmonary embolus. 2. No acute intrathoracic abnormality in a patient with cystic bronchiectatic changes of the left lower lobe and lingula. 3. Similar-appearing enlarged appendiceal tip measuring up to 1.2 cm with associated calcifications. No findings of acute appendicitis; however, question appendiceal carcinoid. Recommend surgical consultation. 4. Other imaging findings of potential clinical significance: Findings of chronic pancreatitis. Diffuse colonic diverticulosis with no acute diverticulitis. Aortic Atherosclerosis (ICD10-I70.0). Electronically Signed   By: Iven Finn M.D.   On: 02/11/2021 00:48   DG Chest Port 1 View  Result Date: 02/11/2021 CLINICAL DATA:  Shortness of breath EXAM: PORTABLE CHEST 1 VIEW COMPARISON:  February 10, 2021 FINDINGS: The heart size and mediastinal contours are within normal limits. Mild hyperinflation of the lungs with chronic coarsened interstitial markings. No focal consolidation. No pleural effusion. No pneumothorax. The visualized skeletal structures are  unremarkable. IMPRESSION: 1. No active cardiopulmonary disease. Electronically Signed   By: Dahlia Bailiff MD   On: 02/11/2021 21:33   DG Chest Port 1 View  Result Date: 02/10/2021 CLINICAL DATA:  Shortness of breath EXAM: PORTABLE CHEST 1 VIEW COMPARISON:  12/24/2020 FINDINGS: The heart size and mediastinal contours are within normal limits. Chronic mildly coarsened interstitial markings within the lung bases. Mildly hyperinflated lungs. No focal airspace consolidation, pleural effusion, or pneumothorax. The visualized skeletal structures are unremarkable. IMPRESSION: No active disease. Electronically Signed   By: Davina Poke D.O.   On: 02/10/2021 19:26    Procedures Procedures   Medications Ordered in ED Medications  iohexol (OMNIPAQUE) 300 MG/ML solution 75 mL (has no administration in time range)  sodium chloride 0.9 % bolus 1,000 mL (1,000 mLs Intravenous New Bag/Given 02/11/21 2115)    ED Course  I have reviewed the triage vital signs and the nursing notes.  Pertinent labs & imaging results that were available during my care of the patient were reviewed by me and considered in my medical decision making (see chart for details).    MDM Rules/Calculators/A&P  Pt's CT soft tissue neck pending at shift change.  Pt signed out to Dr. Karle Starch.  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

## 2021-02-11 NOTE — ED Triage Notes (Signed)
Pt brought in by EMS for c/o increased SOB. Was discharged from ED for same this am.

## 2021-02-11 NOTE — Discharge Instructions (Addendum)
Your testing shows dehydration.  Your CT scan showed a possible tumor in your appendix which may or may not be related to your tonsillar tumor.  You should follow-up with Dr. Delton Coombes of the cancer center for further evaluation if he elects to have this treated. Return to the ED with difficulty breathing, chest pain, not able to eat or drink or any other concerns.

## 2021-02-12 NOTE — ED Provider Notes (Signed)
Care of the patient assumed at the change of shift pending CT neck.  Physical Exam  BP (!) 158/97   Pulse 81   Temp 99.5 F (37.5 C) (Oral)   Resp 17   Ht 5\' 11"  (1.803 m)   Wt 64 kg   SpO2 100%   BMI 19.68 kg/m   Physical Exam Resting comfortably No resp distress  ED Course/Procedures     Procedures  MDM  CT shows no change in tonsillar mass. Necrotic lymph nodes are larger but no critical airway stenosis. Patient is currently resting comfortably. States he is feeling better and wants to go home. Dr. Gilford Raid has already arranged for home health to visit him. Advised to return to the ED if he gets to the point where he can no longer care for himself.        Truddie Hidden, MD 02/12/21 [phone]

## 2021-02-12 NOTE — ED Notes (Signed)
Pt's sister, [name], informed about pending discharge and need for transportation.  Family member endorses understanding.

## 2021-02-12 NOTE — ED Notes (Signed)
Pt waiting on family to pick him up

## 2021-02-12 NOTE — ED Notes (Addendum)
Pt family members called to let them know pt is being discharge and each refused to pick pt up. Informed them that pt will be in here in a room until someone is able to pick him up. Family members understand.

## 2021-02-12 NOTE — ED Notes (Signed)
The patients brother should be here around 0900 to pick him up.

## 2021-02-12 NOTE — ED Notes (Signed)
Pt is pending discharge. Will assess vitals per protocol, every 8 hours.

## 2021-02-12 NOTE — ED Notes (Signed)
Attempted to call numbers in the chart for pickup of pt but no answer.

## 2021-06-21 ENCOUNTER — Emergency Department (HOSPITAL_COMMUNITY)
Admission: EM | Admit: 2021-06-21 | Discharge: 2021-06-22 | Disposition: A | Discharge status: Home / Self Care | Attending: Emergency Medicine | Admitting: Emergency Medicine

## 2021-06-21 ENCOUNTER — Encounter (HOSPITAL_COMMUNITY): Payer: Self-pay

## 2021-06-21 ENCOUNTER — Emergency Department (HOSPITAL_COMMUNITY)

## 2021-06-21 ENCOUNTER — Other Ambulatory Visit: Payer: Self-pay

## 2021-06-21 DIAGNOSIS — F17210 Nicotine dependence, cigarettes, uncomplicated: Secondary | ICD-10-CM | POA: Insufficient documentation

## 2021-06-21 DIAGNOSIS — Z85118 Personal history of other malignant neoplasm of bronchus and lung: Secondary | ICD-10-CM | POA: Insufficient documentation

## 2021-06-21 DIAGNOSIS — J441 Chronic obstructive pulmonary disease with (acute) exacerbation: Secondary | ICD-10-CM | POA: Diagnosis not present

## 2021-06-21 DIAGNOSIS — F0390 Unspecified dementia without behavioral disturbance: Secondary | ICD-10-CM | POA: Diagnosis not present

## 2021-06-21 DIAGNOSIS — R0989 Other specified symptoms and signs involving the circulatory and respiratory systems: Secondary | ICD-10-CM | POA: Diagnosis not present

## 2021-06-21 DIAGNOSIS — Z85818 Personal history of malignant neoplasm of other sites of lip, oral cavity, and pharynx: Secondary | ICD-10-CM | POA: Diagnosis not present

## 2021-06-21 LAB — CBC WITH DIFFERENTIAL/PLATELET
Abs Immature Granulocytes: 0.04 10*3/uL (ref 0.00–0.07)
Basophils Absolute: 0 10*3/uL (ref 0.0–0.1)
Basophils Relative: 0 %
Eosinophils Absolute: 0.2 10*3/uL (ref 0.0–0.5)
Eosinophils Relative: 2 %
HCT: 41.3 % (ref 39.0–52.0)
Hemoglobin: 13 g/dL (ref 13.0–17.0)
Immature Granulocytes: 1 %
Lymphocytes Relative: 31 %
Lymphs Abs: 2.7 10*3/uL (ref 0.7–4.0)
MCH: 31.6 pg (ref 26.0–34.0)
MCHC: 31.5 g/dL (ref 30.0–36.0)
MCV: 100.5 fL — ABNORMAL HIGH (ref 80.0–100.0)
Monocytes Absolute: 0.6 10*3/uL (ref 0.1–1.0)
Monocytes Relative: 7 %
Neutro Abs: 5.2 10*3/uL (ref 1.7–7.7)
Neutrophils Relative %: 59 %
Platelets: 236 10*3/uL (ref 150–400)
RBC: 4.11 MIL/uL — ABNORMAL LOW (ref 4.22–5.81)
RDW: 12.9 % (ref 11.5–15.5)
WBC: 8.7 10*3/uL (ref 4.0–10.5)
nRBC: 0 % (ref 0.0–0.2)

## 2021-06-21 LAB — BASIC METABOLIC PANEL
Anion gap: 8 (ref 5–15)
BUN: 14 mg/dL (ref 8–23)
CO2: 27 mmol/L (ref 22–32)
Calcium: 9.4 mg/dL (ref 8.9–10.3)
Chloride: 102 mmol/L (ref 98–111)
Creatinine, Ser: 0.91 mg/dL (ref 0.61–1.24)
GFR, Estimated: >60 mL/min (ref >60)
Glucose, Bld: 88 mg/dL (ref 70–99)
Potassium: 4.1 mmol/L (ref 3.5–5.1)
Sodium: 137 mmol/L (ref 135–145)

## 2021-06-21 LAB — CBG MONITORING, ED
Glucose-Capillary: 277 mg/dL — ABNORMAL HIGH (ref 70–99)
Glucose-Capillary: 61 mg/dL — ABNORMAL LOW (ref 70–99)

## 2021-06-21 MED ORDER — DEXTROSE 50 % IV SOLN
1.0000 | Freq: Once | INTRAVENOUS | Status: AC
  Administered 2021-06-21: 50 mL via INTRAVENOUS
  Filled 2021-06-21: qty 50

## 2021-06-21 MED ORDER — IOHEXOL 350 MG/ML SOLN
75.0000 mL | Freq: Once | INTRAVENOUS | Status: AC | PRN
  Administered 2021-06-21: 75 mL via INTRAVENOUS

## 2021-06-21 NOTE — ED Provider Notes (Signed)
[facility] Provider Note   CSN: [id number] Arrival date & time: 06/21/21  0103     History No chief complaint on file.   Keith Roberson is a 70 y.o. male.  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  Patient apparently on hospice.  He was sent from his extended care facility for evaluation of foreign body sensation in his throat.  Patient is somewhat difficult to obtain history from.  He mentions to me swallowing a piece of plastic and does not feel as though it went into his stomach.  He denies any fevers or chills.  Denies any difficulty breathing.  The history is provided by the patient.      Past Medical History:  Diagnosis Date   Cancer ([place])    lung   COPD (chronic obstructive pulmonary disease) ([place])    Dementia ([place])    Dyspnea    Neck mass 04/21/2020   Seizures ([place])     Patient Active Problem List   Diagnosis Date Noted   Mass of right side of neck 09/10/2020   Neck mass 04/21/2020   Respiratory failure with hypoxia ([place]) 10/04/2019   COPD with acute exacerbation ([place]) 10/04/2019   AKI (acute kidney injury) ([place]) 10/04/2019   Alcohol abuse 10/06/2018   Alcohol intoxication ([place]) 10/06/2018   Dementia ([place]) 10/06/2018   Seizures ([place]) 10/06/2018   Special screening for malignant neoplasms, colon 10/23/2016    History reviewed. No pertinent surgical history.     Family History  Problem Relation Age of Onset   Breast cancer Mother    Prostate cancer Father     Social History   Tobacco Use   Smoking status: Every Day    Packs/day: 0.50    Types: Cigarettes   Smokeless tobacco: Never  Vaping Use   Vaping Use: Never used  Substance Use Topics   Alcohol use: Yes    Comment: occ   Drug use: No    Home Medications Prior to Admission medications   Medication Sig Start Date End Date Taking? Authorizing Provider  albuterol  (VENTOLIN HFA) 108 (90 Base) MCG/ACT inhaler Inhale 2 puffs into the lungs every 4 (four) hours as needed for wheezing or shortness of breath. Patient not taking: Reported on 11/22/2020 05/28/20   Wyvonnia Dusky, MD  LORazepam (ATIVAN) 0.5 MG tablet 1 tablet as needed every for hours 05/11/20   [provider]  naproxen (NAPROSYN) 375 MG tablet Take 1 tablet (375 mg total) by mouth 2 (two) times daily. 09/23/20   Varney Biles, MD  ondansetron (ZOFRAN ODT) 4 MG disintegrating tablet Take 1 tablet (4 mg total) by mouth every 8 (eight) hours as needed. 12/24/20   Fredia Sorrow, MD  traMADol (ULTRAM) 50 MG tablet Take 1 tablet (50 mg total) by mouth every 12 (twelve) hours as needed. 09/21/20   Derek Jack, MD    Allergies    Patient has no known allergies.  Review of Systems   Review of Systems  All other systems reviewed and are negative.  Physical Exam Updated Vital Signs BP (!) 172/95   Pulse 68   Temp 98.1 F (36.7 C)   Resp 20   Ht 5\' 11"  (1.803 m)   Wt 64 kg   SpO2 100%   BMI 19.68 kg/m   Physical Exam Vitals and nursing note reviewed.  Constitutional:  General: He is not in acute distress.    Appearance: He is well-developed. He is not diaphoretic.     Comments: Patient is awake and alert.  He does appear somewhat confused.  He is somewhat cachectic in appearance.  HENT:     Head: Normocephalic and atraumatic.     Mouth/Throat:     Mouth: Mucous membranes are moist.     Comments: Tonsillar mass is noted. Cardiovascular:     Rate and Rhythm: Normal rate and regular rhythm.     Heart sounds: No murmur heard.   No friction rub.  Pulmonary:     Effort: Pulmonary effort is normal. No respiratory distress.     Breath sounds: Normal breath sounds. No stridor. No wheezing or rales.  Abdominal:     General: Bowel sounds are normal. There is no distension.     Palpations: Abdomen is soft.     Tenderness: There is no abdominal tenderness.   Musculoskeletal:        General: Normal range of motion.     Cervical back: Normal range of motion and neck supple.  Skin:    General: Skin is warm and dry.  Neurological:     Mental Status: He is alert and oriented to person, place, and time.     Coordination: Coordination normal.    ED Results / Procedures / Treatments   Labs (all labs ordered are listed, but only abnormal results are displayed) Labs Reviewed - No data to display  EKG None  Radiology No results found.  Procedures Procedures   Medications Ordered in ED Medications - No data to display  ED Course  I have reviewed the triage vital signs and the nursing notes.  Pertinent labs & imaging results that were available during my care of the patient were reviewed by me and considered in my medical decision making (see chart for details).    MDM Rules/Calculators/A&P  Patient sent from his extended care facility for evaluation of throat irritation.  Patient has a history of both dementia and carcinoma of the right tonsil for which he has elected not to pursue treatment.  Patient currently on hospice care.  Patient arrives here with stable vital signs, normal oxygen saturations, and no stridor or signs of respiratory distress.  I did obtain a CT scan which shows increased in the size of the tonsillar carcinoma.  Patient is in no respiratory distress and I do not feel as though anything emergent is occurring.  Patient to be discharged back to his extended care facility.  Final Clinical Impression(s) / ED Diagnoses Final diagnoses:  None    Rx / [place] Orders ED Discharge Orders     None        Veryl Speak, MD 06/21/21 [phone]

## 2021-06-21 NOTE — ED Notes (Signed)
Pt a//w EMS transport

## 2021-06-21 NOTE — ED Notes (Signed)
Pt in bed, pt offers no complaints at this time, resps even and unlabored, pt has scratchy voice,  pt awaits a ride home.  Call bell within reach.

## 2021-06-21 NOTE — ED Notes (Signed)
This RN contacted [facility] to inquire of status of transport to facility, per dispatcher ems does not have any trucks at the moment but are aware of need for transport.

## 2021-06-21 NOTE — ED Triage Notes (Signed)
[facility] from [facility]. Pt is seen by Hospice for Tonsil Cancer. Says that today he has been feeling like there has been something stuck in his throat all day. Facility talked with hospice nurse and they said that if he wants to get it checked out by an edp he can.

## 2021-06-21 NOTE — ED Notes (Signed)
Patient transported to CT 

## 2021-06-21 NOTE — ED Notes (Signed)
Pt repositioned in bed and provided with new warm blankets. Pt denies any complaints or other needs at this time.

## 2021-06-21 NOTE — Discharge Instructions (Addendum)
Follow-up with your primary doctor/oncologist in the next few days.  Continue medications as previously prescribed.

## 2021-06-21 NOTE — ED Notes (Signed)
Patient provided with soft foods and drink.

## 2021-06-22 LAB — CBG MONITORING, ED: Glucose-Capillary: 82 mg/dL (ref 70–99)

## 2021-06-22 MED ORDER — TRAMADOL HCL 50 MG PO TABS
50.0000 mg | ORAL_TABLET | Freq: Once | ORAL | Status: AC
  Administered 2021-06-22: 50 mg via ORAL
  Filled 2021-06-22: qty 1

## 2021-06-22 NOTE — ED Notes (Signed)
EMS here to take pt to [facility]

## 2021-06-22 NOTE — ED Notes (Signed)
  Patient CBG checked after hypoglycemic event earlier in the shift.  CBG - 82.  Patient is awake, alert, and watching tv.  Attempted to give patient something to eat but he stated he didn't want anything.  I talked him into drinking a chocolate Ensure shake and patient tolerating well.

## 2021-06-22 NOTE — ED Notes (Signed)
  Patient alert and watching tv.  Drank almost all of the Ensure shake.  No complaints of pain.

## 2021-06-22 NOTE — ED Notes (Signed)
This RN spoke with EMS dispatcher to inquire about transport, Dispatcher advised that a convalescent truck would be operating today starting at [place] and that this pt would be transported first this am, Information relayed to [employer] in ed.

## 2021-07-10 ENCOUNTER — Other Ambulatory Visit: Payer: Self-pay

## 2021-07-10 ENCOUNTER — Encounter (HOSPITAL_COMMUNITY): Payer: Self-pay

## 2021-07-10 ENCOUNTER — Emergency Department (HOSPITAL_COMMUNITY)
Admission: EM | Admit: 2021-07-10 | Discharge: 2021-07-10 | Disposition: A | Discharge status: Home / Self Care | Attending: Emergency Medicine | Admitting: Emergency Medicine

## 2021-07-10 DIAGNOSIS — F0390 Unspecified dementia without behavioral disturbance: Secondary | ICD-10-CM | POA: Insufficient documentation

## 2021-07-10 DIAGNOSIS — F17210 Nicotine dependence, cigarettes, uncomplicated: Secondary | ICD-10-CM | POA: Insufficient documentation

## 2021-07-10 DIAGNOSIS — Z85118 Personal history of other malignant neoplasm of bronchus and lung: Secondary | ICD-10-CM | POA: Diagnosis not present

## 2021-07-10 DIAGNOSIS — J441 Chronic obstructive pulmonary disease with (acute) exacerbation: Secondary | ICD-10-CM | POA: Insufficient documentation

## 2021-07-10 DIAGNOSIS — Z79899 Other long term (current) drug therapy: Secondary | ICD-10-CM | POA: Diagnosis not present

## 2021-07-10 DIAGNOSIS — R531 Weakness: Secondary | ICD-10-CM | POA: Insufficient documentation

## 2021-07-10 DIAGNOSIS — Z85818 Personal history of malignant neoplasm of other sites of lip, oral cavity, and pharynx: Secondary | ICD-10-CM | POA: Diagnosis not present

## 2021-07-10 LAB — I-STAT CHEM 8, ED
BUN: 12 mg/dL (ref 8–23)
Calcium, Ion: 0.98 mmol/L — ABNORMAL LOW (ref 1.15–1.40)
Chloride: 104 mmol/L (ref 98–111)
Creatinine, Ser: 0.9 mg/dL (ref 0.61–1.24)
Glucose, Bld: 88 mg/dL (ref 70–99)
HCT: 41 % (ref 39.0–52.0)
Hemoglobin: 13.9 g/dL (ref 13.0–17.0)
Potassium: 4.9 mmol/L (ref 3.5–5.1)
Sodium: 136 mmol/L (ref 135–145)
TCO2: 24 mmol/L (ref 22–32)

## 2021-07-10 MED ORDER — OXYCODONE-ACETAMINOPHEN 5-325 MG PO TABS
1.0000 | ORAL_TABLET | Freq: Once | ORAL | Status: AC
  Administered 2021-07-10: 1 via ORAL
  Filled 2021-07-10: qty 1

## 2021-07-10 NOTE — ED Provider Notes (Signed)
[facility] Provider Note   CSN: [id number] Arrival date & time: 07/10/21  1333     History Chief Complaint  Patient presents with   Hypotension    Keith Roberson is a 70 y.o. male.  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  His only complaint to me is that he has pain in the right side of his face.  He cannot give additional history.  Level 5 caveat-poor historian    Past Medical History:  Diagnosis Date   Cancer ([place])    lung   COPD (chronic obstructive pulmonary disease) ([place])    Dementia ([place])    Dyspnea    Neck mass 04/21/2020   Seizures ([place])     Patient Active Problem List   Diagnosis Date Noted   Mass of right side of neck 09/10/2020   Neck mass 04/21/2020   Respiratory failure with hypoxia ([place]) 10/04/2019   COPD with acute exacerbation ([place]) 10/04/2019   AKI (acute kidney injury) ([place]) 10/04/2019   Alcohol abuse 10/06/2018   Alcohol intoxication ([place]) 10/06/2018   Dementia ([place]) 10/06/2018   Seizures ([place]) 10/06/2018   Special screening for malignant neoplasms, colon 10/23/2016    History reviewed. No pertinent surgical history.     Family History  Problem Relation Age of Onset   Breast cancer Mother    Prostate cancer Father     Social History   Tobacco Use   Smoking status: Every Day    Packs/day: 0.50    Types: Cigarettes   Smokeless tobacco: Never  Vaping Use   Vaping Use: Never used  Substance Use Topics   Alcohol use: Yes    Comment: occ   Drug use: No    Home Medications Prior to Admission medications   Medication Sig Start Date End Date Taking? Authorizing Provider  albuterol (VENTOLIN HFA) 108 (90 Base) MCG/ACT inhaler Inhale 2 puffs into the lungs every 4 (four) hours as needed for wheezing or shortness of breath. Patient not taking: Reported on 11/22/2020 05/28/20    Wyvonnia Dusky, MD  LORazepam (ATIVAN) 0.5 MG tablet 1 tablet as needed every for hours 05/11/20   [provider]  naproxen (NAPROSYN) 375 MG tablet Take 1 tablet (375 mg total) by mouth 2 (two) times daily. 09/23/20   Varney Biles, MD  ondansetron (ZOFRAN ODT) 4 MG disintegrating tablet Take 1 tablet (4 mg total) by mouth every 8 (eight) hours as needed. 12/24/20   Fredia Sorrow, MD  traMADol (ULTRAM) 50 MG tablet Take 1 tablet (50 mg total) by mouth every 12 (twelve) hours as needed. 09/21/20   Derek Jack, MD    Allergies    Patient has no known allergies.  Review of Systems   Review of Systems  Unable to perform ROS: Mental status change   Physical Exam Updated Vital Signs BP (!) 153/82 (BP Location: Left Arm)   Pulse 70   Temp 97.6 F (36.4 C) (Oral)   Resp 16   Ht 5\' 11"  (1.803 m)   Wt 64 kg   SpO2 100%   BMI 19.68 kg/m   Physical Exam Vitals and nursing note reviewed.  Constitutional:      General: He is not in acute distress.    Appearance: He is well-developed. He is not ill-appearing, toxic-appearing or diaphoretic.     Comments: Elderly,  frail  HENT:     Head: Normocephalic and atraumatic.     Right Ear: External ear normal.     Left Ear: External ear normal.     Mouth/Throat:     Comments: Right mass, right tonsillar area, without deformity of the tongue, or other oropharyngeal structures.  No stridor, oral airway appears satisfactory. Eyes:     Conjunctiva/sclera: Conjunctivae normal.     Pupils: Pupils are equal, round, and reactive to light.  Neck:     Trachea: Phonation normal.  Cardiovascular:     Rate and Rhythm: Normal rate and regular rhythm.     Heart sounds: Normal heart sounds.  Pulmonary:     Effort: Pulmonary effort is normal. No respiratory distress.     Breath sounds: Normal breath sounds.  Abdominal:     General: There is no distension.     Palpations: Abdomen is soft.     Tenderness: There is no abdominal  tenderness.  Musculoskeletal:        General: Normal range of motion.     Cervical back: Normal range of motion and neck supple.  Skin:    General: Skin is warm and dry.  Neurological:     Mental Status: He is alert.     Cranial Nerves: No cranial nerve deficit.     Motor: No abnormal muscle tone.     Coordination: Coordination normal.  Psychiatric:        Mood and Affect: Mood normal.        Behavior: Behavior normal.    ED Results / Procedures / Treatments   Labs (all labs ordered are listed, but only abnormal results are displayed) Labs Reviewed  I-STAT CHEM 8, ED - Abnormal; Notable for the following components:      Result Value   Calcium, Ion 0.98 (*)    All other components within normal limits    EKG None  Radiology No results found.  Procedures Procedures   Medications Ordered in ED Medications  oxyCODONE-acetaminophen (PERCOCET/ROXICET) 5-325 MG per tablet 1 tablet (1 tablet Oral Given 07/10/21 1651)    ED Course  I have reviewed the triage vital signs and the nursing notes.  Pertinent labs & imaging results that were available during my care of the patient were reviewed by me and considered in my medical decision making (see chart for details).  Clinical Course as of 07/10/21 2029  Tue Jul 10, 2021  [place] 8 results, 1440, normal.  Initially did not crossover. [EW]    Clinical Course User Index [EW] Daleen Bo, MD   MDM Rules/Calculators/A&P                            Patient Vitals for the past 24 hrs:  BP Temp Temp src Pulse Resp SpO2 Height Weight  07/10/21 1607 (!) 153/82 97.6 F (36.4 C) Oral 70 16 100 % -- --  07/10/21 1530 (!) 153/80 -- -- -- 11 -- -- --  07/10/21 1500 (!) 162/85 -- -- -- 13 -- -- --  07/10/21 1430 (!) 159/77 -- -- -- 12 -- -- --  07/10/21 1428 -- (!) 96 F (35.6 C) Rectal -- -- -- -- --  07/10/21 1415 (!) 147/75 -- -- -- 18 -- -- --  07/10/21 1400 (!) 154/89 -- -- -- 16 -- -- --  07/10/21 1350 -- -- -- --  -- -- 5\' 11"  (1.803 m) 64 kg  07/10/21 1348 -- -- -- -- --  100 % -- --  07/10/21 1342 (!) 168/79 97.6 F (36.4 C) Oral (!) 58 18 -- -- --    At the time of discharge -reevaluation with update and discussion. After initial assessment and treatment, an updated evaluation reveals no change in clinical status. Daleen Bo   Medical Decision Making:  This patient is presenting for evaluation of a period of low blood pressure while transferring, which does require a range of treatment options, and is a complaint that involves a moderate risk of morbidity and mortality. The differential diagnoses include acute illness, hypoglycemia, hypovolemia. 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  He also has dementia, seizures and history of respiratory failure with alcohol abuse..    Clinical Laboratory Tests Ordered, included  i-STAT 8 . Review indicates normal.   Critical Interventions-clinical evaluation, laboratory testing, orthostatic blood pressure and pulses, observation and reassessment  After These Interventions, the Patient was reevaluated and was found with reassuring clinical evaluation including normal orthostatics.  Patient was unable to stand due to his chronic bedbound status.  Screening labs are normal.  Vital signs are reassuring.  No indication for hospitalization.  CRITICAL CARE-no Performed by: Daleen Bo  Nursing Notes Reviewed/ Care Coordinated Applicable Imaging Reviewed Interpretation of Laboratory Data incorporated into ED treatment  The patient appears reasonably screened and/or stabilized for discharge and I doubt any other medical condition or other [facility] requiring further screening, evaluation, or treatment in the ED at this time prior to discharge.  Plan: Home Medications-continue usual; Home Treatments-regular activity; return here if the recommended treatment, does not improve the symptoms;  Recommended follow up-PCP, as needed     Final Clinical Impression(s) / ED Diagnoses Final diagnoses:  Weakness    Rx / DC Orders ED Discharge Orders     None        Daleen Bo, MD 07/10/21 2029

## 2021-07-10 NOTE — ED Notes (Signed)
Report given to [facility] at [facility].

## 2021-07-10 NOTE — ED Triage Notes (Signed)
Pt arrived [facility] from [facility] . Pt DNR at bedside. Family called EMS due to pt AMS, and diagnosis of throat cancer. EMS stated pt became Orthostatic hypotension during transfer. 106/72 sitting up and then standing to 60/40. BS 107 per EMS

## 2021-07-10 NOTE — Discharge Instructions (Addendum)
There has been no persistent hypotension, or abnormal findings on the exam or testing.  Continue your current treatment, and make sure you are eating and drinking well.

## 2021-07-13 ENCOUNTER — Encounter (HOSPITAL_COMMUNITY): Payer: Self-pay

## 2021-07-13 ENCOUNTER — Emergency Department (HOSPITAL_COMMUNITY)

## 2021-07-13 ENCOUNTER — Emergency Department (HOSPITAL_COMMUNITY)
Admission: EM | Admit: 2021-07-13 | Discharge: 2021-07-14 | Disposition: A | Discharge status: Home / Self Care | Attending: Emergency Medicine | Admitting: Emergency Medicine

## 2021-07-13 ENCOUNTER — Other Ambulatory Visit: Payer: Self-pay

## 2021-07-13 DIAGNOSIS — F0390 Unspecified dementia without behavioral disturbance: Secondary | ICD-10-CM | POA: Insufficient documentation

## 2021-07-13 DIAGNOSIS — F17210 Nicotine dependence, cigarettes, uncomplicated: Secondary | ICD-10-CM | POA: Insufficient documentation

## 2021-07-13 DIAGNOSIS — R55 Syncope and collapse: Secondary | ICD-10-CM | POA: Diagnosis present

## 2021-07-13 DIAGNOSIS — R0602 Shortness of breath: Secondary | ICD-10-CM | POA: Insufficient documentation

## 2021-07-13 DIAGNOSIS — J449 Chronic obstructive pulmonary disease, unspecified: Secondary | ICD-10-CM | POA: Diagnosis not present

## 2021-07-13 DIAGNOSIS — Z85118 Personal history of other malignant neoplasm of bronchus and lung: Secondary | ICD-10-CM | POA: Diagnosis not present

## 2021-07-13 LAB — BASIC METABOLIC PANEL
Anion gap: 10 (ref 5–15)
BUN: 13 mg/dL (ref 8–23)
CO2: 29 mmol/L (ref 22–32)
Calcium: 9.4 mg/dL (ref 8.9–10.3)
Chloride: 101 mmol/L (ref 98–111)
Creatinine, Ser: 0.86 mg/dL (ref 0.61–1.24)
GFR, Estimated: >60 mL/min (ref >60)
Glucose, Bld: 85 mg/dL (ref 70–99)
Potassium: 4.3 mmol/L (ref 3.5–5.1)
Sodium: 140 mmol/L (ref 135–145)

## 2021-07-13 LAB — CBG MONITORING, ED
Glucose-Capillary: 47 mg/dL — ABNORMAL LOW (ref 70–99)
Glucose-Capillary: 196 mg/dL — ABNORMAL HIGH (ref 70–99)

## 2021-07-13 LAB — URINALYSIS, ROUTINE W REFLEX MICROSCOPIC
Bilirubin Urine: NEGATIVE
Glucose, UA: 50 mg/dL — AB
Hgb urine dipstick: NEGATIVE
Ketones, ur: 20 mg/dL — AB
Leukocytes,Ua: NEGATIVE
Nitrite: NEGATIVE
Protein, ur: NEGATIVE mg/dL
Specific Gravity, Urine: 1.018 (ref 1.005–1.030)
pH: 6 (ref 5.0–8.0)

## 2021-07-13 LAB — CBC
HCT: 35.7 % — ABNORMAL LOW (ref 39.0–52.0)
Hemoglobin: 11 g/dL — ABNORMAL LOW (ref 13.0–17.0)
MCH: 31.1 pg (ref 26.0–34.0)
MCHC: 30.8 g/dL (ref 30.0–36.0)
MCV: 100.8 fL — ABNORMAL HIGH (ref 80.0–100.0)
Platelets: 224 10*3/uL (ref 150–400)
RBC: 3.54 MIL/uL — ABNORMAL LOW (ref 4.22–5.81)
RDW: 12.8 % (ref 11.5–15.5)
WBC: 8.7 10*3/uL (ref 4.0–10.5)
nRBC: 0 % (ref 0.0–0.2)

## 2021-07-13 MED ORDER — DEXTROSE 50 % IV SOLN: 50.0000 mL | Freq: Once | INTRAVENOUS | Status: AC

## 2021-07-13 MED ORDER — DEXTROSE 50 % IV SOLN
INTRAVENOUS | Status: AC
  Administered 2021-07-13: 50 mL via INTRAVENOUS
  Filled 2021-07-13: qty 50

## 2021-07-13 MED ORDER — SODIUM CHLORIDE 0.9 % IV BOLUS
1000.0000 mL | Freq: Once | INTRAVENOUS | Status: AC
  Administered 2021-07-13: 1000 mL via INTRAVENOUS

## 2021-07-13 NOTE — ED Notes (Signed)
Pt given d50 as ordered for bgl 47 and pt given apple sauce and ensure.

## 2021-07-13 NOTE — ED Provider Notes (Signed)
[facility] EMERGENCY DEPARTMENT Provider Note   CSN: [id number] Arrival date & time: 07/13/21  1354     History Chief Complaint  Patient presents with   Loss of Consciousness    Keith Roberson is a 70 y.o. male.  Patient has syncopal episode today.  Patient with lung cancer.  Patient not eating or drinking very much  The history is provided by the patient and medical records. No language interpreter was used.  Loss of Consciousness Episode history:  Single Most recent episode:  Today Timing:  Unable to specify Progression:  Resolved Chronicity:  New Context: not blood draw   Witnessed: yes   Relieved by:  Nothing Worsened by:  Nothing Ineffective treatments:  None tried Associated symptoms: no anxiety, no chest pain, no headaches and no seizures       Past Medical History:  Diagnosis Date   Cancer ([place])    lung   COPD (chronic obstructive pulmonary disease) ([place])    Dementia ([facility])    Dyspnea    Neck mass 04/21/2020   Seizures ([place])     Patient Active Problem List   Diagnosis Date Noted   Mass of right side of neck 09/10/2020   Neck mass 04/21/2020   Respiratory failure with hypoxia ([place]) 10/04/2019   COPD with acute exacerbation ([place]) 10/04/2019   AKI (acute kidney injury) ([place]) 10/04/2019   Alcohol abuse 10/06/2018   Alcohol intoxication ([place]) 10/06/2018   Dementia ([place]) 10/06/2018   Seizures ([place]) 10/06/2018   Special screening for malignant neoplasms, colon 10/23/2016    History reviewed. No pertinent surgical history.     Family History  Problem Relation Age of Onset   Breast cancer Mother    Prostate cancer Father     Social History   Tobacco Use   Smoking status: Every Day    Packs/day: 0.50    Types: Cigarettes   Smokeless tobacco: Never  Vaping Use   Vaping Use: Never used  Substance Use Topics   Alcohol use: Yes    Comment: occ   Drug use: No    Home Medications Prior to Admission medications   Medication Sig Start Date End  Date Taking? Authorizing Provider  acetaminophen (TYLENOL) 500 MG tablet Take 1,000 mg by mouth every 8 (eight) hours.   Yes [provider]  acetaminophen (TYLENOL) 500 MG tablet Take 500 mg by mouth every 6 (six) hours as needed for headache or fever.   Yes [provider]  alum & mag hydroxide-simeth (MAALOX/MYLANTA) 200-200-20 MG/5ML suspension Take 30 mLs by mouth as needed for heartburn or indigestion.   Yes [provider]  escitalopram (LEXAPRO) 5 MG tablet Take 5 mg by mouth daily.   Yes [provider]  guaifenesin (ROBITUSSIN) 100 MG/5ML syrup Take 200 mg by mouth every 6 (six) hours as needed for cough.   Yes [provider]  loperamide (IMODIUM) 2 MG capsule Take 2 mg by mouth as needed for diarrhea or loose stools.   Yes [provider]  LORazepam (ATIVAN) 0.5 MG tablet Take 0.25 mg by mouth every 12 (twelve) hours. 05/11/20  Yes [provider]  LORazepam (ATIVAN) 0.5 MG tablet Take 0.5 mg by mouth every 6 (six) hours as needed for anxiety.   Yes [provider]  magnesium hydroxide (MILK OF MAGNESIA) 400 MG/5ML suspension Take 30 mLs by mouth at bedtime as needed for mild constipation.   Yes [provider]  Morphine Sulfate (MORPHINE CONCENTRATE) 10 mg /  0.5 ml concentrated solution Take 10 mg by mouth every 4 (four) hours as needed for severe pain or shortness of breath.   Yes [provider]  neomycin-bacitracin-polymyxin (NEOSPORIN) [phone] ointment Apply 1 application topically See admin instructions. For minor skin tears or abrasions, clean area with normal saline, apply neosporin then cover with band-aid or gauze and tape change daily PRN   Yes [provider]  oxyCODONE (OXY IR/ROXICODONE) 5 MG immediate release tablet Take 5 mg by mouth every 6 (six) hours.   Yes [provider]  polyethylene glycol (MIRALAX / GLYCOLAX) 17 g packet Take 17 g by mouth every 4 (four) hours  as needed (constipation).   Yes [provider]  salicylic acid-lactic acid 17 % external solution Apply 3 drops topically See admin instructions. Soak feet in warm water for 5 min, instill 3 drops topically twice a day to each plantar wart   Yes [provider]  senna-docusate (SENOKOT-S) 8.6-50 MG tablet Take 2 tablets by mouth at bedtime.   Yes [provider]  senna-docusate (SENOKOT-S) 8.6-50 MG tablet Take 1 tablet by mouth 2 (two) times daily as needed for mild constipation.   Yes [provider]  albuterol (VENTOLIN HFA) 108 (90 Base) MCG/ACT inhaler Inhale 2 puffs into the lungs every 4 (four) hours as needed for wheezing or shortness of breath. Patient not taking: No sig reported 05/28/20   Wyvonnia Dusky, MD  naproxen (NAPROSYN) 375 MG tablet Take 1 tablet (375 mg total) by mouth 2 (two) times daily. Patient not taking: No sig reported 09/23/20   Varney Biles, MD  ondansetron (ZOFRAN ODT) 4 MG disintegrating tablet Take 1 tablet (4 mg total) by mouth every 8 (eight) hours as needed. Patient not taking: No sig reported 12/24/20   Fredia Sorrow, MD  traMADol (ULTRAM) 50 MG tablet Take 1 tablet (50 mg total) by mouth every 12 (twelve) hours as needed. Patient not taking: No sig reported 09/21/20   Derek Jack, MD    Allergies    Patient has no known allergies.  Review of Systems   Review of Systems  Constitutional:  Negative for appetite change and fatigue.  HENT:  Negative for congestion, ear discharge and sinus pressure.   Eyes:  Negative for discharge.  Respiratory:  Negative for cough.   Cardiovascular:  Positive for syncope. Negative for chest pain.  Gastrointestinal:  Negative for abdominal pain and diarrhea.  Genitourinary:  Negative for frequency and hematuria.  Musculoskeletal:  Negative for back pain.  Skin:  Negative for rash.  Neurological:  Negative for seizures and headaches.       Syncope  Psychiatric/Behavioral:   Negative for hallucinations.    Physical Exam Updated Vital Signs BP (!) 151/85   Pulse 71   Temp (!) 97.5 F (36.4 C)   Resp 13   Ht 5\' 11"  (1.803 m)   Wt 64 kg   SpO2 98%   BMI 19.68 kg/m   Physical Exam Vitals and nursing note reviewed.  Constitutional:      Appearance: He is well-developed.  HENT:     Head: Normocephalic.     Nose: Nose normal.  Eyes:     General: No scleral icterus.    Conjunctiva/sclera: Conjunctivae normal.  Neck:     Thyroid: No thyromegaly.  Cardiovascular:     Rate and Rhythm: Normal rate and regular rhythm.     Heart sounds: No murmur heard.   No friction rub. No gallop.  Pulmonary:  Breath sounds: No stridor. No wheezing or rales.  Chest:     Chest wall: No tenderness.  Abdominal:     General: There is no distension.     Tenderness: There is no abdominal tenderness. There is no rebound.  Musculoskeletal:        General: Normal range of motion.     Cervical back: Neck supple.  Lymphadenopathy:     Cervical: No cervical adenopathy.  Skin:    Findings: No erythema or rash.  Neurological:     Mental Status: He is alert and oriented to person, place, and time.     Motor: No abnormal muscle tone.     Coordination: Coordination normal.  Psychiatric:        Behavior: Behavior normal.    ED Results / Procedures / Treatments   Labs (all labs ordered are listed, but only abnormal results are displayed) Labs Reviewed  CBC - Abnormal; Notable for the following components:      Result Value   RBC 3.54 (*)    Hemoglobin 11.0 (*)    HCT 35.7 (*)    MCV 100.8 (*)    All other components within normal limits  URINALYSIS, ROUTINE W REFLEX MICROSCOPIC - Abnormal; Notable for the following components:   Glucose, UA 50 (*)    Ketones, ur 20 (*)    All other components within normal limits  CBG MONITORING, ED - Abnormal; Notable for the following components:   Glucose-Capillary 47 (*)    All other components within normal limits  CBG  MONITORING, ED - Abnormal; Notable for the following components:   Glucose-Capillary 196 (*)    All other components within normal limits  BASIC METABOLIC PANEL    EKG EKG Interpretation  Date/Time:  Friday July 13 2021 14:08:25 EDT Ventricular Rate:  75 PR Interval:  193 QRS Duration: 77 QT Interval:  373 QTC Calculation: 417 R Axis:   73 Text Interpretation: Sinus rhythm Nonspecific T abnrm, anterolateral leads ST elevation, consider inferior injury Confirmed by Milton Ferguson [phone]) on 07/13/2021 5:20:28 PM  Radiology DG Chest Port 1 View  Result Date: 07/13/2021 CLINICAL DATA:  Shortness of breath EXAM: PORTABLE CHEST 1 VIEW COMPARISON:  CT 02/11/1999 22, radiograph 42 02/11/2021 FINDINGS: Hyperinflation with chronic interstitial opacity and bronchiectasis. No acute airspace disease. Emphysema. Normal cardiomediastinal silhouette. No pneumothorax. IMPRESSION: No active disease.  Hyperinflation with emphysema Electronically Signed   By: Donavan Foil M.D.   On: 07/13/2021 18:03    Procedures Procedures   Medications Ordered in ED Medications  sodium chloride 0.9 % bolus 1,000 mL (0 mLs Intravenous Stopped 07/13/21 1900)  dextrose 50 % solution 50 mL (50 mLs Intravenous Given 07/13/21 1754)    ED Course  I have reviewed the triage vital signs and the nursing notes.  Pertinent labs & imaging results that were available during my care of the patient were reviewed by me and considered in my medical decision making (see chart for details).    MDM Rules/Calculators/A&P                           Patient with syncopal episode secondary to dehydration and not eating enough and low glucose.  Patient improved with treatment.  Patient was told that he needs to eat something if not a full meal 3 times a day then small meals 6 times a day.  He is also going to drink more..  Patient also had hypoglycemia  from not eating Final Clinical Impression(s) / ED Diagnoses Final diagnoses:   None    Rx / DC Orders ED Discharge Orders     None        Milton Ferguson, MD 07/16/21 1105

## 2021-07-13 NOTE — ED Triage Notes (Signed)
Pt bib ems from [place] for syncope.  Pt was hypotensive and bradycardic on ems arrival.  Pt is more alert now.  Pt bp increased when laying flat by ems.  Pt is alert to self.  nad

## 2021-07-13 NOTE — Discharge Instructions (Addendum)
Drink plenty of fluids so your urine is almost clear.  Also either eat 3 meals a day or 6 small meals.  Follow-up with your doctor next week for recheck

## 2021-07-13 NOTE — Progress Notes (Signed)
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 Pt is a resident at [facility].  [facility] will continue to follow for any discharge planning needs and to coordinate continuation of hospice care.    Thank you for the opportunity to participate in this patient's care.     Domenic Moras, BSN, RN [facility] Liaison [phone] (24h on call)

## 2021-07-13 NOTE — ED Notes (Signed)
RN notified of CBG. 

## 2021-09-20 DEATH — deceased

## 2021-12-30 IMAGING — DX DG CHEST 1V PORT
1 series · 1 of 1 positions shown · non-contrast
Comparison: July 23, 2020

CLINICAL DATA: Vomiting and not eating for several days.

EXAM:
PORTABLE CHEST 1 VIEW

[chest ap]
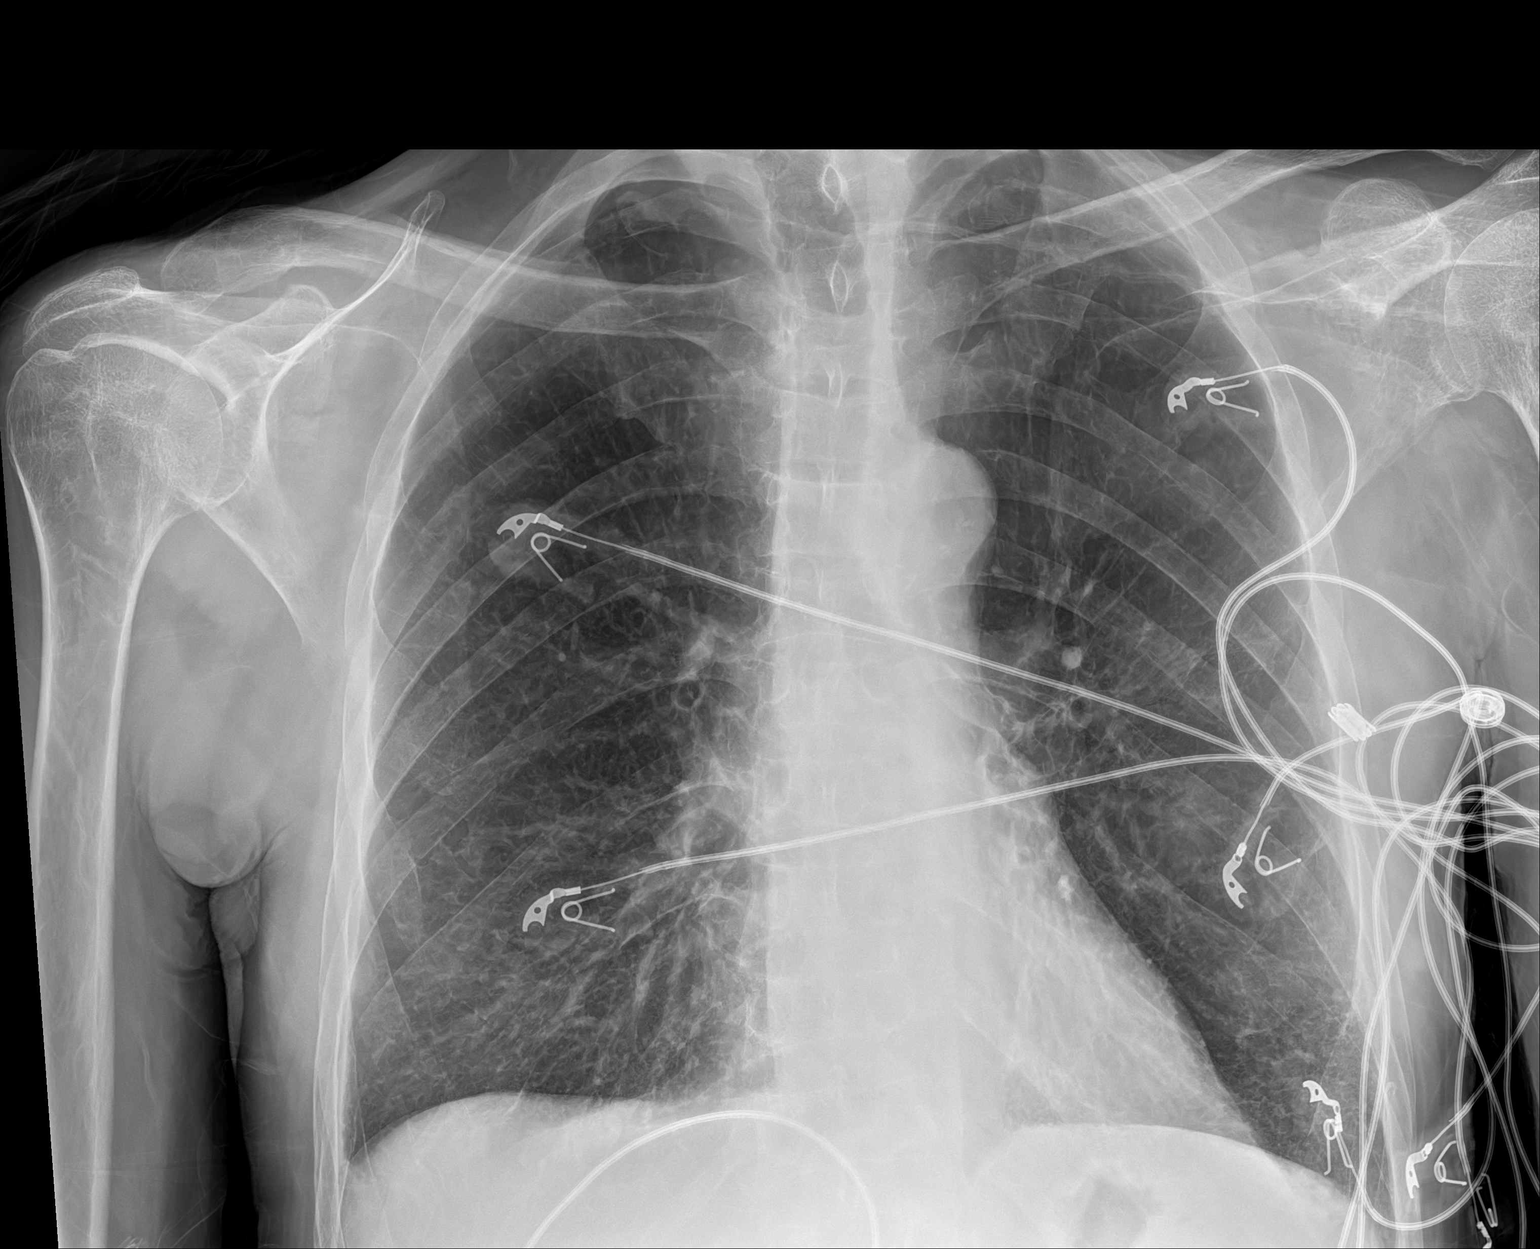

[1 of 1 positions shown; findings below may reference images not displayed]

FINDINGS: The lungs are hyperinflated. Diffuse, chronic appearing increased
lung markings are seen. There is no evidence of acute infiltrate,
pleural effusion or pneumothorax. The heart size and mediastinal
contours are within normal limits. The visualized skeletal
structures are unremarkable.
IMPRESSION: Chronic appearing increased lung markings without evidence of acute
or active cardiopulmonary disease.

## 2022-06-27 IMAGING — CT CT NECK W/ CM
3 series · 16 of 33 positions shown, 19 images · IV contrast (Omnipaque or Isovue)
Comparison: None.

CLINICAL DATA: Non pulsatile neck mass

EXAM:
CT NECK WITH CONTRAST
TECHNIQUE: Multidetector CT imaging of the neck was performed using the
standard protocol following the bolus administration of intravenous
contrast.
CONTRAST:  75mL OMNIPAQUE IOHEXOL 350 MG/ML SOLN

[Series 2: axial neck · axial · 0.43mm/px · z∈[-245,-31]mm · 8 of 118 slices shown, 10 images]
[im 10/118  soft-tissue]
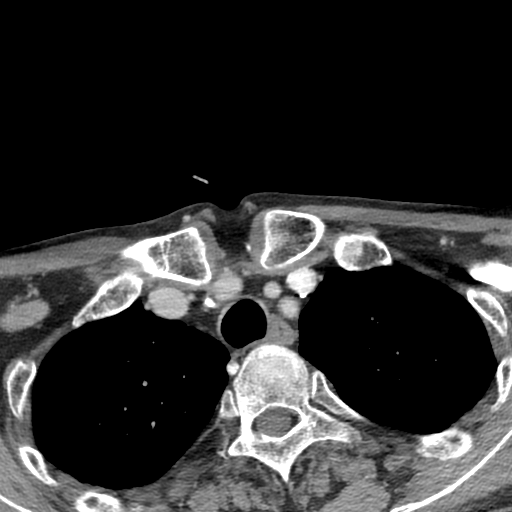
[im 10/118  bone]
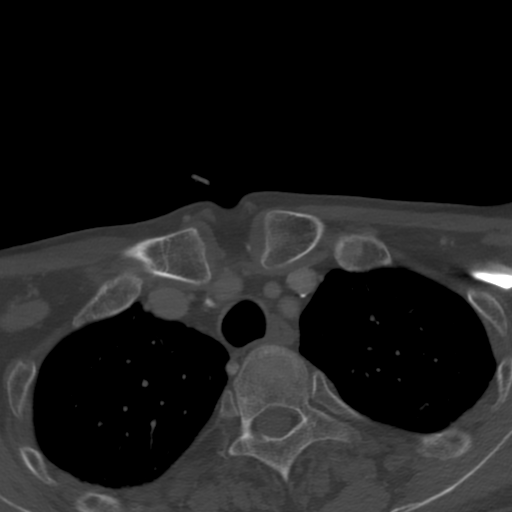
[im 28/118  bone]
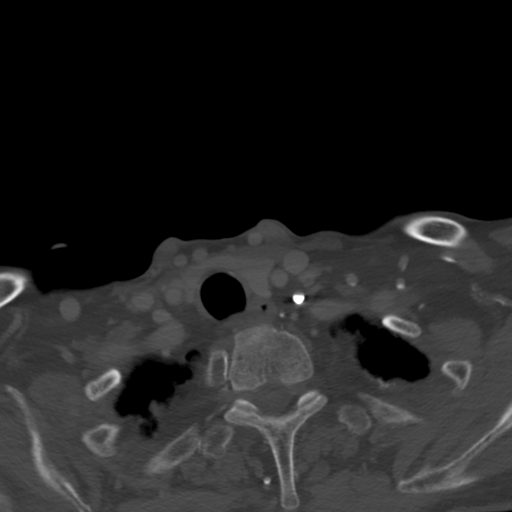
[im 37/118  bone]
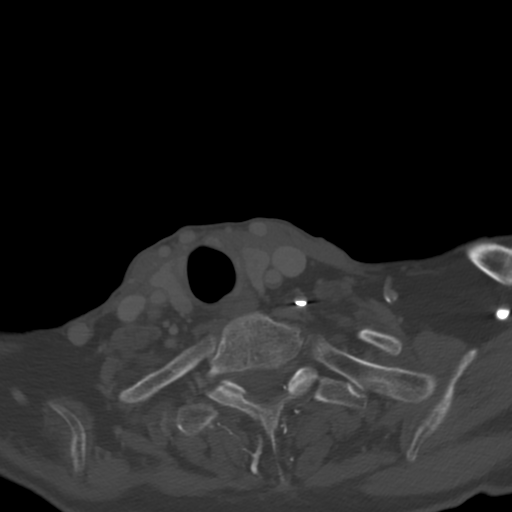
[im 55/118  bone]
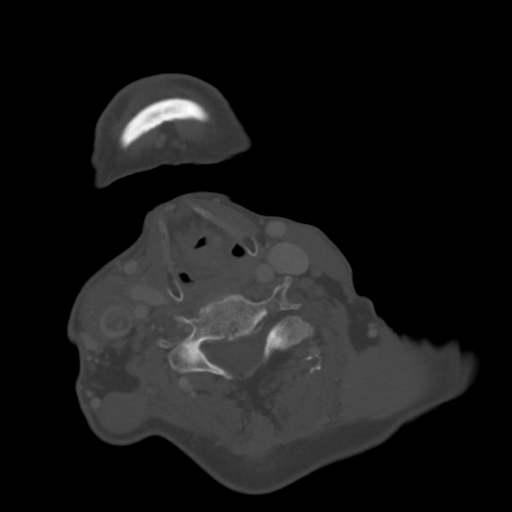
[im 64/118  soft-tissue]
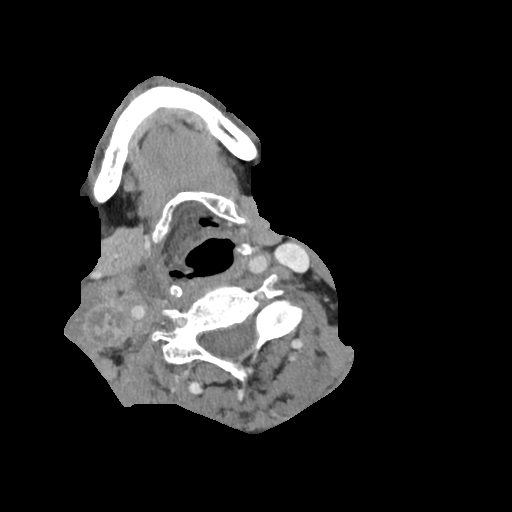
[im 64/118  bone]
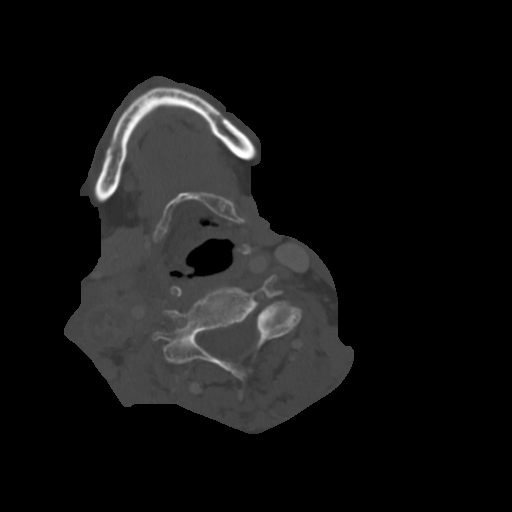
[im 82/118  bone]
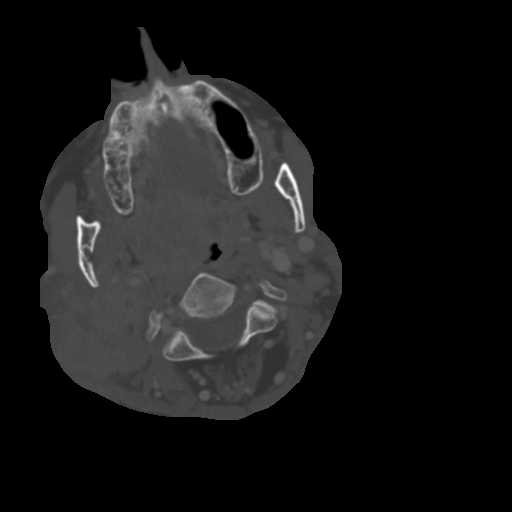
[im 91/118  bone]
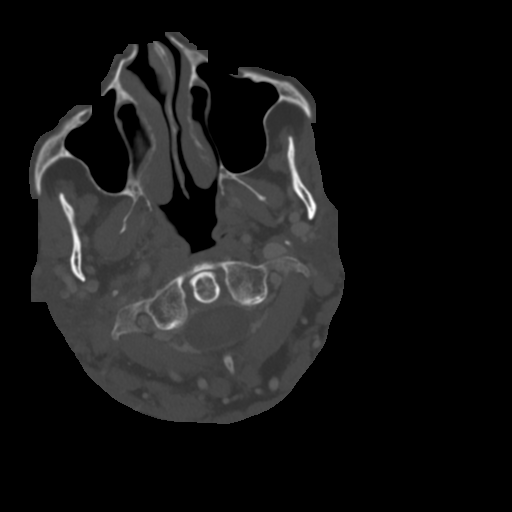
[im 109/118  bone]
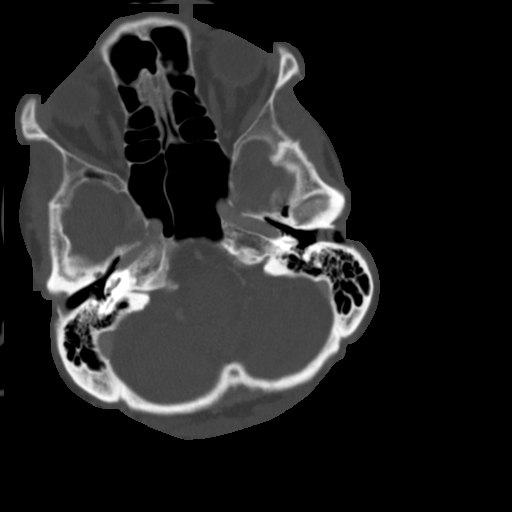

[Series 4: cor neck · coronal · 0.48mm/px · 3 of 99 slices shown]
[im 20/99  bone]
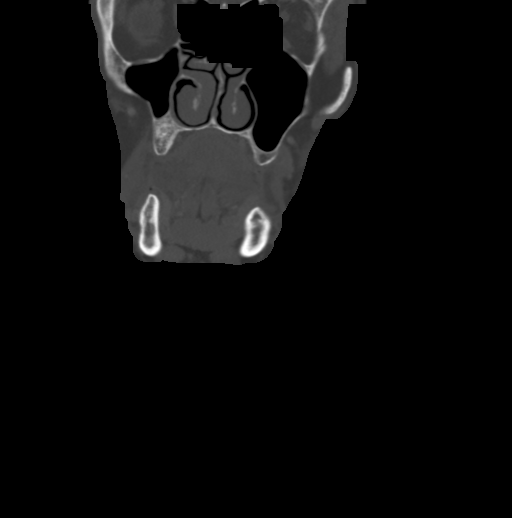
[im 40/99  bone]
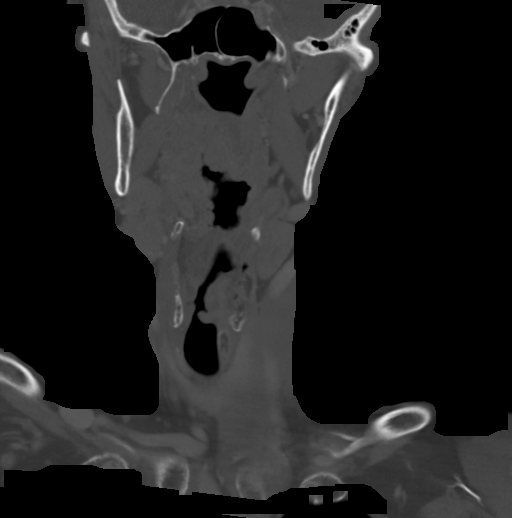
[im 59/99  bone]
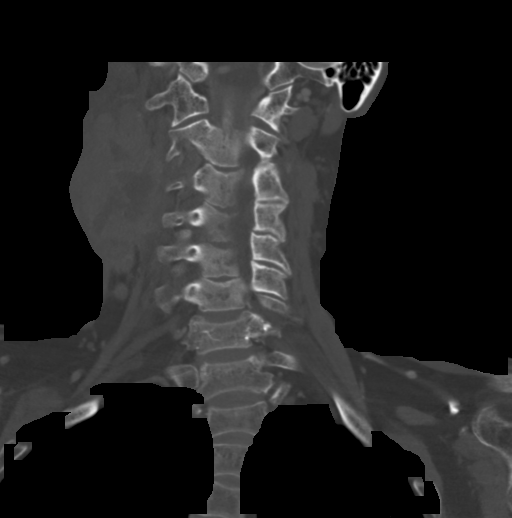

[Series 5: sag neck · sagittal · 0.48mm/px · 5 of 101 slices shown, 6 images]
[im 34/101  bone]
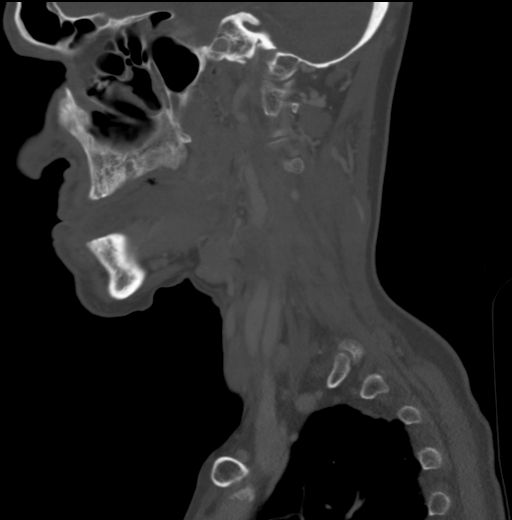
[im 42/101  bone]
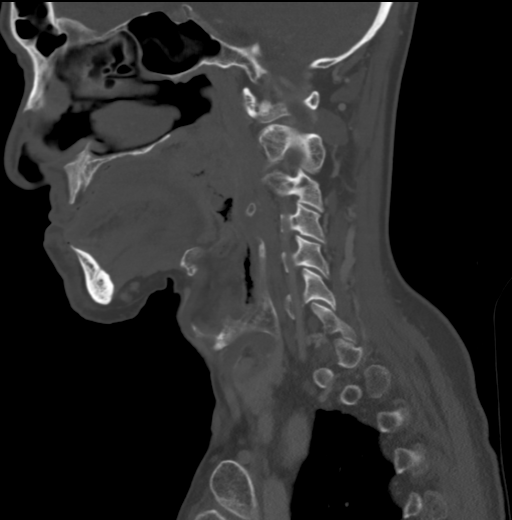
[im 51/101  soft-tissue]
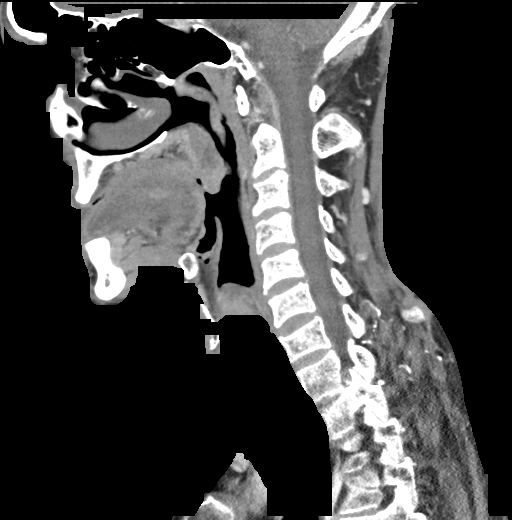
[im 51/101  bone]
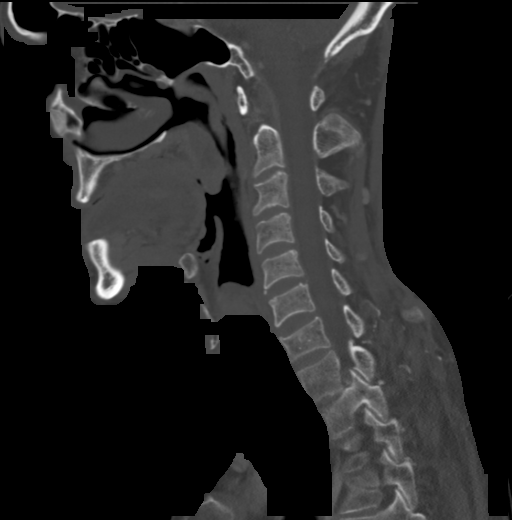
[im 59/101  bone]
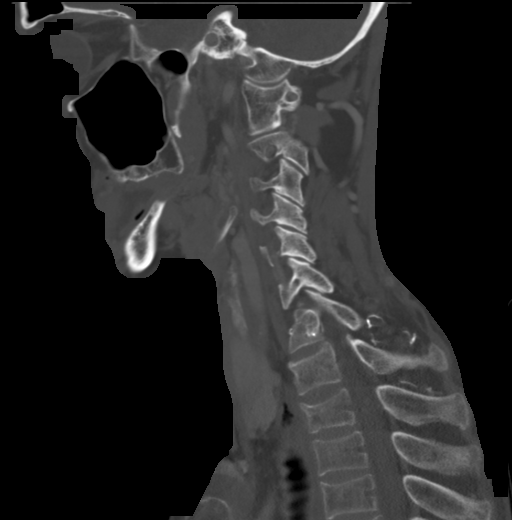
[im 67/101  bone]
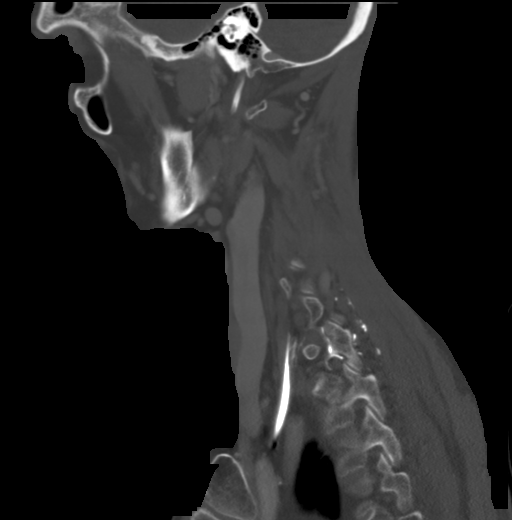

[16 of 33 positions shown; findings below may reference images not displayed]

FINDINGS: PHARYNX AND LARYNX: Redemonstration of large tonsillar carcinoma
filling much of the oropharynx, crossing midline and measuring at
least 3.5 x 4.2 cm, increased since 02/11/2021. Asymmetric
appearance of the vocal folds unchanged. Normal epiglottis.

SALIVARY GLANDS: Normal parotid, submandibular and sublingual
glands.

THYROID: Normal.

LYMPH NODES: Markedly increased size of right cervical confluent
adenopathy central necrosis. The largest area measures 4.2 x 5.6 cm,
previously 3.9 x 3.8 cm. Slightly increased size of 12 mm left level
2 lymph node.

VASCULAR: Invasion of right internal jugular vein by the masslike
adenopathy in the right neck as before.

LIMITED INTRACRANIAL: Normal.

VISUALIZED ORBITS: Normal.

MASTOIDS AND VISUALIZED PARANASAL SINUSES: No fluid levels or
advanced mucosal thickening. No mastoid effusion.

SKELETON: No bony spinal canal stenosis. No lytic or blastic
lesions.

UPPER CHEST: Negative.

OTHER: None.
IMPRESSION: 1. Increased size of large right palatine tonsillar carcinoma
filling much of the oropharynx, crossing midline and measuring at
least 3.5 x 4.2 cm.
2. Markedly increased size of confluent right cervical necrotic
adenopathy.
3. Slightly increased size of 12 mm left level 2 lymph node, also
likely metastatic disease.
4. Invasion of the right internal jugular vein by the masslike
adenopathy in the right neck, as before.

## 2022-07-19 IMAGING — DX DG CHEST 1V PORT
1 series · 2 of 2 positions shown · non-contrast
Comparison: CT 02/11/1999 22, radiograph [DATE] 02/11/2021

CLINICAL DATA: Shortness of breath

EXAM:
PORTABLE CHEST 1 VIEW

[Series 1: chest ap · 0.14mm/px · 2 of 2 slices shown]
[im 1/2]
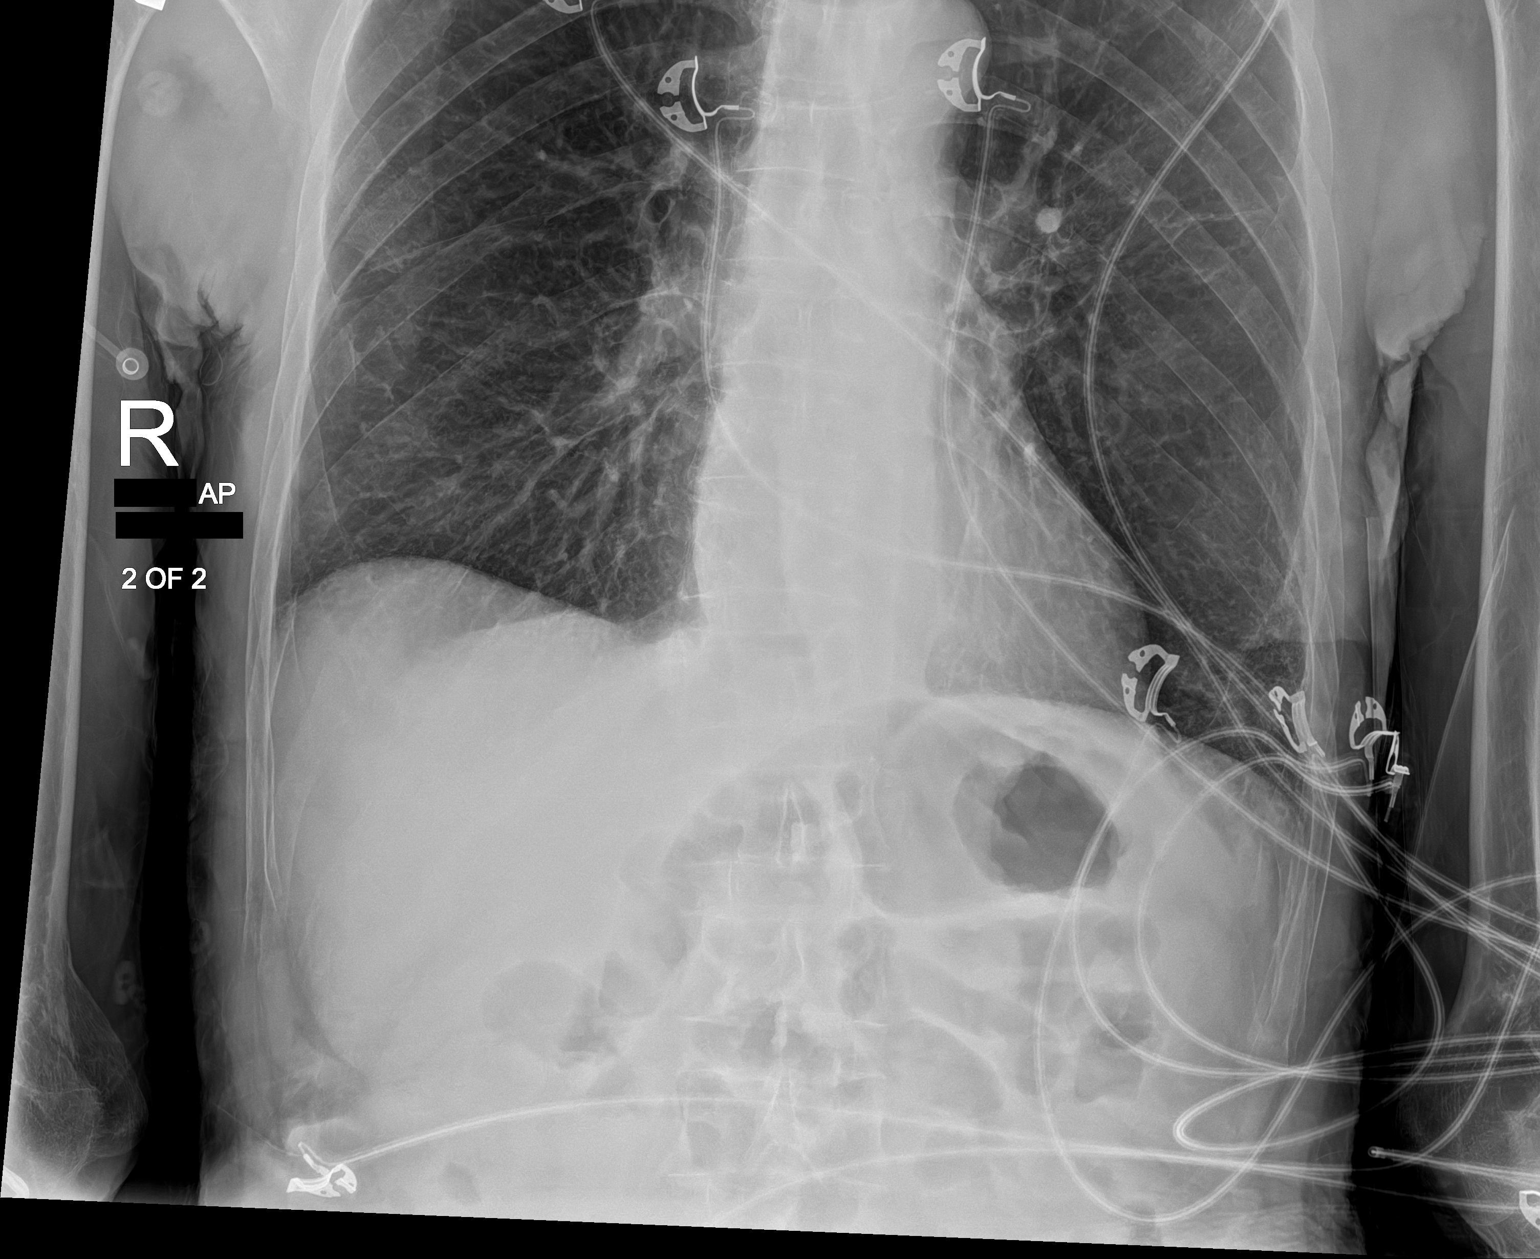
[im 2/2]
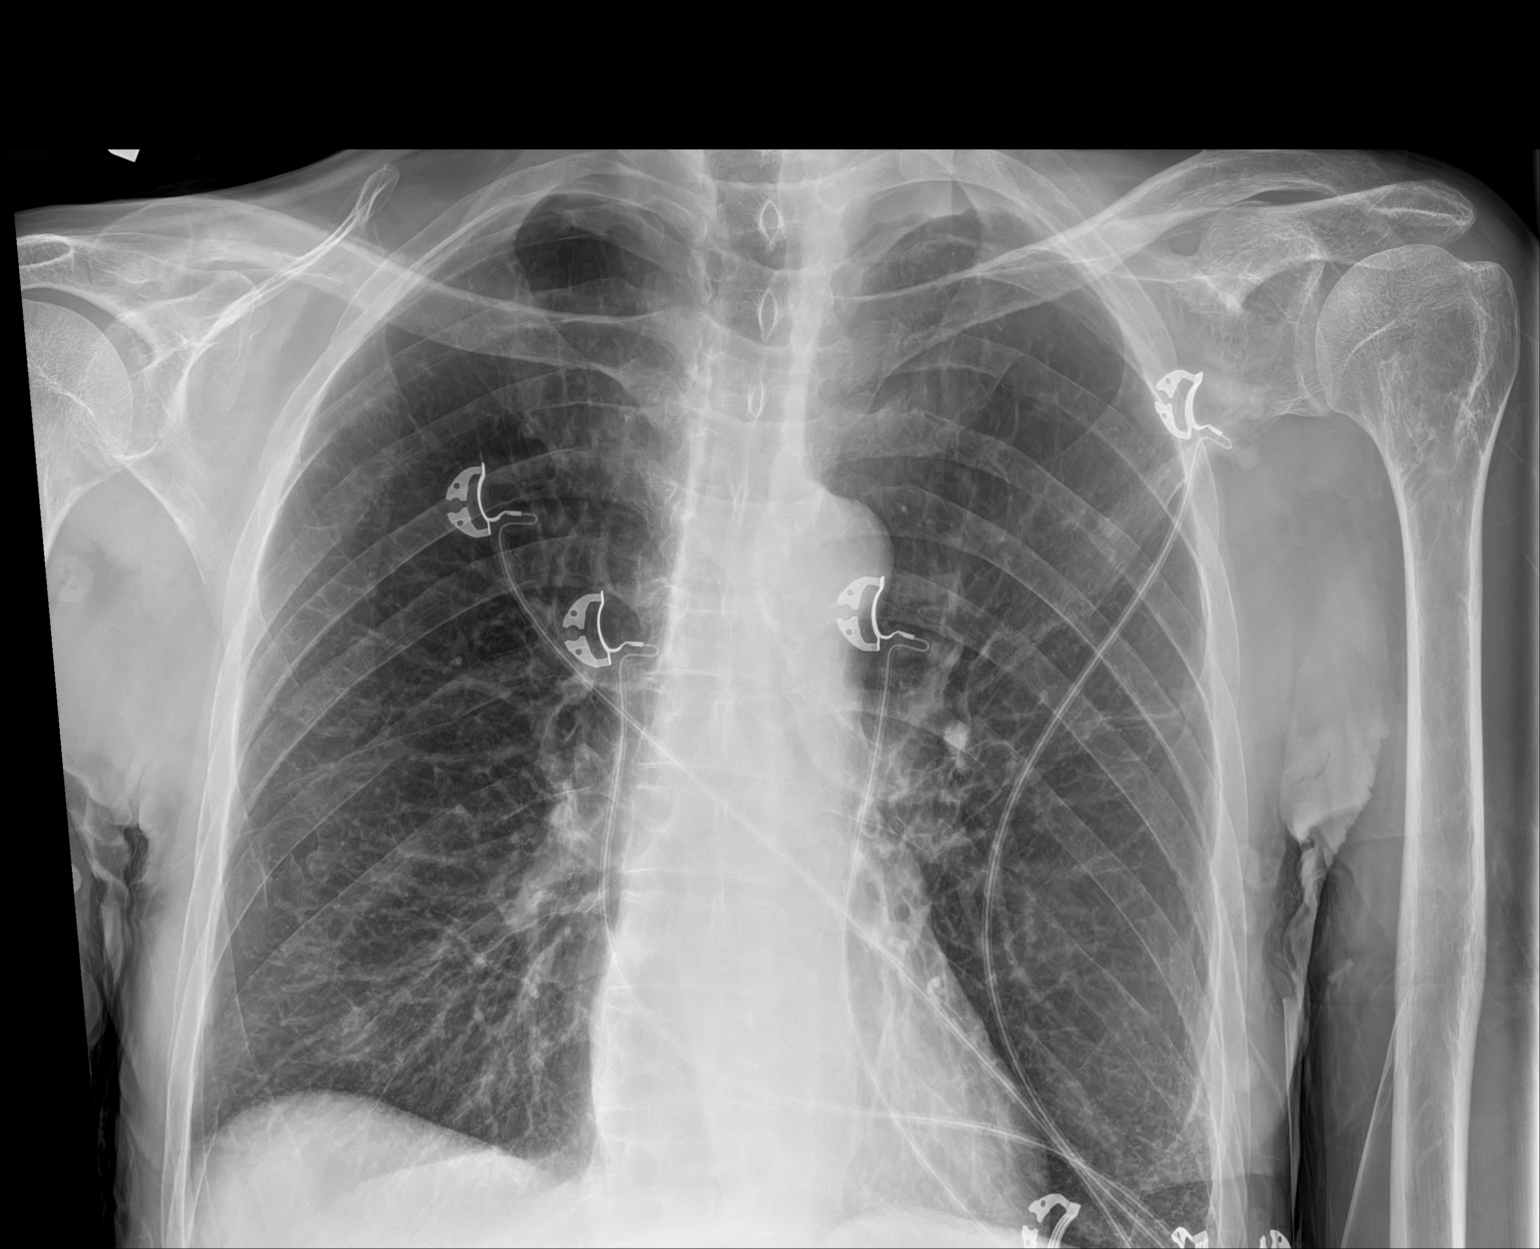

[2 of 2 positions shown; findings below may reference images not displayed]

FINDINGS: Hyperinflation with chronic interstitial opacity and bronchiectasis.
No acute airspace disease. Emphysema. Normal cardiomediastinal
silhouette. No pneumothorax.
IMPRESSION: No active disease.  Hyperinflation with emphysema
